# Patient Record
Sex: Male | Born: 1945 | Race: White | Hispanic: No | Smoking: Former smoker
Health system: Southern US, Community
[De-identification: ages and names within clinical notes are randomized; demographics above are authoritative.]

## PROBLEM LIST (undated history)

## (undated) DIAGNOSIS — F329 Major depressive disorder, single episode, unspecified: Secondary | ICD-10-CM

## (undated) DIAGNOSIS — I10 Essential (primary) hypertension: Secondary | ICD-10-CM

## (undated) DIAGNOSIS — K219 Gastro-esophageal reflux disease without esophagitis: Secondary | ICD-10-CM

## (undated) DIAGNOSIS — E785 Hyperlipidemia, unspecified: Secondary | ICD-10-CM

## (undated) DIAGNOSIS — R51 Headache: Secondary | ICD-10-CM

## (undated) DIAGNOSIS — G43909 Migraine, unspecified, not intractable, without status migrainosus: Secondary | ICD-10-CM

## (undated) DIAGNOSIS — M1990 Unspecified osteoarthritis, unspecified site: Secondary | ICD-10-CM

## (undated) DIAGNOSIS — T7840XA Allergy, unspecified, initial encounter: Secondary | ICD-10-CM

## (undated) DIAGNOSIS — J329 Chronic sinusitis, unspecified: Secondary | ICD-10-CM

## (undated) DIAGNOSIS — J45909 Unspecified asthma, uncomplicated: Secondary | ICD-10-CM

## (undated) DIAGNOSIS — N400 Enlarged prostate without lower urinary tract symptoms: Secondary | ICD-10-CM

## (undated) DIAGNOSIS — F32A Depression, unspecified: Secondary | ICD-10-CM

## (undated) HISTORY — DX: Chronic sinusitis, unspecified: J32.9

## (undated) HISTORY — DX: Unspecified osteoarthritis, unspecified site: M19.90

## (undated) HISTORY — DX: Hyperlipidemia, unspecified: E78.5

## (undated) HISTORY — DX: Major depressive disorder, single episode, unspecified: F32.9

## (undated) HISTORY — DX: Headache: R51

## (undated) HISTORY — DX: Depression, unspecified: F32.A

## (undated) HISTORY — DX: Unspecified asthma, uncomplicated: J45.909

## (undated) HISTORY — DX: Essential (primary) hypertension: I10

## (undated) HISTORY — DX: Gastro-esophageal reflux disease without esophagitis: K21.9

## (undated) HISTORY — DX: Benign prostatic hyperplasia without lower urinary tract symptoms: N40.0

## (undated) HISTORY — DX: Migraine, unspecified, not intractable, without status migrainosus: G43.909

## (undated) HISTORY — DX: Allergy, unspecified, initial encounter: T78.40XA

---
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
## 2003-10-16 HISTORY — PX: BACK SURGERY: SHX140

## 2011-07-09 ENCOUNTER — Ambulatory Visit (INDEPENDENT_AMBULATORY_CARE_PROVIDER_SITE_OTHER): Payer: Medicare Other | Admitting: Internal Medicine

## 2011-07-09 ENCOUNTER — Encounter: Payer: Self-pay | Admitting: Internal Medicine

## 2011-07-09 VITALS — BP 118/82 | HR 76 | Temp 98.2°F | Ht 70.0 in | Wt 258.2 lb

## 2011-07-09 DIAGNOSIS — E785 Hyperlipidemia, unspecified: Secondary | ICD-10-CM | POA: Insufficient documentation

## 2011-07-09 DIAGNOSIS — G8929 Other chronic pain: Secondary | ICD-10-CM | POA: Insufficient documentation

## 2011-07-09 DIAGNOSIS — R972 Elevated prostate specific antigen [PSA]: Secondary | ICD-10-CM | POA: Insufficient documentation

## 2011-07-09 DIAGNOSIS — E119 Type 2 diabetes mellitus without complications: Secondary | ICD-10-CM | POA: Insufficient documentation

## 2011-07-09 DIAGNOSIS — M549 Dorsalgia, unspecified: Secondary | ICD-10-CM

## 2011-07-09 HISTORY — DX: Elevated prostate specific antigen (PSA): R97.20

## 2011-07-09 LAB — COMPREHENSIVE METABOLIC PANEL
ALT: 41 U/L (ref 0–53)
CO2: 26 mEq/L (ref 19–32)
Calcium: 9.6 mg/dL (ref 8.4–10.5)
Chloride: 102 mEq/L (ref 96–112)
GFR: 75.34 mL/min (ref >60.00)
Sodium: 141 mEq/L (ref 135–145)
Total Protein: 7.7 g/dL (ref 6.0–8.3)

## 2011-07-09 LAB — HEMOGLOBIN A1C: Hgb A1c MFr Bld: 6.7 % — ABNORMAL HIGH (ref 4.6–6.5)

## 2011-07-09 LAB — LDL CHOLESTEROL, DIRECT: Direct LDL: 91.3 mg/dL

## 2011-07-09 MED ORDER — GLIMEPIRIDE 4 MG PO TABS: 4.0000 mg | ORAL_TABLET | Freq: Every day | ORAL | Status: DC

## 2011-07-09 NOTE — Assessment & Plan Note (Signed)
H/o elevated PSA in past, will check with labs today.

## 2011-07-09 NOTE — Assessment & Plan Note (Signed)
Will get records on previous evaluation and management.

## 2011-07-09 NOTE — Assessment & Plan Note (Signed)
Will check lipids and LFTs with labs. Follow up 3 months.

## 2011-07-09 NOTE — Assessment & Plan Note (Signed)
Blood sugars well-controlled per patient. Will check A1c with labs. On ACE inhibitor and statin. Eye exam and foot exam are up-to-date. Followup 3 months.

## 2011-07-09 NOTE — Progress Notes (Signed)
Subjective:    Patient ID: Dakota Gonzalez, male    DOB: 20-Aug-1945, 66 y.o.   MRN: [id number]  HPI 66 year old male with history of diabetes, hypertension, hyperlipidemia presents to establish care. He reports that he is generally feeling well. In regards to his diabetes, he reports that blood sugars are well controlled with current medications. He did not bring a record of his blood sugars today. He is unsure of his last hemoglobin A1c. He reports full compliance with his medications.  He also notes a recent history of elevated PSA. He notes that he was supposed to have this repeated to recheck level.  Outpatient Encounter Prescriptions as of 07/09/2011  Medication Sig Dispense Refill  . aspirin 81 MG tablet Take 81 mg by mouth daily.      . Coenzyme Q10 (COQ-10) 100 MG CAPS Take 1 tablet by mouth daily.      Marland Kitchen glimepiride (AMARYL) 4 MG tablet Take 1 tablet (4 mg total) by mouth daily before breakfast.  90 tablet  4  . HYDROcodone-acetaminophen (VICODIN) 5-500 MG per tablet Take 1 tablet by mouth every 6 (six) hours as needed.      Marland Kitchen lisinopril (PRINIVIL,ZESTRIL) 40 MG tablet Take 40 mg by mouth daily.      . Multiple Vitamins-Minerals (CENTRUM SILVER PO) Take 1 tablet by mouth daily.      . Probiotic Product (PROBIOTIC + OMEGA-3 PO) Take 1 tablet by mouth daily.      . simvastatin (ZOCOR) 40 MG tablet Take 40 mg by mouth every evening.        Review of Systems  Constitutional: Negative for fever, chills, activity change, appetite change, fatigue and unexpected weight change.  Eyes: Negative for visual disturbance.  Respiratory: Negative for cough and shortness of breath.   Cardiovascular: Negative for chest pain, palpitations and leg swelling.  Gastrointestinal: Negative for abdominal pain and abdominal distention.  Genitourinary: Negative for dysuria, urgency and difficulty urinating.  Musculoskeletal: Negative for arthralgias and gait problem.  Skin: Negative for color change and  rash.  Hematological: Negative for adenopathy.  Psychiatric/Behavioral: Negative for disturbed wake/sleep cycle and dysphoric mood. The patient is not nervous/anxious.        Objective:   Physical Exam  Constitutional: He is oriented to person, place, and time. He appears well-developed and well-nourished. No distress.  HENT:  Head: Normocephalic and atraumatic.  Right Ear: External ear normal.  Left Ear: External ear normal.  Nose: Nose normal.  Mouth/Throat: Oropharynx is clear and moist. No oropharyngeal exudate.  Eyes: Conjunctivae and EOM are normal. Pupils are equal, round, and reactive to light. Right eye exhibits no discharge. Left eye exhibits no discharge. No scleral icterus.  Neck: Normal range of motion. Neck supple. No tracheal deviation present. No thyromegaly present.  Cardiovascular: Normal rate, regular rhythm and normal heart sounds.  Exam reveals no gallop and no friction rub.   No murmur heard. Pulmonary/Chest: Effort normal and breath sounds normal. No respiratory distress. He has no wheezes. He has no rales. He exhibits no tenderness.  Abdominal: Soft. Bowel sounds are normal. He exhibits no distension and no mass. There is no tenderness. There is no guarding.  Musculoskeletal: Normal range of motion. He exhibits no edema.  Lymphadenopathy:    He has no cervical adenopathy.  Neurological: He is alert and oriented to person, place, and time. No cranial nerve deficit. Coordination normal.  Skin: Skin is warm and dry. No rash noted. He is not diaphoretic. No erythema. No  pallor.  Psychiatric: He has a normal mood and affect. His behavior is normal. Judgment and thought content normal.          Assessment & Plan:

## 2011-07-31 ENCOUNTER — Other Ambulatory Visit: Payer: Self-pay | Admitting: *Deleted

## 2011-07-31 MED ORDER — GLUCOSE BLOOD VI STRP: ORAL_STRIP | Status: DC

## 2011-09-06 ENCOUNTER — Other Ambulatory Visit: Payer: Self-pay | Admitting: *Deleted

## 2011-09-06 MED ORDER — SIMVASTATIN 40 MG PO TABS: 40.0000 mg | ORAL_TABLET | Freq: Every evening | ORAL | Status: DC

## 2011-10-17 ENCOUNTER — Encounter: Payer: Self-pay | Admitting: Internal Medicine

## 2011-10-17 ENCOUNTER — Ambulatory Visit (INDEPENDENT_AMBULATORY_CARE_PROVIDER_SITE_OTHER): Payer: Medicare Other | Admitting: Internal Medicine

## 2011-10-17 VITALS — BP 130/80 | HR 65 | Temp 97.9°F | Ht 70.0 in | Wt 242.8 lb

## 2011-10-17 DIAGNOSIS — Z0000 Encounter for general adult medical examination without abnormal findings: Secondary | ICD-10-CM

## 2011-10-17 DIAGNOSIS — R17 Unspecified jaundice: Secondary | ICD-10-CM

## 2011-10-17 DIAGNOSIS — Z23 Encounter for immunization: Secondary | ICD-10-CM

## 2011-10-17 DIAGNOSIS — E119 Type 2 diabetes mellitus without complications: Secondary | ICD-10-CM

## 2011-10-17 DIAGNOSIS — R972 Elevated prostate specific antigen [PSA]: Secondary | ICD-10-CM

## 2011-10-17 LAB — COMPREHENSIVE METABOLIC PANEL
ALT: 34 U/L (ref 0–53)
Albumin: 4.4 g/dL (ref 3.5–5.2)
BUN: 16 mg/dL (ref 6–23)
CO2: 29 mEq/L (ref 19–32)
Calcium: 9.8 mg/dL (ref 8.4–10.5)
Chloride: 104 mEq/L (ref 96–112)
Creatinine, Ser: 1.1 mg/dL (ref 0.4–1.5)
GFR: 74.45 mL/min (ref >60.00)
Potassium: 4.2 mEq/L (ref 3.5–5.1)

## 2011-10-17 LAB — HEMOGLOBIN A1C: Hgb A1c MFr Bld: 5.9 % (ref 4.6–6.5)

## 2011-10-17 NOTE — Progress Notes (Signed)
Subjective:    Patient ID: Dakota Gonzalez, male    DOB: 26-Mar-1945, 66 y.o.   MRN: [id number]  HPI The patient is here for annual Medicare wellness examination and management of other chronic and acute problems.   The risk factors are reflected in the social history.  The roster of all physicians providing medical care to patient - is listed in the Snapshot section of the chart.  Activities of daily living:  The patient is 100% independent in all ADLs: dressing, toileting, feeding as well as independent mobility  Home safety : The patient has smoke detectors in the home. They wear seatbelts.  There are no firearms at home. There is no violence in the home.   There is no risks for hepatitis, STDs or HIV. There is no history of blood transfusion. They have no travel history to infectious disease endemic areas of the world.  The patient has not seen their dentist in the last six month.  Encouraged pt to establish care with dentist. They have seen their eye doctor in the last year. ([facility] Fransico Michael at [facility]) No issues with hearing They have deferred audiologic testing in the last year.    They do not  have excessive sun exposure. Discussed the need for sun protection: hats, long sleeves and use of sunscreen if there is significant sun exposure.   Diet: the importance of a healthy diet is discussed. They do have a healthy diet.  The benefits of regular aerobic exercise were discussed. He uses cardio machines at home on regular basis, and walks.  Depression screen: there are no signs or vegative symptoms of depression- irritability, change in appetite, anhedonia, sadness/tearfullness.  Cognitive assessment: the patient manages all their financial and personal affairs and is actively engaged. They could relate day,date,year and events.  The following portions of the patient's history were reviewed and updated as appropriate: allergies, current medications, past family history, past  medical history,  past surgical history, past social history  and problem list.  Visual acuity was not assessed per patient preference since he has regular follow up with her ophthalmologist. Hearing and body mass index were assessed and reviewed.   During the course of the visit the patient was educated and counseled about appropriate screening and preventive services including : fall prevention , diabetes screening, nutrition counseling, colorectal cancer screening, and recommended immunizations.    In regards to diabetes, patient reports that blood sugars have been lower since he has lost nearly 20 pounds. He occasionally has blood sugars less than 60. He has been taking his glyburide 4 mg daily.   Outpatient Encounter Prescriptions as of 10/17/2011  Medication Sig Dispense Refill  . aspirin 81 MG tablet Take 81 mg by mouth daily.      . Coenzyme Q10 (COQ-10) 100 MG CAPS Take 1 tablet by mouth daily.      Marland Kitchen glimepiride (AMARYL) 4 MG tablet Take 1 tablet (4 mg total) by mouth daily before breakfast.  90 tablet  4  . glucose blood test strip One Touch Ultra Blue Test Strips- use one twice daily as needed to check blood glucose level.  50 each  11  . HYDROcodone-acetaminophen (VICODIN) 5-500 MG per tablet Take 1 tablet by mouth every 6 (six) hours as needed.      Marland Kitchen lisinopril (PRINIVIL,ZESTRIL) 40 MG tablet Take 40 mg by mouth daily.      . Multiple Vitamins-Minerals (CENTRUM SILVER PO) Take 1 tablet by mouth daily.      Marland Kitchen  Probiotic Product (PROBIOTIC + OMEGA-3 PO) Take 1 tablet by mouth daily.      . simvastatin (ZOCOR) 40 MG tablet Take 1 tablet (40 mg total) by mouth every evening.  90 tablet  3   BP 130/80  Pulse 65  Temp 97.9 F (36.6 C) (Oral)  Ht 5\' 10"  (1.778 m)  Wt 242 lb 12 oz (110.111 kg)  BMI 34.83 kg/m2  SpO2 96%  Review of Systems  Constitutional: Negative for fever, chills, activity change, appetite change, fatigue and unexpected weight change.  Eyes: Negative for visual  disturbance.  Respiratory: Negative for cough and shortness of breath.   Cardiovascular: Negative for chest pain, palpitations and leg swelling.  Gastrointestinal: Negative for abdominal pain and abdominal distention.  Genitourinary: Negative for dysuria, urgency and difficulty urinating.  Musculoskeletal: Negative for arthralgias and gait problem.  Skin: Negative for color change and rash.  Hematological: Negative for adenopathy.  Psychiatric/Behavioral: Negative for disturbed wake/sleep cycle and dysphoric mood. The patient is not nervous/anxious.        Objective:   Physical Exam  Constitutional: He is oriented to person, place, and time. He appears well-developed and well-nourished. No distress.  HENT:  Head: Normocephalic and atraumatic.  Right Ear: External ear normal.  Left Ear: External ear normal.  Nose: Nose normal.  Mouth/Throat: Oropharynx is clear and moist. No oropharyngeal exudate.  Eyes: Conjunctivae normal and EOM are normal. Pupils are equal, round, and reactive to light. Right eye exhibits no discharge. Left eye exhibits no discharge. No scleral icterus.  Neck: Normal range of motion. Neck supple. No tracheal deviation present. No thyromegaly present.  Cardiovascular: Normal rate, regular rhythm and normal heart sounds.  Exam reveals no gallop and no friction rub.   No murmur heard. Pulmonary/Chest: Effort normal and breath sounds normal. No respiratory distress. He has no wheezes. He has no rales. He exhibits no tenderness.  Abdominal: Soft. Bowel sounds are normal. He exhibits no distension and no mass. There is no tenderness. There is no rebound and no guarding.  Musculoskeletal: Normal range of motion. He exhibits no edema.  Lymphadenopathy:    He has no cervical adenopathy.  Neurological: He is alert and oriented to person, place, and time. No cranial nerve deficit. Coordination normal.  Skin: Skin is warm and dry. No rash noted. He is not diaphoretic. No  erythema. No pallor.  Psychiatric: He has a normal mood and affect. His behavior is normal. Judgment and thought content normal.          Assessment & Plan:

## 2011-10-17 NOTE — Addendum Note (Signed)
Addended by: Ronna Polio A on: 10/17/2011 06:55 PM   Modules accepted: Orders

## 2011-10-17 NOTE — Addendum Note (Signed)
Addended by: Gilmer Mor on: 10/17/2011 01:48 PM   Modules accepted: Orders

## 2011-10-17 NOTE — Assessment & Plan Note (Signed)
Blood sugars have been well-controlled. Will continue glyburide, but may reduce dose to 2 mg daily if patient has persistent low sugars less than 60. Will check A1c with labs today.

## 2011-10-17 NOTE — Assessment & Plan Note (Signed)
General exam normal today. Health care maintenance is UTD except for flu and pneumonia vaccines which were given. Information given on living will and HCPOA.  Recommended that pt establish care with dentist locally. Will check CMP and A1c today as well as PSA as this was slightly elevated on check in 06/2011. Follow up 3 months and prn.

## 2011-10-17 NOTE — Assessment & Plan Note (Signed)
PSA elevated at 4.3 in 06/2011.  This is consistent with previous levels. Will plan to repeat PSA today.

## 2011-10-18 LAB — PSA, TOTAL AND FREE
PSA, Free Pct: 31 % (ref >25)
PSA, Free: 1.69 ng/mL

## 2011-10-24 ENCOUNTER — Ambulatory Visit: Payer: Self-pay | Admitting: Internal Medicine

## 2011-10-25 ENCOUNTER — Telehealth: Payer: Self-pay | Admitting: Internal Medicine

## 2011-10-25 DIAGNOSIS — K7689 Other specified diseases of liver: Secondary | ICD-10-CM

## 2011-10-25 DIAGNOSIS — R935 Abnormal findings on diagnostic imaging of other abdominal regions, including retroperitoneum: Secondary | ICD-10-CM

## 2011-10-25 NOTE — Telephone Encounter (Signed)
Ultrasound of the right upper quadrant showed possible cyst in the liver. I would like to get CT abdomen for further evaluation. If pt agrees, we can set this up.

## 2011-10-25 NOTE — Telephone Encounter (Signed)
Patient advised as instructed via telephone, I will ask [name] to set up CT since Dakota Gonzalez is out of the office today.

## 2011-10-25 NOTE — Addendum Note (Signed)
Addended by: Gilmer Mor on: 10/25/2011 01:53 PM   Modules accepted: Orders

## 2011-10-25 NOTE — Addendum Note (Signed)
Addended by: Ronna Polio A on: 10/25/2011 02:27 PM   Modules accepted: Orders

## 2011-10-29 ENCOUNTER — Telehealth: Payer: Self-pay | Admitting: Internal Medicine

## 2011-10-29 NOTE — Telephone Encounter (Signed)
Pt called wanting to know about setting up a CT Scan ??

## 2011-10-30 ENCOUNTER — Telehealth: Payer: Self-pay | Admitting: Internal Medicine

## 2011-10-30 ENCOUNTER — Encounter: Payer: Self-pay | Admitting: Internal Medicine

## 2011-10-30 NOTE — Telephone Encounter (Signed)
Opened in error

## 2011-10-30 NOTE — Telephone Encounter (Signed)
Pt spouse called back checking on ct scan of pt liver. Best number to call [phone]

## 2011-11-01 NOTE — Telephone Encounter (Signed)
Spoke with patient via telephone, he stated that he has already had the ultrasound and he is aware that his CT is scheduled for 11/02/2011.

## 2011-11-02 ENCOUNTER — Ambulatory Visit: Payer: Self-pay | Admitting: Internal Medicine

## 2011-11-05 ENCOUNTER — Telehealth: Payer: Self-pay | Admitting: Internal Medicine

## 2011-11-05 NOTE — Telephone Encounter (Signed)
CT of the abdomen showed cysts in the liver and kidneys, but no acute abnormalities. There was diverticulosis noted in the sigmoid colon, but no acute inflammation

## 2011-11-07 ENCOUNTER — Encounter: Payer: Self-pay | Admitting: Internal Medicine

## 2011-11-21 ENCOUNTER — Telehealth: Payer: Self-pay | Admitting: Internal Medicine

## 2011-11-21 MED ORDER — HYDROCODONE-ACETAMINOPHEN 5-500 MG PO TABS: 1.0000 | ORAL_TABLET | Freq: Four times a day (QID) | ORAL | Status: DC | PRN

## 2011-11-21 NOTE — Telephone Encounter (Signed)
Pt is needing refill on his pain meds. He uses [facility] in [place].

## 2011-11-21 NOTE — Telephone Encounter (Signed)
We can refill Vicodin x 1 month.  He should know that Vicodin will be changing to schedule 2 narcotic per new FDA recommendations. I am no longer going to fill prescriptions for chronic narcotics.  He will need to establish care at a pain clinic for chronic narcotic management.

## 2011-11-21 NOTE — Telephone Encounter (Signed)
Rx called to [facility], patients spouse advised as instructed.  She will relay the message to Hershal and have him give [place] a call back if he wants [place] to set up appt with pain clinic.

## 2011-12-05 ENCOUNTER — Telehealth: Payer: Self-pay | Admitting: Internal Medicine

## 2011-12-05 NOTE — Telephone Encounter (Signed)
Refill request for lisinopril 40 mg  Sig: take one-half tablet by mouth twice daily Qty: 90

## 2011-12-08 ENCOUNTER — Encounter: Payer: Self-pay | Admitting: Internal Medicine

## 2011-12-10 ENCOUNTER — Other Ambulatory Visit: Payer: Self-pay | Admitting: General Practice

## 2011-12-10 ENCOUNTER — Other Ambulatory Visit: Payer: Self-pay | Admitting: Internal Medicine

## 2011-12-10 MED ORDER — LISINOPRIL 40 MG PO TABS: 40.0000 mg | ORAL_TABLET | Freq: Every day | ORAL | Status: DC

## 2012-01-16 HISTORY — PX: HEMANGIOMA EXCISION: SHX1734

## 2012-01-16 HISTORY — PX: PROSTATE BIOPSY: SHX241

## 2012-01-21 ENCOUNTER — Ambulatory Visit (INDEPENDENT_AMBULATORY_CARE_PROVIDER_SITE_OTHER): Payer: Medicare Other | Admitting: Internal Medicine

## 2012-01-21 ENCOUNTER — Encounter: Payer: Self-pay | Admitting: Internal Medicine

## 2012-01-21 VITALS — BP 140/80 | HR 84 | Temp 98.4°F | Ht 70.0 in | Wt 240.5 lb

## 2012-01-21 DIAGNOSIS — G2581 Restless legs syndrome: Secondary | ICD-10-CM

## 2012-01-21 DIAGNOSIS — E119 Type 2 diabetes mellitus without complications: Secondary | ICD-10-CM

## 2012-01-21 DIAGNOSIS — E785 Hyperlipidemia, unspecified: Secondary | ICD-10-CM

## 2012-01-21 DIAGNOSIS — R972 Elevated prostate specific antigen [PSA]: Secondary | ICD-10-CM

## 2012-01-21 LAB — COMPREHENSIVE METABOLIC PANEL
Albumin: 4.6 g/dL (ref 3.5–5.2)
Alkaline Phosphatase: 46 U/L (ref 39–117)
CO2: 28 mEq/L (ref 19–32)
GFR: 72.8 mL/min (ref >60.00)
Glucose, Bld: 116 mg/dL — ABNORMAL HIGH (ref 70–99)
Potassium: 4.3 mEq/L (ref 3.5–5.1)
Sodium: 140 mEq/L (ref 135–145)
Total Protein: 8.2 g/dL (ref 6.0–8.3)

## 2012-01-21 LAB — LIPID PANEL
Cholesterol: 143 mg/dL (ref 0–200)
LDL Cholesterol: 75 mg/dL (ref 0–99)

## 2012-01-21 LAB — HEMOGLOBIN A1C: Hgb A1c MFr Bld: 6.1 % (ref 4.6–6.5)

## 2012-01-21 LAB — MICROALBUMIN / CREATININE URINE RATIO: Creatinine,U: 161.6 mg/dL

## 2012-01-21 MED ORDER — GABAPENTIN 100 MG PO CAPS: 100.0000 mg | ORAL_CAPSULE | Freq: Every evening | ORAL | Status: DC | PRN

## 2012-01-21 NOTE — Progress Notes (Signed)
Subjective:    Patient ID: Dakota Gonzalez, male    DOB: Dec 20, 1945, 67 y.o.   MRN: [id number]  HPI 67 year old male with history of diabetes, obesity, hypertension, hyperlipidemia, diabetic neuropathy presents for followup. He reports that he is generally doing well. Blood sugars have been well-controlled. He reports compliance with his medications. He notes some dietary indiscretion over the holidays, but has been making effort to lose weight and has lost another 2 pounds. In regards to chronic back pain, he reports that symptoms have been controlled with use of Vicodin typically twice per month. He continues to have issues with restless legs in the evenings. This sometimes limits his ability to fall sleep.  Outpatient Encounter Prescriptions as of 01/21/2012  Medication Sig Dispense Refill  . aspirin 81 MG tablet Take 81 mg by mouth daily.      . Coenzyme Q10 (COQ-10) 100 MG CAPS Take 1 tablet by mouth daily.      Marland Kitchen glimepiride (AMARYL) 4 MG tablet Take 1 tablet (4 mg total) by mouth daily before breakfast.  90 tablet  4  . glucose blood test strip One Touch Ultra Blue Test Strips- use one twice daily as needed to check blood glucose level.  50 each  11  . HYDROcodone-acetaminophen (VICODIN) 5-500 MG per tablet Take 1 tablet by mouth every 6 (six) hours as needed.  30 tablet  0  . lisinopril (PRINIVIL,ZESTRIL) 40 MG tablet Take 1 tablet (40 mg total) by mouth daily.  30 tablet  6  . Multiple Vitamins-Minerals (CENTRUM SILVER PO) Take 1 tablet by mouth daily.      . Probiotic Product (PROBIOTIC + OMEGA-3 PO) Take 1 tablet by mouth daily.      . simvastatin (ZOCOR) 40 MG tablet Take 1 tablet (40 mg total) by mouth every evening.  90 tablet  3  . gabapentin (NEURONTIN) 100 MG capsule Take 1 capsule (100 mg total) by mouth at bedtime as needed.  30 capsule  3   BP 140/80  Pulse 84  Temp 98.4 F (36.9 C) (Oral)  Ht 5\' 10"  (1.778 m)  Wt 240 lb 8 oz (109.09 kg)  BMI 34.51 kg/m2  SpO2  96%  Review of Systems  Constitutional: Negative for fever, chills, activity change, appetite change, fatigue and unexpected weight change.  Eyes: Negative for visual disturbance.  Respiratory: Negative for cough and shortness of breath.   Cardiovascular: Negative for chest pain, palpitations and leg swelling.  Gastrointestinal: Negative for abdominal pain and abdominal distention.  Genitourinary: Negative for dysuria, urgency and difficulty urinating.  Musculoskeletal: Positive for myalgias, back pain and arthralgias. Negative for gait problem.  Skin: Negative for color change and rash.  Hematological: Negative for adenopathy.  Psychiatric/Behavioral: Negative for sleep disturbance and dysphoric mood. The patient is not nervous/anxious.        Objective:   Physical Exam  Constitutional: He is oriented to person, place, and time. He appears well-developed and well-nourished. No distress.  HENT:  Head: Normocephalic and atraumatic.  Right Ear: External ear normal.  Left Ear: External ear normal.  Nose: Nose normal.  Mouth/Throat: Oropharynx is clear and moist. No oropharyngeal exudate.  Eyes: Conjunctivae normal and EOM are normal. Pupils are equal, round, and reactive to light. Right eye exhibits no discharge. Left eye exhibits no discharge. No scleral icterus.  Neck: Normal range of motion. Neck supple. No tracheal deviation present. No thyromegaly present.  Cardiovascular: Normal rate, regular rhythm and normal heart sounds.  Exam reveals no  gallop and no friction rub.   No murmur heard. Pulmonary/Chest: Effort normal and breath sounds normal. No respiratory distress. He has no wheezes. He has no rales. He exhibits no tenderness.  Musculoskeletal: Normal range of motion. He exhibits no edema.  Lymphadenopathy:    He has no cervical adenopathy.  Neurological: He is alert and oriented to person, place, and time. No cranial nerve deficit. Coordination normal.  Skin: Skin is warm and  dry. No rash noted. He is not diaphoretic. No erythema. No pallor.  Psychiatric: He has a normal mood and affect. His behavior is normal. Judgment and thought content normal.          Assessment & Plan:

## 2012-01-21 NOTE — Assessment & Plan Note (Signed)
Will recheck PSA with labs today. 

## 2012-01-21 NOTE — Assessment & Plan Note (Signed)
Patient reports good control of blood sugars. Will check A1c with labs today. Continue Amaryl. Encouraged continued efforts at diet and exercise. Followup in 3 months or sooner as needed.

## 2012-01-21 NOTE — Assessment & Plan Note (Signed)
Symptoms persistent. Will try adding Neurontin at bedtime to see if any improvement. Pt will email with update later this week. Will likely need to titrate dose up over next few weeks.

## 2012-01-21 NOTE — Assessment & Plan Note (Signed)
Will check lipids and LFTs with labs today. Follow up in 3 months and prn.

## 2012-01-22 ENCOUNTER — Encounter: Payer: Self-pay | Admitting: Internal Medicine

## 2012-01-22 DIAGNOSIS — R972 Elevated prostate specific antigen [PSA]: Secondary | ICD-10-CM

## 2012-01-22 LAB — PSA, TOTAL AND FREE
PSA, Free: 1.91 ng/mL
PSA: 6.14 ng/mL — ABNORMAL HIGH (ref <=4.00)

## 2012-02-11 DIAGNOSIS — N411 Chronic prostatitis: Secondary | ICD-10-CM | POA: Insufficient documentation

## 2012-02-11 DIAGNOSIS — N138 Other obstructive and reflux uropathy: Secondary | ICD-10-CM | POA: Insufficient documentation

## 2012-02-11 DIAGNOSIS — R339 Retention of urine, unspecified: Secondary | ICD-10-CM | POA: Insufficient documentation

## 2012-02-25 ENCOUNTER — Encounter: Payer: Self-pay | Admitting: Adult Health

## 2012-02-25 ENCOUNTER — Ambulatory Visit (INDEPENDENT_AMBULATORY_CARE_PROVIDER_SITE_OTHER): Payer: Medicare Other | Admitting: Adult Health

## 2012-02-25 VITALS — BP 148/88 | HR 100 | Temp 98.2°F | Resp 14 | Wt 242.0 lb

## 2012-02-25 DIAGNOSIS — I10 Essential (primary) hypertension: Secondary | ICD-10-CM

## 2012-02-25 MED ORDER — HYDROCHLOROTHIAZIDE 25 MG PO TABS: 25.0000 mg | ORAL_TABLET | Freq: Every day | ORAL | Status: DC

## 2012-02-25 NOTE — Progress Notes (Signed)
  Subjective:    Patient ID: Dakota Gonzalez, male    DOB: [birth year], 67 y.o.   MRN: [id number]  HPI  Patient presents with c/o elevated B/P. His wife checked his b/p this morning and reports getting 178/104. He later had an appointment with Dr. Achilles Dunk and their office reported 163/102. He is here for evaluation of this.  He denies taking NSAIDS. Patient has recently had back surgery and has been experiencing pain. He reports "I just wait it out" regarding whether he take anything for pain. Mr. Maylon Cos states he wants to stop taking the Vicodin and has. His wife adds that he has been concerned lately about his elevation in his PSA which is one of the reasons he is being followed by Dr. Achilles Dunk.   Current Outpatient Prescriptions on File Prior to Visit  Medication Sig Dispense Refill  . aspirin 81 MG tablet Take 81 mg by mouth daily.      . Coenzyme Q10 (COQ-10) 100 MG CAPS Take 1 tablet by mouth daily.      Marland Kitchen gabapentin (NEURONTIN) 100 MG capsule Take 1 capsule (100 mg total) by mouth at bedtime as needed.  30 capsule  3  . glimepiride (AMARYL) 4 MG tablet Take 1 tablet (4 mg total) by mouth daily before breakfast.  90 tablet  4  . glucose blood test strip One Touch Ultra Blue Test Strips- use one twice daily as needed to check blood glucose level.  50 each  11  . lisinopril (PRINIVIL,ZESTRIL) 40 MG tablet Take 1 tablet (40 mg total) by mouth daily.  30 tablet  6  . Multiple Vitamins-Minerals (CENTRUM SILVER PO) Take 1 tablet by mouth daily.      . Probiotic Product (PROBIOTIC + OMEGA-3 PO) Take 1 tablet by mouth daily.      . simvastatin (ZOCOR) 40 MG tablet Take 1 tablet (40 mg total) by mouth every evening.  90 tablet  3  . HYDROcodone-acetaminophen (VICODIN) 5-500 MG per tablet Take 1 tablet by mouth every 6 (six) hours as needed.  30 tablet  0   No current facility-administered medications on file prior to visit.    Review of Systems  Constitutional: Negative.   Respiratory: Negative for  cough, chest tightness and shortness of breath.   Cardiovascular: Negative for chest pain.  Gastrointestinal: Negative.   Genitourinary: Negative for difficulty urinating.  Musculoskeletal: Positive for back pain.  Skin: Negative.   Neurological: Positive for headaches.  Psychiatric/Behavioral: The patient is nervous/anxious.         Objective:   Physical Exam  Constitutional: He is oriented to person, place, and time. He appears well-developed and well-nourished. No distress.  HENT:  Head: Normocephalic and atraumatic.  Cardiovascular: Normal rate, regular rhythm and normal heart sounds.  Exam reveals no gallop.   No murmur heard. Pulmonary/Chest: Effort normal and breath sounds normal.  Abdominal: Soft. Bowel sounds are normal.  Musculoskeletal:  Recent back surgery. Ambulates with cane at present.  Neurological: He is alert and oriented to person, place, and time.  Psychiatric: He has a normal mood and affect. His behavior is normal. Judgment and thought content normal.  Cooperative. Pleasant gentleman.          Assessment & Plan:

## 2012-02-25 NOTE — Patient Instructions (Addendum)
  Start HCTZ 25 mg daily for blood pressure.  Monitor your blood pressure for this week.  If b/p remains elevated, please schedule appointment within 1 week.

## 2012-02-26 ENCOUNTER — Encounter: Payer: Self-pay | Admitting: Adult Health

## 2012-02-26 DIAGNOSIS — I10 Essential (primary) hypertension: Secondary | ICD-10-CM | POA: Insufficient documentation

## 2012-02-26 NOTE — Assessment & Plan Note (Signed)
Continue lisinopril 40 mg daily. Add HCTZ 25 mg daily. Monitor b/p for 1 week following and report findings back to provider for further instruction. May take tylenol with neurontin for discomfort. Patient does not want to take narcotic medication for pain. Advised against NSAIDs such as advil, aleve, ibuprofen which can elevate b/p. Also discussed that pain itself can have an impact on b/p.

## 2012-04-14 ENCOUNTER — Encounter: Payer: Self-pay | Admitting: Internal Medicine

## 2012-04-18 ENCOUNTER — Encounter: Payer: Self-pay | Admitting: Internal Medicine

## 2012-04-18 MED ORDER — HYDROCHLOROTHIAZIDE 25 MG PO TABS: 25.0000 mg | ORAL_TABLET | Freq: Every day | ORAL | Status: DC

## 2012-04-30 ENCOUNTER — Encounter: Payer: Self-pay | Admitting: Internal Medicine

## 2012-04-30 ENCOUNTER — Ambulatory Visit (INDEPENDENT_AMBULATORY_CARE_PROVIDER_SITE_OTHER): Payer: Medicare Other | Admitting: Internal Medicine

## 2012-04-30 VITALS — BP 130/80 | HR 82 | Temp 98.2°F | Wt 245.0 lb

## 2012-04-30 DIAGNOSIS — E119 Type 2 diabetes mellitus without complications: Secondary | ICD-10-CM

## 2012-04-30 DIAGNOSIS — I10 Essential (primary) hypertension: Secondary | ICD-10-CM

## 2012-04-30 LAB — COMPREHENSIVE METABOLIC PANEL
ALT: 31 U/L (ref 0–53)
AST: 29 U/L (ref 0–37)
CO2: 28 mEq/L (ref 19–32)
Chloride: 100 mEq/L (ref 96–112)
Creatinine, Ser: 1.2 mg/dL (ref 0.4–1.5)
GFR: 65.04 mL/min (ref >60.00)
Sodium: 137 mEq/L (ref 135–145)
Total Bilirubin: 1.4 mg/dL — ABNORMAL HIGH (ref 0.3–1.2)
Total Protein: 8 g/dL (ref 6.0–8.3)

## 2012-04-30 MED ORDER — LISINOPRIL 40 MG PO TABS: 40.0000 mg | ORAL_TABLET | Freq: Every day | ORAL | Status: DC

## 2012-04-30 NOTE — Assessment & Plan Note (Signed)
Hydrochlorothiazide was recently added because of hypertension. Suspect this episode of hypertension was related to anxiety.  Patient now having hypotension with symptomatic lightheadedness. Will stop hydrochlorothiazide and continue lisinopril. He'll monitor blood pressures at home and e-mail with readings every week.

## 2012-04-30 NOTE — Assessment & Plan Note (Signed)
Per patient report blood sugars are well controlled. Will check A1c with labs today. Continue Amaryl.

## 2012-04-30 NOTE — Progress Notes (Signed)
Subjective:    Patient ID: Dakota Gonzalez, male    DOB: 1945/05/30, 67 y.o.   MRN: [id number]  HPI 67 year old male with history of hypertension, diabetes, hyperlipidemia, obesity presents for followup. He reports he is generally doing well. He reports blood sugars have been well-controlled however he did not bring record of blood sugars with him today. He is compliant with medications.  He was recently seen in the visit for elevated blood pressures, as high as 170s over 100s. He was started on hydrochlorothiazide. He reports that over the last few weeks blood pressures have been lower, typically 100-110 over 50s to 60s. He has felt lightheaded. He questions whether brief period of elevated blood pressures with secondary to increased anxiety related to ongoing issues with prostate screening. He denies any recent chest pain, headache, palpitations.  Outpatient Encounter Prescriptions as of 04/30/2012  Medication Sig Dispense Refill  . aspirin 81 MG tablet Take 81 mg by mouth daily.      . Coenzyme Q10 (COQ-10) 100 MG CAPS Take 1 tablet by mouth daily.      . finasteride (PROSCAR) 5 MG tablet Take 5 mg by mouth daily.      Marland Kitchen gabapentin (NEURONTIN) 100 MG capsule Take 1 capsule (100 mg total) by mouth at bedtime as needed.  30 capsule  3  . glimepiride (AMARYL) 4 MG tablet Take 1 tablet (4 mg total) by mouth daily before breakfast.  90 tablet  4  . glucose blood test strip One Touch Ultra Blue Test Strips- use one twice daily as needed to check blood glucose level.  50 each  11  . lisinopril (PRINIVIL,ZESTRIL) 40 MG tablet Take 1 tablet (40 mg total) by mouth daily.  90 tablet  4  . Multiple Vitamins-Minerals (CENTRUM SILVER PO) Take 1 tablet by mouth daily.      . Probiotic Product (PROBIOTIC + OMEGA-3 PO) Take 1 tablet by mouth daily.      . simvastatin (ZOCOR) 40 MG tablet Take 1 tablet (40 mg total) by mouth every evening.  90 tablet  3  . tamsulosin (FLOMAX) 0.4 MG CAPS Take 0.4 mg by mouth  daily.       No facility-administered encounter medications on file as of 04/30/2012.   BP 130/80  Pulse 82  Temp(Src) 98.2 F (36.8 C) (Oral)  Wt 245 lb (111.131 kg)  BMI 35.15 kg/m2  SpO2 96%  Review of Systems  Constitutional: Negative for fever, chills, activity change, appetite change, fatigue and unexpected weight change.  Eyes: Negative for visual disturbance.  Respiratory: Negative for cough and shortness of breath.   Cardiovascular: Negative for chest pain, palpitations and leg swelling.  Gastrointestinal: Negative for abdominal pain and abdominal distention.  Genitourinary: Negative for dysuria, urgency and difficulty urinating.  Musculoskeletal: Negative for arthralgias and gait problem.  Skin: Negative for color change and rash.  Neurological: Positive for light-headedness.  Hematological: Negative for adenopathy.  Psychiatric/Behavioral: Negative for sleep disturbance and dysphoric mood. The patient is not nervous/anxious.        Objective:   Physical Exam  Constitutional: He is oriented to person, place, and time. He appears well-developed and well-nourished. No distress.  HENT:  Head: Normocephalic and atraumatic.  Right Ear: External ear normal.  Left Ear: External ear normal.  Nose: Nose normal.  Mouth/Throat: Oropharynx is clear and moist. No oropharyngeal exudate.  Eyes: Conjunctivae and EOM are normal. Pupils are equal, round, and reactive to light. Right eye exhibits no discharge. Left eye  exhibits no discharge. No scleral icterus.  Neck: Normal range of motion. Neck supple. No tracheal deviation present. No thyromegaly present.  Cardiovascular: Normal rate, regular rhythm and normal heart sounds.  Exam reveals no gallop and no friction rub.   No murmur heard. Pulmonary/Chest: Effort normal and breath sounds normal. No accessory muscle usage. Not tachypneic. No respiratory distress. He has no decreased breath sounds. He has no wheezes. He has no rhonchi. He  has no rales. He exhibits no tenderness.  Musculoskeletal: Normal range of motion. He exhibits no edema.  Lymphadenopathy:    He has no cervical adenopathy.  Neurological: He is alert and oriented to person, place, and time. No cranial nerve deficit. Coordination normal.  Skin: Skin is warm and dry. No rash noted. He is not diaphoretic. No erythema. No pallor.  Psychiatric: He has a normal mood and affect. His behavior is normal. Judgment and thought content normal.          Assessment & Plan:

## 2012-05-12 ENCOUNTER — Encounter: Payer: Self-pay | Admitting: Internal Medicine

## 2012-05-12 DIAGNOSIS — G2581 Restless legs syndrome: Secondary | ICD-10-CM

## 2012-05-13 MED ORDER — GABAPENTIN 100 MG PO CAPS: 100.0000 mg | ORAL_CAPSULE | Freq: Every evening | ORAL | Status: DC | PRN

## 2012-05-20 ENCOUNTER — Encounter: Payer: Self-pay | Admitting: Internal Medicine

## 2012-07-28 ENCOUNTER — Other Ambulatory Visit: Payer: Self-pay | Admitting: *Deleted

## 2012-07-28 DIAGNOSIS — E119 Type 2 diabetes mellitus without complications: Secondary | ICD-10-CM

## 2012-07-28 MED ORDER — GLIMEPIRIDE 4 MG PO TABS: 4.0000 mg | ORAL_TABLET | Freq: Every day | ORAL | Status: DC

## 2012-07-28 NOTE — Telephone Encounter (Signed)
Eprescribed.

## 2012-08-06 ENCOUNTER — Ambulatory Visit (INDEPENDENT_AMBULATORY_CARE_PROVIDER_SITE_OTHER): Payer: Medicare Other | Admitting: Internal Medicine

## 2012-08-06 ENCOUNTER — Encounter: Payer: Self-pay | Admitting: Internal Medicine

## 2012-08-06 VITALS — BP 148/90 | HR 78 | Temp 98.2°F | Wt 246.0 lb

## 2012-08-06 DIAGNOSIS — R972 Elevated prostate specific antigen [PSA]: Secondary | ICD-10-CM

## 2012-08-06 DIAGNOSIS — I10 Essential (primary) hypertension: Secondary | ICD-10-CM

## 2012-08-06 DIAGNOSIS — E119 Type 2 diabetes mellitus without complications: Secondary | ICD-10-CM

## 2012-08-06 LAB — COMPREHENSIVE METABOLIC PANEL
ALT: 29 U/L (ref 0–53)
Albumin: 4.6 g/dL (ref 3.5–5.2)
CO2: 26 mEq/L (ref 19–32)
Calcium: 10.1 mg/dL (ref 8.4–10.5)
Chloride: 102 mEq/L (ref 96–112)
GFR: 81.31 mL/min (ref >60.00)
Glucose, Bld: 109 mg/dL — ABNORMAL HIGH (ref 70–99)
Sodium: 138 mEq/L (ref 135–145)
Total Protein: 7.7 g/dL (ref 6.0–8.3)

## 2012-08-06 LAB — HM DIABETES FOOT EXAM: HM Diabetic Foot Exam: NORMAL

## 2012-08-06 LAB — MICROALBUMIN / CREATININE URINE RATIO: Microalb, Ur: 1.4 mg/dL (ref 0.0–1.9)

## 2012-08-06 NOTE — Assessment & Plan Note (Signed)
BP Readings from Last 3 Encounters:  08/06/12 148/90  04/30/12 130/80  02/25/12 148/88   BP slightly elevated today, however has been better controlled at home. Will continue to monitor. Patient will call if blood pressure consistently greater than 140/90. Continue lisinopril.

## 2012-08-06 NOTE — Assessment & Plan Note (Signed)
Patient recently seen by urology. Reports PSA was decreased at 4. Will request records.

## 2012-08-06 NOTE — Assessment & Plan Note (Signed)
Lab Results  Component Value Date   HGBA1C 6.6* 04/30/2012   Patient reports good control of blood sugar. Will check A1c with labs today. Continue glimepiride.

## 2012-08-06 NOTE — Progress Notes (Signed)
Subjective:    Patient ID: Dakota Gonzalez, male    DOB: [birth year], 67 y.o.   MRN: [id number]  HPI 67 year old male with history of diabetes, hypertension, obesity presents for followup. He reports he is generally feeling well. He reports blood sugars have been well-controlled. He did not bring record with him today. He is compliant with medications. He has been physically active both exercising by walking on a treadmill and participating in physical activity with discharge. He notes he was recently seen by urology and repeat PSA level was improved at 4. He denies any new concerns today.  Outpatient Encounter Prescriptions as of 08/06/2012  Medication Sig Dispense Refill  . aspirin 81 MG tablet Take 81 mg by mouth daily.      . Coenzyme Q10 (COQ-10) 100 MG CAPS Take 1 tablet by mouth daily.      . finasteride (PROSCAR) 5 MG tablet Take 5 mg by mouth daily.      Marland Kitchen gabapentin (NEURONTIN) 100 MG capsule Take 1-2 capsules (100-200 mg total) by mouth at bedtime as needed.  180 capsule  3  . glimepiride (AMARYL) 4 MG tablet Take 1 tablet (4 mg total) by mouth daily before breakfast.  90 tablet  1  . glucose blood test strip One Touch Ultra Blue Test Strips- use one twice daily as needed to check blood glucose level.  50 each  11  . lisinopril (PRINIVIL,ZESTRIL) 40 MG tablet Take 1 tablet (40 mg total) by mouth daily.  90 tablet  4  . Multiple Vitamins-Minerals (CENTRUM SILVER PO) Take 1 tablet by mouth daily.      . simvastatin (ZOCOR) 40 MG tablet Take 1 tablet (40 mg total) by mouth every evening.  90 tablet  3  . tamsulosin (FLOMAX) 0.4 MG CAPS Take 0.4 mg by mouth daily.      . Probiotic Product (PROBIOTIC + OMEGA-3 PO) Take 1 tablet by mouth daily.      . [DISCONTINUED] HYDROcodone-acetaminophen (VICODIN) 5-500 MG per tablet Take 1 tablet by mouth every 6 (six) hours as needed.  30 tablet  0   No facility-administered encounter medications on file as of 08/06/2012.   BP 148/90  Pulse 78   Temp(Src) 98.2 F (36.8 C) (Oral)  Wt 246 lb (111.585 kg)  BMI 35.3 kg/m2  SpO2 94%  Review of Systems  Constitutional: Negative for fever, chills, activity change, appetite change, fatigue and unexpected weight change.  Eyes: Negative for visual disturbance.  Respiratory: Negative for cough and shortness of breath.   Cardiovascular: Negative for chest pain, palpitations and leg swelling.  Gastrointestinal: Negative for abdominal pain and abdominal distention.  Genitourinary: Negative for dysuria, urgency and difficulty urinating.  Musculoskeletal: Negative for arthralgias and gait problem.  Skin: Negative for color change and rash.  Hematological: Negative for adenopathy.  Psychiatric/Behavioral: Negative for sleep disturbance and dysphoric mood. The patient is not nervous/anxious.        Objective:   Physical Exam  Constitutional: He is oriented to person, place, and time. He appears well-developed and well-nourished. No distress.  HENT:  Head: Normocephalic and atraumatic.  Right Ear: External ear normal.  Left Ear: External ear normal.  Nose: Nose normal.  Mouth/Throat: Oropharynx is clear and moist. No oropharyngeal exudate.  Eyes: Conjunctivae and EOM are normal. Pupils are equal, round, and reactive to light. Right eye exhibits no discharge. Left eye exhibits no discharge. No scleral icterus.  Neck: Normal range of motion. Neck supple. No tracheal deviation present. No  thyromegaly present.  Cardiovascular: Normal rate, regular rhythm and normal heart sounds.  Exam reveals no gallop and no friction rub.   No murmur heard. Pulmonary/Chest: Effort normal and breath sounds normal. No respiratory distress. He has no wheezes. He has no rales. He exhibits no tenderness.  Musculoskeletal: Normal range of motion. He exhibits no edema.  Lymphadenopathy:    He has no cervical adenopathy.  Neurological: He is alert and oriented to person, place, and time. No cranial nerve deficit.  Coordination normal.  Skin: Skin is warm and dry. No rash noted. He is not diaphoretic. No erythema. No pallor.  Psychiatric: He has a normal mood and affect. His behavior is normal. Judgment and thought content normal.          Assessment & Plan:

## 2012-08-07 ENCOUNTER — Encounter: Payer: Self-pay | Admitting: Internal Medicine

## 2012-08-08 MED ORDER — DOXYCYCLINE HYCLATE 100 MG PO TABS: 100.0000 mg | ORAL_TABLET | Freq: Two times a day (BID) | ORAL | Status: DC

## 2012-09-04 ENCOUNTER — Other Ambulatory Visit: Payer: Self-pay | Admitting: *Deleted

## 2012-09-04 MED ORDER — SIMVASTATIN 40 MG PO TABS: 40.0000 mg | ORAL_TABLET | Freq: Every evening | ORAL | Status: DC

## 2012-09-22 ENCOUNTER — Ambulatory Visit (INDEPENDENT_AMBULATORY_CARE_PROVIDER_SITE_OTHER): Payer: Medicare Other | Admitting: *Deleted

## 2012-09-22 DIAGNOSIS — Z23 Encounter for immunization: Secondary | ICD-10-CM

## 2012-10-13 LAB — HM DIABETES EYE EXAM

## 2012-11-13 ENCOUNTER — Encounter: Payer: Self-pay | Admitting: *Deleted

## 2012-11-14 ENCOUNTER — Encounter: Payer: Self-pay | Admitting: Internal Medicine

## 2012-11-14 ENCOUNTER — Ambulatory Visit (INDEPENDENT_AMBULATORY_CARE_PROVIDER_SITE_OTHER): Payer: Medicare Other | Admitting: Internal Medicine

## 2012-11-14 VITALS — BP 138/88 | HR 93 | Temp 98.1°F | Ht 69.0 in | Wt 251.0 lb

## 2012-11-14 DIAGNOSIS — E119 Type 2 diabetes mellitus without complications: Secondary | ICD-10-CM

## 2012-11-14 DIAGNOSIS — D1800 Hemangioma unspecified site: Secondary | ICD-10-CM

## 2012-11-14 DIAGNOSIS — Z0000 Encounter for general adult medical examination without abnormal findings: Secondary | ICD-10-CM

## 2012-11-14 DIAGNOSIS — I10 Essential (primary) hypertension: Secondary | ICD-10-CM

## 2012-11-14 DIAGNOSIS — E785 Hyperlipidemia, unspecified: Secondary | ICD-10-CM

## 2012-11-14 DIAGNOSIS — R972 Elevated prostate specific antigen [PSA]: Secondary | ICD-10-CM

## 2012-11-14 LAB — COMPREHENSIVE METABOLIC PANEL
AST: 33 U/L (ref 0–37)
BUN: 11 mg/dL (ref 6–23)
CO2: 27 mEq/L (ref 19–32)
Calcium: 10.2 mg/dL (ref 8.4–10.5)
Chloride: 99 mEq/L (ref 96–112)
Creatinine, Ser: 1.1 mg/dL (ref 0.4–1.5)
GFR: 72.62 mL/min (ref >60.00)
Total Bilirubin: 1.7 mg/dL — ABNORMAL HIGH (ref 0.3–1.2)

## 2012-11-14 LAB — LIPID PANEL
Cholesterol: 167 mg/dL (ref 0–200)
HDL: 40.8 mg/dL (ref >39.00)
Triglycerides: 252 mg/dL — ABNORMAL HIGH (ref 0.0–149.0)

## 2012-11-14 LAB — LDL CHOLESTEROL, DIRECT: Direct LDL: 104.6 mg/dL

## 2012-11-14 NOTE — Assessment & Plan Note (Signed)
Lab Results  Component Value Date   HGBA1C 6.6* 11/14/2012   Blood sugars well-controlled on current medications. Will continue. Foot exam normal today. Followup 3 months or sooner as needed.

## 2012-11-14 NOTE — Assessment & Plan Note (Signed)
Lab Results  Component Value Date   LDLCALC 75 01/21/2012   Lipids well-controlled on simvastatin. We'll continue.

## 2012-11-14 NOTE — Progress Notes (Signed)
Subjective:    Patient ID: Dakota Gonzalez, male    DOB: 1945/08/16, 67 y.o.   MRN: [id number]  HPI The patient is here for annual Medicare wellness examination and management of other chronic and acute problems.   The risk factors are reflected in the social history.  The roster of all physicians providing medical care to patient - is listed in the Snapshot section of the chart.  Activities of daily living:  The patient is 100% independent in all ADLs: dressing, toileting, feeding as well as independent mobility  Home safety : The patient has smoke detectors in the home. They wear seatbelts.  There are no firearms at home. There is no violence in the home.   There is no risks for hepatitis, STDs or HIV. There is no history of blood transfusion. They have no travel history to infectious disease endemic areas of the world.  The patient has not seen their dentist in the last six month.  Encouraged pt to establish care with dentist. They have seen their eye doctor in the last year. (Dr. Fransico Michael at [facility]) No issues with hearing They have deferred audiologic testing in the last year.    They do not  have excessive sun exposure. Discussed the need for sun protection: hats, long sleeves and use of sunscreen if there is significant sun exposure. No dermatologist  Diet: the importance of a healthy diet is discussed. They do have a healthy diet.  The benefits of regular aerobic exercise were discussed. He uses cardio machines at home (elliptical) on regular basis, and walks.  Depression screen: there are no signs or vegative symptoms of depression- irritability, change in appetite, anhedonia, sadness/tearfullness.  Cognitive assessment: the patient manages all their financial and personal affairs and is actively engaged. They could relate day,date,year and events.  The following portions of the patient's history were reviewed and updated as appropriate: allergies, current medications,  past family history, past medical history,  past surgical history, past social history  and problem list.  Visual acuity was not assessed per patient preference since he has regular follow up with ophthalmologist. Hearing and body mass index were assessed and reviewed.   During the course of the visit the patient was educated and counseled about appropriate screening and preventive services including : fall prevention , diabetes screening, nutrition counseling, colorectal cancer screening, and recommended immunizations.    In regards to diabetes, patient reports that blood sugars have been well controlled. He has been taking his glyburide 4 mg daily.  Pt is concerned today about growth on right arm. Present for 6 months. "Bruise" at site has been present >40 years. Increased swelling recent after starting medication with urologist, finasteride. Non-tender. No h/o trauma at site.  Outpatient Encounter Prescriptions as of 11/14/2012  Medication Sig  . aspirin 81 MG tablet Take 81 mg by mouth daily.  . Coenzyme Q10 (COQ-10) 100 MG CAPS Take 1 tablet by mouth daily.  . finasteride (PROSCAR) 5 MG tablet Take 5 mg by mouth daily.  Marland Kitchen gabapentin (NEURONTIN) 100 MG capsule Take 1-2 capsules (100-200 mg total) by mouth at bedtime as needed.  Marland Kitchen glimepiride (AMARYL) 4 MG tablet Take 1 tablet (4 mg total) by mouth daily before breakfast.  . glucose blood test strip One Touch Ultra Blue Test Strips- use one twice daily as needed to check blood glucose level.  Marland Kitchen lisinopril (PRINIVIL,ZESTRIL) 40 MG tablet Take 1 tablet (40 mg total) by mouth daily.  . Multiple Vitamins-Minerals (CENTRUM  SILVER PO) Take 1 tablet by mouth daily.  . simvastatin (ZOCOR) 40 MG tablet Take 1 tablet (40 mg total) by mouth every evening.  . tamsulosin (FLOMAX) 0.4 MG CAPS Take 0.4 mg by mouth daily.  . Probiotic Product (PROBIOTIC + OMEGA-3 PO) Take 1 tablet by mouth daily.      Review of Systems  Constitutional: Negative for  fever, chills, activity change, appetite change, fatigue and unexpected weight change.  Eyes: Negative for visual disturbance.  Respiratory: Negative for cough and shortness of breath.   Cardiovascular: Negative for chest pain, palpitations and leg swelling.  Gastrointestinal: Negative for abdominal pain and abdominal distention.  Genitourinary: Negative for dysuria, urgency and difficulty urinating.  Musculoskeletal: Negative for arthralgias and gait problem.  Skin: Positive for color change. Negative for rash.  Hematological: Negative for adenopathy.  Psychiatric/Behavioral: Negative for sleep disturbance and dysphoric mood. The patient is not nervous/anxious.        Objective:   Physical Exam  Constitutional: He is oriented to person, place, and time. He appears well-developed and well-nourished. No distress.  HENT:  Head: Normocephalic and atraumatic.  Right Ear: External ear normal.  Left Ear: External ear normal.  Nose: Nose normal.  Mouth/Throat: Oropharynx is clear and moist. No oropharyngeal exudate.  Eyes: Conjunctivae and EOM are normal. Pupils are equal, round, and reactive to light. Right eye exhibits no discharge. Left eye exhibits no discharge. No scleral icterus.  Neck: Normal range of motion. Neck supple. No tracheal deviation present. No thyromegaly present.  Cardiovascular: Normal rate, regular rhythm and normal heart sounds.  Exam reveals no gallop and no friction rub.   No murmur heard. Pulmonary/Chest: Effort normal and breath sounds normal. No respiratory distress. He has no wheezes. He has no rales. He exhibits no tenderness.  Abdominal: Soft. Bowel sounds are normal. He exhibits no distension and no mass. There is no tenderness. There is no rebound and no guarding.  Musculoskeletal: Normal range of motion. He exhibits no edema.  Lymphadenopathy:    He has no cervical adenopathy.  Neurological: He is alert and oriented to person, place, and time. No cranial  nerve deficit. Coordination normal.  Skin: Skin is warm and dry. No rash noted. He is not diaphoretic. No erythema. No pallor.     Psychiatric: He has a normal mood and affect. His behavior is normal. Judgment and thought content normal.          Assessment & Plan:

## 2012-11-14 NOTE — Assessment & Plan Note (Signed)
BP Readings from Last 3 Encounters:  11/14/12 138/88  08/06/12 148/90  04/30/12 130/80   Blood pressure generally well controlled on lisinopril. Renal function normal today. Followup in 3 months.

## 2012-11-14 NOTE — Assessment & Plan Note (Signed)
General medical exam normal today except as noted. Immunizations are up to date. Again recommended that patient establish care with a dentist. Will check CMP, A1c, lipid profile with labs today. PSA is now followed by his urologist.

## 2012-11-14 NOTE — Assessment & Plan Note (Signed)
Large nodular lesion on the right upper arm most consistent with hemangioma. Given this recently appeared approximately 6 months ago, will set up vascular evaluation with [place] to confirm this is a hemangioma.

## 2012-12-02 ENCOUNTER — Telehealth: Payer: Self-pay | Admitting: Internal Medicine

## 2012-12-02 NOTE — Telephone Encounter (Signed)
Recent vascular evaluation was normal. However, they did see a solid area int he right upper arm. Was he able to talk with the surgeon? If the vascular surgeon did not evaluate him, then I would like to set up a visit with general surgery.

## 2012-12-04 NOTE — Telephone Encounter (Signed)
Spoke with patient, informed of the results and he did see the surgeon. He will be having surgery on 12/19/12

## 2012-12-04 NOTE — Telephone Encounter (Signed)
Amber, Can we request surgeons notes from [facility]? Thanks

## 2012-12-05 NOTE — Telephone Encounter (Signed)
Requested.

## 2012-12-15 ENCOUNTER — Ambulatory Visit: Payer: Self-pay | Admitting: Vascular Surgery

## 2012-12-15 LAB — URINALYSIS, COMPLETE
Bacteria: NONE SEEN
Ketone: NEGATIVE
Nitrite: NEGATIVE
Ph: 5 (ref 4.5–8.0)
Protein: NEGATIVE
WBC UR: <1 /HPF (ref 0–5)

## 2012-12-15 LAB — BASIC METABOLIC PANEL
Anion Gap: 4 — ABNORMAL LOW (ref 7–16)
Calcium, Total: 9.5 mg/dL (ref 8.5–10.1)
Chloride: 107 mmol/L (ref 98–107)
Co2: 28 mmol/L (ref 21–32)
Osmolality: 279 (ref 275–301)
Potassium: 4.1 mmol/L (ref 3.5–5.1)

## 2012-12-15 LAB — CBC
HGB: 15.5 g/dL (ref 13.0–18.0)
MCV: 87 fL (ref 80–100)
Platelet: 152 10*3/uL (ref 150–440)
RBC: 5.17 10*6/uL (ref 4.40–5.90)
RDW: 13.6 % (ref 11.5–14.5)

## 2012-12-16 ENCOUNTER — Encounter: Payer: Self-pay | Admitting: Internal Medicine

## 2012-12-19 ENCOUNTER — Ambulatory Visit: Payer: Self-pay | Admitting: Vascular Surgery

## 2013-01-26 ENCOUNTER — Encounter: Payer: Self-pay | Admitting: Internal Medicine

## 2013-01-27 ENCOUNTER — Other Ambulatory Visit: Payer: Self-pay | Admitting: Internal Medicine

## 2013-02-02 ENCOUNTER — Other Ambulatory Visit: Payer: Self-pay | Admitting: Internal Medicine

## 2013-02-27 ENCOUNTER — Ambulatory Visit: Payer: Medicare Other | Admitting: Internal Medicine

## 2013-03-02 ENCOUNTER — Telehealth: Payer: Self-pay | Admitting: Internal Medicine

## 2013-03-02 ENCOUNTER — Ambulatory Visit (INDEPENDENT_AMBULATORY_CARE_PROVIDER_SITE_OTHER): Payer: Medicare Other | Admitting: Internal Medicine

## 2013-03-02 ENCOUNTER — Encounter: Payer: Self-pay | Admitting: Internal Medicine

## 2013-03-02 VITALS — BP 122/72 | HR 107 | Temp 98.5°F | Wt 253.0 lb

## 2013-03-02 DIAGNOSIS — E663 Overweight: Secondary | ICD-10-CM | POA: Insufficient documentation

## 2013-03-02 DIAGNOSIS — D1800 Hemangioma unspecified site: Secondary | ICD-10-CM

## 2013-03-02 DIAGNOSIS — I10 Essential (primary) hypertension: Secondary | ICD-10-CM

## 2013-03-02 DIAGNOSIS — E785 Hyperlipidemia, unspecified: Secondary | ICD-10-CM

## 2013-03-02 DIAGNOSIS — E119 Type 2 diabetes mellitus without complications: Secondary | ICD-10-CM

## 2013-03-02 DIAGNOSIS — E669 Obesity, unspecified: Secondary | ICD-10-CM

## 2013-03-02 MED ORDER — SIMVASTATIN 40 MG PO TABS: ORAL_TABLET | ORAL | Status: DC

## 2013-03-02 MED ORDER — ONETOUCH ULTRA SYSTEM W/DEVICE KIT: 1.0000 | PACK | Freq: Once | Status: DC

## 2013-03-02 NOTE — Progress Notes (Signed)
   Subjective:    Patient ID: Dakota Gonzalez, male    DOB: 14-Aug-1945, 68 y.o.   MRN: [id number]  HPI 67YO male with DM, HTN, obesity presents for follow up. Feeling well. No concerns today. Compliant with medications. Recently had resection of hemangioma right upper arm, and pathology was benign.   Review of Systems  Constitutional: Negative for fever, chills, activity change, appetite change, fatigue and unexpected weight change.  Eyes: Negative for visual disturbance.  Respiratory: Negative for cough and shortness of breath.   Cardiovascular: Negative for chest pain, palpitations and leg swelling.  Gastrointestinal: Negative for abdominal pain and abdominal distention.  Genitourinary: Negative for dysuria, urgency and difficulty urinating.  Musculoskeletal: Negative for arthralgias and gait problem.  Skin: Negative for color change and rash.  Hematological: Negative for adenopathy.  Psychiatric/Behavioral: Negative for sleep disturbance and dysphoric mood. The patient is not nervous/anxious.        Objective:    BP 122/72  Pulse 107  Temp(Src) 98.5 F (36.9 C)  Wt 253 lb (114.76 kg)  SpO2 97% Physical Exam  Constitutional: He is oriented to person, place, and time. He appears well-developed and well-nourished. No distress.  HENT:  Head: Normocephalic and atraumatic.  Right Ear: External ear normal.  Left Ear: External ear normal.  Nose: Nose normal.  Mouth/Throat: Oropharynx is clear and moist. No oropharyngeal exudate.  Eyes: Conjunctivae and EOM are normal. Pupils are equal, round, and reactive to light. Right eye exhibits no discharge. Left eye exhibits no discharge. No scleral icterus.  Neck: Normal range of motion. Neck supple. No tracheal deviation present. No thyromegaly present.  Cardiovascular: Normal rate, regular rhythm and normal heart sounds.  Exam reveals no gallop and no friction rub.   No murmur heard. Pulmonary/Chest: Effort normal and breath sounds  normal. No accessory muscle usage. Not tachypneic. No respiratory distress. He has no decreased breath sounds. He has no wheezes. He has no rhonchi. He has no rales. He exhibits no tenderness.  Musculoskeletal: Normal range of motion. He exhibits no edema.  Lymphadenopathy:    He has no cervical adenopathy.  Neurological: He is alert and oriented to person, place, and time. No cranial nerve deficit. Coordination normal.  Skin: Skin is warm and dry. No rash noted. He is not diaphoretic. No erythema. No pallor.     Psychiatric: He has a normal mood and affect. His behavior is normal. Judgment and thought content normal.          Assessment & Plan:   Problem List Items Addressed This Visit   Diabetes mellitus type 2, controlled - Primary      Lab Results  Component Value Date   HGBA1C 6.6* 11/14/2012   Will check A1c with labs today. Continue Glimepiride. Encouraged healthy diet and regular exercise. Plan follow up in 3 months and prn.    Relevant Medications      simvastatin (ZOCOR) tablet      Blood Glucose Monitoring Suppl (ONE TOUCH ULTRA SYSTEM KIT) W/DEVICE KIT   HTN (hypertension)      BP Readings from Last 3 Encounters:  03/02/13 122/72  11/14/12 138/88  08/06/12 148/90   BP well controlled on lisinopril. Will check renal function with labs today.    Hyperlipidemia LDL goal < 70     Will check lipids and LFTs with labs today.        Return in about 3 months (around 05/30/2013) for Recheck of Diabetes.

## 2013-03-02 NOTE — Assessment & Plan Note (Signed)
Wt Readings from Last 3 Encounters:  03/02/13 253 lb (114.76 kg)  11/14/12 251 lb (113.853 kg)  08/06/12 246 lb (111.585 kg)     Encouraged healthy diet and regular exercise with goal of weight loss.

## 2013-03-02 NOTE — Assessment & Plan Note (Signed)
Will check lipids and LFTs with labs today. 

## 2013-03-02 NOTE — Assessment & Plan Note (Signed)
S/p resection of benign hemangioma right upper arm.

## 2013-03-02 NOTE — Assessment & Plan Note (Signed)
Lab Results  Component Value Date   HGBA1C 6.6* 11/14/2012   Will check A1c with labs today. Continue Glimepiride. Encouraged healthy diet and regular exercise. Plan follow up in 3 months and prn.

## 2013-03-02 NOTE — Assessment & Plan Note (Signed)
BP Readings from Last 3 Encounters:  03/02/13 122/72  11/14/12 138/88  08/06/12 148/90   BP well controlled on lisinopril. Will check renal function with labs today.

## 2013-03-02 NOTE — Progress Notes (Signed)
Pre-visit discussion using our clinic review tool. No additional management support is needed unless otherwise documented below in the visit note.  

## 2013-03-02 NOTE — Telephone Encounter (Signed)
Lab follow up

## 2013-03-04 ENCOUNTER — Telehealth: Payer: Self-pay | Admitting: Internal Medicine

## 2013-03-04 NOTE — Telephone Encounter (Signed)
Relevant patient education assigned to patient using Emmi. ° °

## 2013-03-05 ENCOUNTER — Other Ambulatory Visit (INDEPENDENT_AMBULATORY_CARE_PROVIDER_SITE_OTHER): Payer: Medicare Other

## 2013-03-05 DIAGNOSIS — E785 Hyperlipidemia, unspecified: Secondary | ICD-10-CM

## 2013-03-05 DIAGNOSIS — E119 Type 2 diabetes mellitus without complications: Secondary | ICD-10-CM

## 2013-03-05 DIAGNOSIS — I10 Essential (primary) hypertension: Secondary | ICD-10-CM

## 2013-03-05 LAB — HEMOGLOBIN A1C: HEMOGLOBIN A1C: 6.8 % — AB (ref 4.6–6.5)

## 2013-03-05 LAB — COMPREHENSIVE METABOLIC PANEL
ALT: 35 U/L (ref 0–53)
AST: 30 U/L (ref 0–37)
Albumin: 4.6 g/dL (ref 3.5–5.2)
Alkaline Phosphatase: 45 U/L (ref 39–117)
BUN: 12 mg/dL (ref 6–23)
CO2: 29 mEq/L (ref 19–32)
CREATININE: 1 mg/dL (ref 0.4–1.5)
Calcium: 10.7 mg/dL — ABNORMAL HIGH (ref 8.4–10.5)
Chloride: 100 mEq/L (ref 96–112)
GFR: 80.22 mL/min (ref >60.00)
GLUCOSE: 132 mg/dL — AB (ref 70–99)
Potassium: 4.3 mEq/L (ref 3.5–5.1)
Sodium: 135 mEq/L (ref 135–145)
Total Bilirubin: 1.2 mg/dL (ref 0.3–1.2)
Total Protein: 7.6 g/dL (ref 6.0–8.3)

## 2013-03-05 LAB — LIPID PANEL
CHOLESTEROL: 183 mg/dL (ref 0–200)
HDL: 44.4 mg/dL (ref >39.00)
TRIGLYCERIDES: 209 mg/dL — AB (ref 0.0–149.0)
Total CHOL/HDL Ratio: 4
VLDL: 41.8 mg/dL — AB (ref 0.0–40.0)

## 2013-03-05 LAB — MICROALBUMIN / CREATININE URINE RATIO
Creatinine,U: 99 mg/dL
MICROALB/CREAT RATIO: 0.4 mg/g (ref 0.0–30.0)
Microalb, Ur: 0.4 mg/dL (ref 0.0–1.9)

## 2013-03-06 ENCOUNTER — Telehealth: Payer: Self-pay

## 2013-03-06 LAB — LDL CHOLESTEROL, DIRECT: Direct LDL: 115.7 mg/dL

## 2013-03-06 NOTE — Telephone Encounter (Signed)
Relevant patient education assigned to patient using Emmi. ° °

## 2013-03-16 ENCOUNTER — Other Ambulatory Visit: Payer: Self-pay | Admitting: Internal Medicine

## 2013-03-16 ENCOUNTER — Other Ambulatory Visit (INDEPENDENT_AMBULATORY_CARE_PROVIDER_SITE_OTHER): Payer: Medicare Other

## 2013-03-17 LAB — PTH, INTACT AND CALCIUM
CALCIUM: 9.9 mg/dL (ref 8.4–10.5)
PTH: 54.5 pg/mL (ref 14.0–72.0)

## 2013-05-23 ENCOUNTER — Encounter: Payer: Self-pay | Admitting: Internal Medicine

## 2013-06-12 ENCOUNTER — Encounter: Payer: Self-pay | Admitting: Internal Medicine

## 2013-06-12 ENCOUNTER — Ambulatory Visit (INDEPENDENT_AMBULATORY_CARE_PROVIDER_SITE_OTHER): Payer: Medicare Other | Admitting: Internal Medicine

## 2013-06-12 VITALS — BP 136/88 | HR 80 | Temp 98.0°F | Ht 69.0 in | Wt 255.5 lb

## 2013-06-12 DIAGNOSIS — E785 Hyperlipidemia, unspecified: Secondary | ICD-10-CM

## 2013-06-12 DIAGNOSIS — I10 Essential (primary) hypertension: Secondary | ICD-10-CM

## 2013-06-12 DIAGNOSIS — Z23 Encounter for immunization: Secondary | ICD-10-CM

## 2013-06-12 DIAGNOSIS — G2581 Restless legs syndrome: Secondary | ICD-10-CM

## 2013-06-12 DIAGNOSIS — E119 Type 2 diabetes mellitus without complications: Secondary | ICD-10-CM

## 2013-06-12 LAB — COMPREHENSIVE METABOLIC PANEL
ALBUMIN: 4.6 g/dL (ref 3.5–5.2)
ALT: 41 U/L (ref 0–53)
AST: 37 U/L (ref 0–37)
Alkaline Phosphatase: 41 U/L (ref 39–117)
BUN: 15 mg/dL (ref 6–23)
CO2: 27 mEq/L (ref 19–32)
Calcium: 9.9 mg/dL (ref 8.4–10.5)
Chloride: 105 mEq/L (ref 96–112)
Creatinine, Ser: 1.1 mg/dL (ref 0.4–1.5)
GFR: 74.08 mL/min (ref >60.00)
Glucose, Bld: 140 mg/dL — ABNORMAL HIGH (ref 70–99)
POTASSIUM: 4.1 meq/L (ref 3.5–5.1)
SODIUM: 140 meq/L (ref 135–145)
Total Bilirubin: 1.3 mg/dL — ABNORMAL HIGH (ref 0.2–1.2)
Total Protein: 7.7 g/dL (ref 6.0–8.3)

## 2013-06-12 LAB — HEMOGLOBIN A1C: Hgb A1c MFr Bld: 7.2 % — ABNORMAL HIGH (ref 4.6–6.5)

## 2013-06-12 LAB — LIPID PANEL
Cholesterol: 171 mg/dL (ref 0–200)
HDL: 46 mg/dL (ref >39.00)
LDL CALC: 97 mg/dL (ref 0–99)
Total CHOL/HDL Ratio: 4
Triglycerides: 142 mg/dL (ref 0.0–149.0)
VLDL: 28.4 mg/dL (ref 0.0–40.0)

## 2013-06-12 LAB — MICROALBUMIN / CREATININE URINE RATIO
CREATININE, U: 217 mg/dL
Microalb Creat Ratio: 0.5 mg/g (ref 0.0–30.0)
Microalb, Ur: 1.1 mg/dL (ref 0.0–1.9)

## 2013-06-12 MED ORDER — GLIMEPIRIDE 4 MG PO TABS: ORAL_TABLET | ORAL | Status: DC

## 2013-06-12 MED ORDER — ROPINIROLE HCL 0.5 MG PO TABS: 0.5000 mg | ORAL_TABLET | Freq: Every day | ORAL | Status: DC

## 2013-06-12 NOTE — Assessment & Plan Note (Signed)
Will check lipids and LFTs  with labs today. Continue Simvastatin. 

## 2013-06-12 NOTE — Assessment & Plan Note (Signed)
BP Readings from Last 3 Encounters:  06/12/13 136/88  03/02/13 122/72  11/14/12 138/88   BP well controlled on current medication. Will continue.

## 2013-06-12 NOTE — Progress Notes (Signed)
Subjective:    Patient ID: Dakota Gonzalez, male    DOB: 09-08-1945, 68 y.o.   MRN: [id number]  HPI 68YO male presents for follow up.  DM - BG well controlled. Only one sugar above 200. Most in 120-130 range.  Restless legs - Taking Tylenol with some improvement. No improvement with neurontin. Wakes with leg pain. Would like to consider alternative medication.  Review of Systems  Constitutional: Negative for fever, chills, activity change, appetite change, fatigue and unexpected weight change.  Eyes: Negative for visual disturbance.  Respiratory: Negative for cough and shortness of breath.   Cardiovascular: Negative for chest pain, palpitations and leg swelling.  Gastrointestinal: Negative for abdominal pain and abdominal distention.  Genitourinary: Negative for dysuria, urgency and difficulty urinating.  Musculoskeletal: Positive for myalgias. Negative for arthralgias and gait problem.  Skin: Negative for color change and rash.  Hematological: Negative for adenopathy.  Psychiatric/Behavioral: Positive for sleep disturbance. Negative for dysphoric mood. The patient is not nervous/anxious.        Objective:    BP 136/88  Pulse 80  Temp(Src) 98 F (36.7 C) (Oral)  Ht 5\' 9"  (1.753 m)  Wt 255 lb 8 oz (115.894 kg)  BMI 37.71 kg/m2  SpO2 95% Physical Exam  Constitutional: He is oriented to person, place, and time. He appears well-developed and well-nourished. No distress.  HENT:  Head: Normocephalic and atraumatic.  Right Ear: External ear normal.  Left Ear: External ear normal.  Nose: Nose normal.  Mouth/Throat: Oropharynx is clear and moist. No oropharyngeal exudate.  Eyes: Conjunctivae and EOM are normal. Pupils are equal, round, and reactive to light. Right eye exhibits no discharge. Left eye exhibits no discharge. No scleral icterus.  Neck: Normal range of motion. Neck supple. No tracheal deviation present. No thyromegaly present.  Cardiovascular: Normal rate, regular  rhythm and normal heart sounds.  Exam reveals no gallop and no friction rub.   No murmur heard. Pulmonary/Chest: Effort normal and breath sounds normal. No accessory muscle usage. Not tachypneic. No respiratory distress. He has no decreased breath sounds. He has no wheezes. He has no rhonchi. He has no rales. He exhibits no tenderness.  Musculoskeletal: Normal range of motion. He exhibits no edema.  Lymphadenopathy:    He has no cervical adenopathy.  Neurological: He is alert and oriented to person, place, and time. No cranial nerve deficit. Coordination normal.  Skin: Skin is warm and dry. No rash noted. He is not diaphoretic. No erythema. No pallor.  Psychiatric: He has a normal mood and affect. His behavior is normal. Judgment and thought content normal.          Assessment & Plan:   Problem List Items Addressed This Visit     Unprioritized   Diabetes mellitus type 2, controlled      Lab Results  Component Value Date   HGBA1C 6.8* 03/05/2013   BG have been well controlled. Will repeat A1c with labs today. Continue current medications. Recommended Mediterranean style diet and exercise.    Relevant Medications      glimepiride (AMARYL) tablet   Other Relevant Orders      Comprehensive metabolic panel      Hemoglobin A1c      Lipid panel      Microalbumin / creatinine urine ratio   HTN (hypertension)      BP Readings from Last 3 Encounters:  06/12/13 136/88  03/02/13 122/72  11/14/12 138/88   BP well controlled on current medication. Will continue.  Hyperlipidemia LDL goal < 70     Will check lipids and LFTs with labs today. Continue Simvastatin.    Restless legs - Primary     Will try adding Requip at night, starting at 0.5mg  dose. Pt will email or call if symptoms are not controlled at this dose, and we will titrate dose as needed.    Relevant Medications      rOPINIRole (REQUIP) tablet    Other Visit Diagnoses   Need for prophylactic vaccination against  Streptococcus pneumoniae (pneumococcus)        Relevant Orders       Pneumococcal conjugate vaccine 13-valent (Completed)        Return in about 6 months (around 12/13/2013) for Wellness Visit.

## 2013-06-12 NOTE — Assessment & Plan Note (Signed)
Lab Results  Component Value Date   HGBA1C 6.8* 03/05/2013   BG have been well controlled. Will repeat A1c with labs today. Continue current medications. Recommended Mediterranean style diet and exercise.

## 2013-06-12 NOTE — Assessment & Plan Note (Signed)
Will try adding Requip at night, starting at 0.5mg  dose. Pt will email or call if symptoms are not controlled at this dose, and we will titrate dose as needed.

## 2013-06-12 NOTE — Progress Notes (Signed)
Pre visit review using our clinic review tool, if applicable. No additional management support is needed unless otherwise documented below in the visit note. 

## 2013-07-23 ENCOUNTER — Other Ambulatory Visit: Payer: Self-pay | Admitting: Internal Medicine

## 2013-08-28 ENCOUNTER — Encounter: Payer: Self-pay | Admitting: Internal Medicine

## 2013-09-09 ENCOUNTER — Other Ambulatory Visit: Payer: Self-pay | Admitting: *Deleted

## 2013-09-09 ENCOUNTER — Telehealth: Payer: Self-pay | Admitting: *Deleted

## 2013-09-09 DIAGNOSIS — E119 Type 2 diabetes mellitus without complications: Secondary | ICD-10-CM

## 2013-09-09 NOTE — Telephone Encounter (Signed)
Pt is coming in tomorrow what labs and dx?  

## 2013-09-09 NOTE — Telephone Encounter (Signed)
Cmp, lipids, A1c urine microalbumin 250.00

## 2013-09-10 ENCOUNTER — Other Ambulatory Visit (INDEPENDENT_AMBULATORY_CARE_PROVIDER_SITE_OTHER): Payer: Medicare Other

## 2013-09-10 DIAGNOSIS — E119 Type 2 diabetes mellitus without complications: Secondary | ICD-10-CM

## 2013-09-10 LAB — COMPREHENSIVE METABOLIC PANEL
ALT: 36 U/L (ref 0–53)
AST: 37 U/L (ref 0–37)
Albumin: 4.3 g/dL (ref 3.5–5.2)
Alkaline Phosphatase: 45 U/L (ref 39–117)
BUN: 12 mg/dL (ref 6–23)
CO2: 26 mEq/L (ref 19–32)
CREATININE: 1 mg/dL (ref 0.4–1.5)
Calcium: 9.8 mg/dL (ref 8.4–10.5)
Chloride: 102 mEq/L (ref 96–112)
GFR: 84 mL/min (ref >60.00)
Glucose, Bld: 147 mg/dL — ABNORMAL HIGH (ref 70–99)
POTASSIUM: 4.3 meq/L (ref 3.5–5.1)
Sodium: 136 mEq/L (ref 135–145)
Total Bilirubin: 1.8 mg/dL — ABNORMAL HIGH (ref 0.2–1.2)
Total Protein: 7.3 g/dL (ref 6.0–8.3)

## 2013-09-10 LAB — LIPID PANEL
CHOL/HDL RATIO: 4
Cholesterol: 171 mg/dL (ref 0–200)
HDL: 38.5 mg/dL — AB (ref >39.00)
LDL CALC: 98 mg/dL (ref 0–99)
NonHDL: 132.5
TRIGLYCERIDES: 173 mg/dL — AB (ref 0.0–149.0)
VLDL: 34.6 mg/dL (ref 0.0–40.0)

## 2013-09-10 LAB — MICROALBUMIN / CREATININE URINE RATIO
Creatinine,U: 165.9 mg/dL
Microalb Creat Ratio: 1.2 mg/g (ref 0.0–30.0)
Microalb, Ur: 2 mg/dL — ABNORMAL HIGH (ref 0.0–1.9)

## 2013-09-10 LAB — HEMOGLOBIN A1C: Hgb A1c MFr Bld: 7.4 % — ABNORMAL HIGH (ref 4.6–6.5)

## 2013-09-12 ENCOUNTER — Encounter: Payer: Self-pay | Admitting: Internal Medicine

## 2013-09-17 ENCOUNTER — Telehealth: Payer: Self-pay | Admitting: *Deleted

## 2013-09-17 ENCOUNTER — Other Ambulatory Visit (INDEPENDENT_AMBULATORY_CARE_PROVIDER_SITE_OTHER): Payer: Medicare Other

## 2013-09-17 DIAGNOSIS — R972 Elevated prostate specific antigen [PSA]: Secondary | ICD-10-CM

## 2013-09-17 DIAGNOSIS — Z125 Encounter for screening for malignant neoplasm of prostate: Secondary | ICD-10-CM

## 2013-09-17 LAB — PSA: PSA: 1.31 ng/mL (ref 0.10–4.00)

## 2013-09-17 NOTE — Telephone Encounter (Signed)
Dx code for PSA?

## 2013-09-17 NOTE — Telephone Encounter (Signed)
Screening prostate cancer

## 2013-10-02 ENCOUNTER — Other Ambulatory Visit: Payer: Self-pay | Admitting: Internal Medicine

## 2013-10-10 ENCOUNTER — Ambulatory Visit (INDEPENDENT_AMBULATORY_CARE_PROVIDER_SITE_OTHER): Payer: Medicare Other

## 2013-10-10 DIAGNOSIS — Z23 Encounter for immunization: Secondary | ICD-10-CM

## 2013-10-18 ENCOUNTER — Other Ambulatory Visit: Payer: Self-pay | Admitting: Internal Medicine

## 2013-10-30 ENCOUNTER — Other Ambulatory Visit: Payer: Self-pay | Admitting: Internal Medicine

## 2013-11-27 ENCOUNTER — Other Ambulatory Visit: Payer: Self-pay | Admitting: Internal Medicine

## 2013-12-07 ENCOUNTER — Encounter: Payer: Self-pay | Admitting: Internal Medicine

## 2013-12-14 ENCOUNTER — Ambulatory Visit (INDEPENDENT_AMBULATORY_CARE_PROVIDER_SITE_OTHER): Payer: Medicare Other | Admitting: Internal Medicine

## 2013-12-14 ENCOUNTER — Encounter: Payer: Self-pay | Admitting: Internal Medicine

## 2013-12-14 VITALS — BP 110/62 | HR 85 | Temp 98.2°F | Ht 69.25 in | Wt 258.2 lb

## 2013-12-14 DIAGNOSIS — Z0000 Encounter for general adult medical examination without abnormal findings: Secondary | ICD-10-CM

## 2013-12-14 DIAGNOSIS — I10 Essential (primary) hypertension: Secondary | ICD-10-CM

## 2013-12-14 DIAGNOSIS — E669 Obesity, unspecified: Secondary | ICD-10-CM

## 2013-12-14 DIAGNOSIS — M25362 Other instability, left knee: Secondary | ICD-10-CM

## 2013-12-14 DIAGNOSIS — E119 Type 2 diabetes mellitus without complications: Secondary | ICD-10-CM

## 2013-12-14 LAB — COMPREHENSIVE METABOLIC PANEL
ALBUMIN: 4.5 g/dL (ref 3.5–5.2)
ALK PHOS: 46 U/L (ref 39–117)
ALT: 37 U/L (ref 0–53)
AST: 39 U/L — ABNORMAL HIGH (ref 0–37)
BUN: 13 mg/dL (ref 6–23)
CO2: 23 meq/L (ref 19–32)
Calcium: 9.3 mg/dL (ref 8.4–10.5)
Chloride: 101 mEq/L (ref 96–112)
Creatinine, Ser: 1.1 mg/dL (ref 0.4–1.5)
GFR: 73.96 mL/min (ref >60.00)
GLUCOSE: 155 mg/dL — AB (ref 70–99)
Potassium: 4.2 mEq/L (ref 3.5–5.1)
SODIUM: 134 meq/L — AB (ref 135–145)
TOTAL PROTEIN: 7.5 g/dL (ref 6.0–8.3)
Total Bilirubin: 1.5 mg/dL — ABNORMAL HIGH (ref 0.2–1.2)

## 2013-12-14 LAB — CBC WITH DIFFERENTIAL/PLATELET
Basophils Absolute: 0 10*3/uL (ref 0.0–0.1)
Basophils Relative: 0.3 % (ref 0.0–3.0)
EOS ABS: 0.2 10*3/uL (ref 0.0–0.7)
EOS PCT: 2.1 % (ref 0.0–5.0)
HCT: 44.6 % (ref 39.0–52.0)
Hemoglobin: 14.7 g/dL (ref 13.0–17.0)
Lymphocytes Relative: 37.1 % (ref 12.0–46.0)
Lymphs Abs: 2.9 10*3/uL (ref 0.7–4.0)
MCHC: 33 g/dL (ref 30.0–36.0)
MCV: 88.1 fl (ref 78.0–100.0)
MONO ABS: 0.8 10*3/uL (ref 0.1–1.0)
Monocytes Relative: 10 % (ref 3.0–12.0)
NEUTROS PCT: 50.5 % (ref 43.0–77.0)
Neutro Abs: 3.9 10*3/uL (ref 1.4–7.7)
Platelets: 167 10*3/uL (ref 150.0–400.0)
RBC: 5.06 Mil/uL (ref 4.22–5.81)
RDW: 13.5 % (ref 11.5–15.5)
WBC: 7.8 10*3/uL (ref 4.0–10.5)

## 2013-12-14 LAB — LIPID PANEL
Cholesterol: 166 mg/dL (ref 0–200)
HDL: 35.9 mg/dL — ABNORMAL LOW (ref >39.00)
LDL Cholesterol: 91 mg/dL (ref 0–99)
NONHDL: 130.1
Total CHOL/HDL Ratio: 5
Triglycerides: 196 mg/dL — ABNORMAL HIGH (ref 0.0–149.0)
VLDL: 39.2 mg/dL (ref 0.0–40.0)

## 2013-12-14 LAB — MICROALBUMIN / CREATININE URINE RATIO
Creatinine,U: 238.2 mg/dL
MICROALB UR: 1.4 mg/dL (ref 0.0–1.9)
Microalb Creat Ratio: 0.6 mg/g (ref 0.0–30.0)

## 2013-12-14 LAB — HEMOGLOBIN A1C: HEMOGLOBIN A1C: 7.9 % — AB (ref 4.6–6.5)

## 2013-12-14 LAB — HM DIABETES FOOT EXAM: HM DIABETIC FOOT EXAM: NORMAL

## 2013-12-14 NOTE — Patient Instructions (Signed)

## 2013-12-14 NOTE — Assessment & Plan Note (Signed)
Wt Readings from Last 3 Encounters:  12/14/13 258 lb 4 oz (117.141 kg)  06/12/13 255 lb 8 oz (115.894 kg)  03/02/13 253 lb (114.76 kg)   Encouraged healthy diet and exercise.

## 2013-12-14 NOTE — Assessment & Plan Note (Signed)
General medical exam normal today except as noted. Immunizations are up to date.Colonoscopy is UTD. Again recommended that patient establish care with a dentist. Will check CMP, A1c, lipid profile with labs today. PSA is now followed by his urologist.

## 2013-12-14 NOTE — Assessment & Plan Note (Signed)
We discussed potential causes of knee instability. Recommended referral to orthopedics for imaging and further evaluation. He prefers to hold off for now. Will follow. Encouraged him to wear a left knee brace.

## 2013-12-14 NOTE — Progress Notes (Signed)
Pre visit review using our clinic review tool, if applicable. No additional management support is needed unless otherwise documented below in the visit note. 

## 2013-12-14 NOTE — Progress Notes (Signed)
Subjective:    Patient ID: Dakota Gonzalez, male    DOB: 11/09/1945, 68 y.o.   MRN: [id number]  HPI The patient is here for annual Medicare wellness examination and management of other chronic and acute problems.   The risk factors are reflected in the social history.  The roster of all physicians providing medical care to patient - is listed in the Snapshot section of the chart.  Activities of daily living:  The patient is 100% independent in all ADLs: dressing, toileting, feeding as well as independent mobility. Lives with wife. One dog.  Home safety : The patient has smoke detectors in the home. They wear seatbelts.  There are no firearms at home. There is no violence in the home.   There is no risks for hepatitis, STDs or HIV. There is no history of blood transfusion. They have no travel history to infectious disease endemic areas of the world.  The patient has not seen their dentist in the last six month.  Encouraged pt to establish care with dentist. They have seen their eye doctor in the last year. Changing to eye doctor because of insurance this year. No issues with hearing They have deferred audiologic testing in the last year.    They do not  have excessive sun exposure. Discussed the need for sun protection: hats, long sleeves and use of sunscreen if there is significant sun exposure. No dermatologist  Diet: the importance of a healthy diet is discussed. They do have a healthy diet.  The benefits of regular aerobic exercise were discussed. He uses cardio machines at home (elliptical) on regular basis, and walks.  Depression screen: there are no signs or vegative symptoms of depression- irritability, change in appetite, anhedonia, sadness/tearfullness.  Cognitive assessment: the patient manages all their financial and personal affairs and is actively engaged. They could relate day,date,year and events.  The following portions of the patient's history were reviewed and  updated as appropriate: allergies, current medications, past family history, past medical history,  past surgical history, past social history  and problem list.  Visual acuity was not assessed per patient preference since he has regular follow up with ophthalmologist. Hearing and body mass index were assessed and reviewed.   During the course of the visit the patient was educated and counseled about appropriate screening and preventive services including : fall prevention , diabetes screening, nutrition counseling, colorectal cancer screening, and recommended immunizations.    In regards to diabetes, patient reports that blood sugars have been well controlled. He has been taking his glyburide 4 mg daily.  ACUTE ISSUE:  Left knee - will "pop out" on him periodically over last 3 months. Cannot put pressure on knee during these times. Becomes painful to walk or stand. Not taking anything for pain. No known injury or trauma to knee.   Review of Systems  Constitutional: Negative for fever, chills, activity change, appetite change, fatigue and unexpected weight change.  Eyes: Negative for visual disturbance.  Respiratory: Negative for cough and shortness of breath.   Cardiovascular: Negative for chest pain, palpitations and leg swelling.  Gastrointestinal: Negative for nausea, vomiting, abdominal pain, diarrhea, constipation and abdominal distention.  Genitourinary: Negative for dysuria, urgency, frequency, decreased urine volume and difficulty urinating.  Musculoskeletal: Positive for myalgias and arthralgias. Negative for gait problem.  Skin: Negative for color change and rash.  Neurological: Positive for weakness (when knee "pops out"). Negative for numbness.  Hematological: Negative for adenopathy.  Psychiatric/Behavioral: Negative for sleep disturbance  and dysphoric mood. The patient is not nervous/anxious.        Objective:    BP 110/62 mmHg  Pulse 85  Temp(Src) 98.2 F (36.8 C)  (Oral)  Ht 5' 9.25" (1.759 m)  Wt 258 lb 4 oz (117.141 kg)  BMI 37.86 kg/m2  SpO2 97% Physical Exam  Constitutional: He is oriented to person, place, and time. He appears well-developed and well-nourished. No distress.  HENT:  Head: Normocephalic and atraumatic.  Right Ear: External ear normal.  Left Ear: External ear normal.  Nose: Nose normal.  Mouth/Throat: Oropharynx is clear and moist. No oropharyngeal exudate.  Eyes: Conjunctivae and EOM are normal. Pupils are equal, round, and reactive to light. Right eye exhibits no discharge. Left eye exhibits no discharge. No scleral icterus.  Neck: Normal range of motion. Neck supple. No tracheal deviation present. No thyromegaly present.  Cardiovascular: Normal rate, regular rhythm and normal heart sounds.  Exam reveals no gallop and no friction rub.   No murmur heard. Pulmonary/Chest: Effort normal and breath sounds normal. No accessory muscle usage. No tachypnea. No respiratory distress. He has no decreased breath sounds. He has no wheezes. He has no rhonchi. He has no rales. He exhibits no tenderness.  Abdominal: Soft. Bowel sounds are normal. He exhibits no distension and no mass. There is no tenderness. There is no rebound and no guarding.  Musculoskeletal: Normal range of motion. He exhibits no edema.  Lymphadenopathy:    He has no cervical adenopathy.  Neurological: He is alert and oriented to person, place, and time. No cranial nerve deficit. Coordination normal.  Skin: Skin is warm and dry. No rash noted. He is not diaphoretic. No erythema. No pallor.  Psychiatric: He has a normal mood and affect. His behavior is normal. Judgment and thought content normal.          Assessment & Plan:   Problem List Items Addressed This Visit      Unprioritized   Instability of left knee joint    We discussed potential causes of knee instability. Recommended referral to orthopedics for imaging and further evaluation. He prefers to hold off  for now. Will follow. Encouraged him to wear a left knee brace.    Medicare annual wellness visit, subsequent - Primary    General medical exam normal today except as noted. Immunizations are up to date.Colonoscopy is UTD. Again recommended that patient establish care with a dentist. Will check CMP, A1c, lipid profile with labs today. PSA is now followed by his urologist.      Relevant Orders      Comprehensive metabolic panel      Hemoglobin A1c      Lipid panel      Microalbumin / creatinine urine ratio      CBC with Differential   Obesity (BMI 30-39.9)    Wt Readings from Last 3 Encounters:  12/14/13 258 lb 4 oz (117.141 kg)  06/12/13 255 lb 8 oz (115.894 kg)  03/02/13 253 lb (114.76 kg)   Encouraged healthy diet and exercise.        Return in about 6 months (around 06/14/2014) for Recheck of Diabetes.

## 2013-12-28 ENCOUNTER — Other Ambulatory Visit: Payer: Self-pay | Admitting: *Deleted

## 2013-12-28 ENCOUNTER — Other Ambulatory Visit: Payer: Self-pay | Admitting: Internal Medicine

## 2013-12-28 MED ORDER — ROPINIROLE HCL 0.5 MG PO TABS: 0.5000 mg | ORAL_TABLET | Freq: Every day | ORAL | Status: DC

## 2014-01-27 DIAGNOSIS — E119 Type 2 diabetes mellitus without complications: Secondary | ICD-10-CM | POA: Diagnosis not present

## 2014-02-01 ENCOUNTER — Encounter: Payer: Self-pay | Admitting: Internal Medicine

## 2014-02-01 ENCOUNTER — Other Ambulatory Visit: Payer: Self-pay | Admitting: *Deleted

## 2014-02-01 MED ORDER — ONETOUCH LANCETS MISC: Status: DC

## 2014-02-01 MED ORDER — GLUCOSE BLOOD VI STRP: ORAL_STRIP | Status: DC

## 2014-02-22 ENCOUNTER — Other Ambulatory Visit: Payer: Self-pay | Admitting: *Deleted

## 2014-02-22 MED ORDER — SIMVASTATIN 40 MG PO TABS: ORAL_TABLET | ORAL | Status: DC

## 2014-03-15 ENCOUNTER — Telehealth: Payer: Self-pay | Admitting: *Deleted

## 2014-03-15 ENCOUNTER — Other Ambulatory Visit (INDEPENDENT_AMBULATORY_CARE_PROVIDER_SITE_OTHER): Payer: Medicare Other

## 2014-03-15 DIAGNOSIS — E119 Type 2 diabetes mellitus without complications: Secondary | ICD-10-CM

## 2014-03-15 LAB — COMPREHENSIVE METABOLIC PANEL
ALT: 32 U/L (ref 0–53)
AST: 25 U/L (ref 0–37)
Albumin: 4.5 g/dL (ref 3.5–5.2)
Alkaline Phosphatase: 50 U/L (ref 39–117)
BUN: 13 mg/dL (ref 6–23)
CHLORIDE: 103 meq/L (ref 96–112)
CO2: 29 meq/L (ref 19–32)
CREATININE: 1 mg/dL (ref 0.40–1.50)
Calcium: 9.9 mg/dL (ref 8.4–10.5)
GFR: 79.05 mL/min (ref >60.00)
Glucose, Bld: 156 mg/dL — ABNORMAL HIGH (ref 70–99)
Potassium: 4.3 mEq/L (ref 3.5–5.1)
SODIUM: 137 meq/L (ref 135–145)
TOTAL PROTEIN: 7.1 g/dL (ref 6.0–8.3)
Total Bilirubin: 1.3 mg/dL — ABNORMAL HIGH (ref 0.2–1.2)

## 2014-03-15 LAB — LIPID PANEL
CHOL/HDL RATIO: 4
Cholesterol: 146 mg/dL (ref 0–200)
HDL: 38.6 mg/dL — ABNORMAL LOW (ref >39.00)
LDL Cholesterol: 77 mg/dL (ref 0–99)
NONHDL: 107.4
Triglycerides: 150 mg/dL — ABNORMAL HIGH (ref 0.0–149.0)
VLDL: 30 mg/dL (ref 0.0–40.0)

## 2014-03-15 LAB — HEMOGLOBIN A1C: Hgb A1c MFr Bld: 7.3 % — ABNORMAL HIGH (ref 4.6–6.5)

## 2014-03-15 NOTE — Telephone Encounter (Signed)
What labs and dx?  

## 2014-03-15 NOTE — Telephone Encounter (Signed)
CMP, A1c, lipids for diabetes.

## 2014-03-24 ENCOUNTER — Emergency Department: Payer: Self-pay | Admitting: Emergency Medicine

## 2014-03-24 ENCOUNTER — Telehealth: Payer: Self-pay | Admitting: Internal Medicine

## 2014-03-24 DIAGNOSIS — E119 Type 2 diabetes mellitus without complications: Secondary | ICD-10-CM | POA: Diagnosis not present

## 2014-03-24 DIAGNOSIS — M545 Low back pain: Secondary | ICD-10-CM | POA: Diagnosis not present

## 2014-03-24 DIAGNOSIS — W19XXXA Unspecified fall, initial encounter: Secondary | ICD-10-CM | POA: Diagnosis not present

## 2014-03-24 DIAGNOSIS — I10 Essential (primary) hypertension: Secondary | ICD-10-CM | POA: Diagnosis not present

## 2014-03-24 DIAGNOSIS — M549 Dorsalgia, unspecified: Secondary | ICD-10-CM | POA: Diagnosis not present

## 2014-03-24 DIAGNOSIS — G8929 Other chronic pain: Secondary | ICD-10-CM | POA: Diagnosis not present

## 2014-03-24 NOTE — Telephone Encounter (Signed)
[facility] Patient Name: Dakota Gonzalez DOB: Jun 06, 1945 Initial Comment Caller states the PT injured his back Monday about noon. He has been flat on his back since then. He is only getting up to eat and go to the bathroom. The caller has been treating the PT alternating between head and ice. Nurse Assessment Nurse: Martyn Ehrich RN, Felicia Date/Time (Eastern Time): 03/24/2014 4:16:53 PM Confirm and document reason for call. If symptomatic, describe symptoms. ---Pt was trying to do an outside repair on Mon. on a ladder and ladder shifted and he grabbed it and wrenched his back. Surgery in L-4 to 5 in 06' and bulging disks in neck and shoulder blade area. Dr. Gilford Rile has not had to tx him but previous MD's have. Now it is hard for him to move - walking with cane. Has the patient traveled out of the country within the last 30 days? ---NoDoes the patient require triage? ---Yes Related visit to physician within the last 2 weeks? ---No Does the PT have any chronic conditions? (i.e. diabetes, asthma, etc.) ---Yes List chronic conditions. ---DM 2 and see above Guidelines Guideline Title Affirmed Question Affirmed Notes Back Injury [1] Weakness (i.e., paralysis, loss of muscle strength) of the leg(s) or foot AND [2] sudden onset after back injury Final Disposition User Call EMS 911 [name], RN, [name]  PLEASE NOTE: All timestamps contained within this report are represented as [place] Standard Time. CONFIDENTIALTY NOTICE: This fax transmission is intended only for the addressee. It contains information that is legally privileged, confidential or otherwise protected from use or disclosure. If you are not the intended recipient, you are strictly prohibited from reviewing, disclosing, copying using or disseminating any of this information or taking any action in reliance on or regarding this information.  If you have received this fax in error, please notify us immediately by telephone so that we can arrange for its return to [place]. Phone: [phone], Toll-Free: [phone], Fax: [phone] Page: 1 of 1 Call Id: [id number]

## 2014-03-24 NOTE — Telephone Encounter (Signed)
FYI, sent to ED

## 2014-03-26 ENCOUNTER — Other Ambulatory Visit: Payer: Self-pay | Admitting: Internal Medicine

## 2014-04-16 ENCOUNTER — Other Ambulatory Visit: Payer: Self-pay | Admitting: Internal Medicine

## 2014-05-07 NOTE — Op Note (Signed)
PATIENT NAME:  Dakota Gonzalez, Dakota Gonzalez MR#:  [id number] DATE OF BIRTH:  07-Jul-1945  DATE OF PROCEDURE:  12/19/2012  PREOPERATIVE DIAGNOSES: Painful mass, right arm.   POSTOPERATIVE DIAGNOSIS: Painful mass, right arm.    POSTOPERATIVE DIAGNOSIS: Excision of mass, right arm.   SURGEON: Katha Cabal, M.D.   ANESTHESIA: General by LMA.   FLUIDS: Per anesthesia record.   ESTIMATED BLOOD LOSS: Minimal.   SPECIMEN: Mass measuring approximately 3 x 2.5 cm to pathology for permanent section.   INDICATIONS: Mr. Sarver is a 69 year old gentleman who presented to the office with a palpable mass which was tender. Physical examination as well as Doppler studies demonstrated what appears to be solid mass, nonvascular, and I have recommended excisional biopsy. The risks and benefits were reviewed. All questions answered. The patient agrees to proceed.   DESCRIPTION OF PROCEDURE: The patient is taken to the operating room and placed in the supine position. After adequate general anesthesia is induced, he is positioned supine, and his right arm is extended palm upward. Appropriate timeout is called after his arm is prepped and draped in sterile fashion.   A linear incision is created overlying the mass in a somewhat transverse fashion, and the dissection is carried through the skin. Immediately below the skin, a cluster of what appears to be semi-acute thrombus is encountered. It is not well encapsulated. It appears more like a cluster of grapes in a bunch, but it is clearly predominantly thrombus. Using [facility] retractors and working along the edge, this mass is excised. It is sitting above the fascia. It does not invade any structures, and there do not appear to be any large vessels coursing into this region. A small tail-like piece more medially is resected as a secondary specimen.   The specimen was then passed off the field. The wound is irrigated, inspected for hemostasis with Bovie cautery and then  subsequently closed in layers using interrupted 3-0 Vicryl followed by 4-0 Monocryl subcuticular and then Dermabond. The patient tolerated the procedure well, and there were no immediate complications. The mass remains somewhat ill defined at this time, and we await pathology.    ____________________________ Katha Cabal, MD ggs:gb D: 12/19/2012 17:40:15 ET T: 12/19/2012 21:26:09 ET JOB#: [id number]  cc: Katha Cabal, MD, <Dictator> Eduard Clos. Gilford Rile, MD Katha Cabal MD ELECTRONICALLY SIGNED 12/22/2012 17:25

## 2014-06-15 ENCOUNTER — Ambulatory Visit (INDEPENDENT_AMBULATORY_CARE_PROVIDER_SITE_OTHER): Payer: Medicare Other | Admitting: Internal Medicine

## 2014-06-15 ENCOUNTER — Encounter: Payer: Self-pay | Admitting: Internal Medicine

## 2014-06-15 VITALS — BP 127/76 | HR 81 | Temp 98.0°F | Ht 69.25 in | Wt 247.0 lb

## 2014-06-15 DIAGNOSIS — E669 Obesity, unspecified: Secondary | ICD-10-CM

## 2014-06-15 DIAGNOSIS — R972 Elevated prostate specific antigen [PSA]: Secondary | ICD-10-CM

## 2014-06-15 DIAGNOSIS — E119 Type 2 diabetes mellitus without complications: Secondary | ICD-10-CM | POA: Diagnosis not present

## 2014-06-15 DIAGNOSIS — E785 Hyperlipidemia, unspecified: Secondary | ICD-10-CM | POA: Diagnosis not present

## 2014-06-15 DIAGNOSIS — I10 Essential (primary) hypertension: Secondary | ICD-10-CM

## 2014-06-15 DIAGNOSIS — Z634 Disappearance and death of family member: Secondary | ICD-10-CM | POA: Insufficient documentation

## 2014-06-15 LAB — COMPREHENSIVE METABOLIC PANEL
ALT: 37 U/L (ref 0–53)
AST: 25 U/L (ref 0–37)
Albumin: 4.8 g/dL (ref 3.5–5.2)
Alkaline Phosphatase: 45 U/L (ref 39–117)
BUN: 13 mg/dL (ref 6–23)
CO2: 28 mEq/L (ref 19–32)
Calcium: 10 mg/dL (ref 8.4–10.5)
Chloride: 102 mEq/L (ref 96–112)
Creatinine, Ser: 0.99 mg/dL (ref 0.40–1.50)
GFR: 79.91 mL/min (ref >60.00)
Glucose, Bld: 128 mg/dL — ABNORMAL HIGH (ref 70–99)
Potassium: 4.5 mEq/L (ref 3.5–5.1)
Sodium: 138 mEq/L (ref 135–145)
Total Bilirubin: 1.2 mg/dL (ref 0.2–1.2)
Total Protein: 7.3 g/dL (ref 6.0–8.3)

## 2014-06-15 LAB — LIPID PANEL
Cholesterol: 141 mg/dL (ref 0–200)
HDL: 42.6 mg/dL (ref >39.00)
LDL Cholesterol: 62 mg/dL (ref 0–99)
NonHDL: 98.4
Total CHOL/HDL Ratio: 3
Triglycerides: 180 mg/dL — ABNORMAL HIGH (ref 0.0–149.0)
VLDL: 36 mg/dL (ref 0.0–40.0)

## 2014-06-15 LAB — MICROALBUMIN / CREATININE URINE RATIO
CREATININE, U: 192.4 mg/dL
Microalb Creat Ratio: 0.6 mg/g (ref 0.0–30.0)
Microalb, Ur: 1.1 mg/dL (ref 0.0–1.9)

## 2014-06-15 LAB — HEMOGLOBIN A1C: HEMOGLOBIN A1C: 6.9 % — AB (ref 4.6–6.5)

## 2014-06-15 LAB — PSA: PSA: 1.68 ng/mL (ref 0.10–4.00)

## 2014-06-15 NOTE — Assessment & Plan Note (Signed)
Will check A1c with labs. Continue Metformin. 

## 2014-06-15 NOTE — Assessment & Plan Note (Signed)
Will check PSA with labs.

## 2014-06-15 NOTE — Assessment & Plan Note (Signed)
Will check lipids and LFTs  with labs today. Continue Simvastatin. 

## 2014-06-15 NOTE — Patient Instructions (Signed)
Labs today

## 2014-06-15 NOTE — Assessment & Plan Note (Signed)
Offered support today given recent death of his wife. Will continue to monitor. He will contact our office if he would like to set up counseling.

## 2014-06-15 NOTE — Assessment & Plan Note (Signed)
Wt Readings from Last 3 Encounters:  06/15/14 247 lb (112.038 kg)  12/14/13 258 lb 4 oz (117.141 kg)  06/12/13 255 lb 8 oz (115.894 kg)   Body mass index is 36.21 kg/(m^2). Encouraged healthy diet and exercise.

## 2014-06-15 NOTE — Progress Notes (Signed)
Pre visit review using our clinic review tool, if applicable. No additional management support is needed unless otherwise documented below in the visit note. 

## 2014-06-15 NOTE — Progress Notes (Signed)
Subjective:    Patient ID: Dakota Gonzalez, male    DOB: Nov 22, 1945, 69 y.o.   MRN: [id number]  HPI  69YO male presents for follow up.  DM - BG running mostly near 120. Initially higher in 03/2014 after prednisone taper. Compliant with medication and following a healthy diet.  Recently lost weight after death of his wife unexpectedly. Notes some depressed mood after her death, but feels that he is coping well. Has strong support from his sister and his church.  Wt Readings from Last 3 Encounters:  06/15/14 247 lb (112.038 kg)  12/14/13 258 lb 4 oz (117.141 kg)  06/12/13 255 lb 8 oz (115.894 kg)     Past medical, surgical, family and social history per today's encounter.  Review of Systems  Constitutional: Negative for fever, chills, activity change, appetite change, fatigue and unexpected weight change.  Eyes: Negative for visual disturbance.  Respiratory: Negative for cough and shortness of breath.   Cardiovascular: Negative for chest pain, palpitations and leg swelling.  Gastrointestinal: Negative for nausea, vomiting, abdominal pain, diarrhea, constipation and abdominal distention.  Genitourinary: Negative for dysuria, urgency and difficulty urinating.  Musculoskeletal: Negative for arthralgias and gait problem.  Skin: Negative for color change and rash.  Hematological: Negative for adenopathy.  Psychiatric/Behavioral: Positive for sleep disturbance and dysphoric mood. The patient is not nervous/anxious.        Objective:    BP 127/76 mmHg  Pulse 81  Temp(Src) 98 F (36.7 C) (Oral)  Ht 5' 9.25" (1.759 m)  Wt 247 lb (112.038 kg)  BMI 36.21 kg/m2  SpO2 97% Physical Exam  Constitutional: He is oriented to person, place, and time. He appears well-developed and well-nourished. No distress.  HENT:  Head: Normocephalic and atraumatic.  Right Ear: External ear normal.  Left Ear: External ear normal.  Nose: Nose normal.  Mouth/Throat: Oropharynx is clear and moist.  No oropharyngeal exudate.  Eyes: Conjunctivae and EOM are normal. Pupils are equal, round, and reactive to light. Right eye exhibits no discharge. Left eye exhibits no discharge. No scleral icterus.  Neck: Normal range of motion. Neck supple. No tracheal deviation present. No thyromegaly present.  Cardiovascular: Normal rate, regular rhythm and normal heart sounds.  Exam reveals no gallop and no friction rub.   No murmur heard. Pulmonary/Chest: Effort normal and breath sounds normal. No accessory muscle usage. No tachypnea. No respiratory distress. He has no decreased breath sounds. He has no wheezes. He has no rhonchi. He has no rales. He exhibits no tenderness.  Musculoskeletal: Normal range of motion. He exhibits no edema.  Lymphadenopathy:    He has no cervical adenopathy.  Neurological: He is alert and oriented to person, place, and time. No cranial nerve deficit. Coordination normal.  Skin: Skin is warm and dry. No rash noted. He is not diaphoretic. No erythema. No pallor.  Psychiatric: His speech is normal and behavior is normal. Judgment and thought content normal. Cognition and memory are normal. He exhibits a depressed mood.          Assessment & Plan:   Problem List Items Addressed This Visit      Unprioritized   Bereavement    Offered support today given recent death of his wife. Will continue to monitor. He will contact our office if he would like to set up counseling.      Diabetes mellitus type 2, controlled - Primary    Will check A1c with labs. Continue Metformin.      Relevant  Orders   Comprehensive metabolic panel   Hemoglobin A1c   Microalbumin / creatinine urine ratio   Elevated PSA    Will check PSA with labs.      Relevant Orders   PSA   HTN (hypertension)    BP Readings from Last 3 Encounters:  06/15/14 127/76  12/14/13 110/62  06/12/13 136/88   BP well controlled. Continue Lisionpril. Renal function with labs.      Hyperlipidemia    Will  check lipids and LFTs with labs today. Continue Simvastatin.      Relevant Orders   Lipid panel   Obesity (BMI 30-39.9)    Wt Readings from Last 3 Encounters:  06/15/14 247 lb (112.038 kg)  12/14/13 258 lb 4 oz (117.141 kg)  06/12/13 255 lb 8 oz (115.894 kg)   Body mass index is 36.21 kg/(m^2). Encouraged healthy diet and exercise.          Return in about 6 months (around 12/15/2014) for Recheck of Diabetes.

## 2014-06-15 NOTE — Assessment & Plan Note (Signed)
BP Readings from Last 3 Encounters:  06/15/14 127/76  12/14/13 110/62  06/12/13 136/88   BP well controlled. Continue Lisionpril. Renal function with labs.

## 2014-06-26 ENCOUNTER — Other Ambulatory Visit: Payer: Self-pay | Admitting: Internal Medicine

## 2014-08-09 ENCOUNTER — Other Ambulatory Visit: Payer: Self-pay | Admitting: Internal Medicine

## 2014-08-19 ENCOUNTER — Encounter: Payer: Self-pay | Admitting: Internal Medicine

## 2014-08-25 DIAGNOSIS — Z1212 Encounter for screening for malignant neoplasm of rectum: Secondary | ICD-10-CM | POA: Diagnosis not present

## 2014-08-25 DIAGNOSIS — Z1211 Encounter for screening for malignant neoplasm of colon: Secondary | ICD-10-CM | POA: Diagnosis not present

## 2014-08-25 LAB — COLOGUARD

## 2014-09-02 ENCOUNTER — Telehealth: Payer: Self-pay | Admitting: Internal Medicine

## 2014-09-02 NOTE — Telephone Encounter (Signed)
Cologuard testing was negative.

## 2014-09-03 NOTE — Telephone Encounter (Signed)
Notified patient of these results.

## 2014-09-16 ENCOUNTER — Encounter: Payer: Self-pay | Admitting: Internal Medicine

## 2014-09-23 ENCOUNTER — Other Ambulatory Visit: Payer: Self-pay | Admitting: Internal Medicine

## 2014-10-21 ENCOUNTER — Other Ambulatory Visit: Payer: Self-pay | Admitting: Internal Medicine

## 2014-11-05 ENCOUNTER — Other Ambulatory Visit: Payer: Self-pay | Admitting: Internal Medicine

## 2014-11-07 DIAGNOSIS — I10 Essential (primary) hypertension: Secondary | ICD-10-CM | POA: Diagnosis not present

## 2014-11-07 DIAGNOSIS — N400 Enlarged prostate without lower urinary tract symptoms: Secondary | ICD-10-CM | POA: Diagnosis not present

## 2014-11-07 DIAGNOSIS — M545 Low back pain: Secondary | ICD-10-CM | POA: Diagnosis not present

## 2014-11-07 DIAGNOSIS — D72829 Elevated white blood cell count, unspecified: Secondary | ICD-10-CM | POA: Diagnosis not present

## 2014-11-07 DIAGNOSIS — E7800 Pure hypercholesterolemia, unspecified: Secondary | ICD-10-CM | POA: Diagnosis not present

## 2014-11-07 DIAGNOSIS — K5660 Unspecified intestinal obstruction: Secondary | ICD-10-CM | POA: Diagnosis not present

## 2014-11-07 DIAGNOSIS — N200 Calculus of kidney: Secondary | ICD-10-CM | POA: Diagnosis not present

## 2014-11-07 DIAGNOSIS — Z87891 Personal history of nicotine dependence: Secondary | ICD-10-CM | POA: Diagnosis not present

## 2014-11-07 DIAGNOSIS — E119 Type 2 diabetes mellitus without complications: Secondary | ICD-10-CM | POA: Diagnosis not present

## 2014-11-07 DIAGNOSIS — E860 Dehydration: Secondary | ICD-10-CM | POA: Diagnosis not present

## 2014-11-09 ENCOUNTER — Ambulatory Visit (INDEPENDENT_AMBULATORY_CARE_PROVIDER_SITE_OTHER): Payer: Medicare Other | Admitting: Internal Medicine

## 2014-11-09 ENCOUNTER — Encounter: Payer: Self-pay | Admitting: Internal Medicine

## 2014-11-09 VITALS — BP 155/72 | HR 85 | Temp 98.0°F | Ht 69.25 in | Wt 247.0 lb

## 2014-11-09 DIAGNOSIS — E1159 Type 2 diabetes mellitus with other circulatory complications: Secondary | ICD-10-CM | POA: Diagnosis not present

## 2014-11-09 DIAGNOSIS — K567 Ileus, unspecified: Secondary | ICD-10-CM | POA: Insufficient documentation

## 2014-11-09 DIAGNOSIS — I2510 Atherosclerotic heart disease of native coronary artery without angina pectoris: Secondary | ICD-10-CM

## 2014-11-09 DIAGNOSIS — Z23 Encounter for immunization: Secondary | ICD-10-CM | POA: Diagnosis not present

## 2014-11-09 LAB — COMPREHENSIVE METABOLIC PANEL
ALBUMIN: 4.6 g/dL (ref 3.5–5.2)
ALK PHOS: 47 U/L (ref 39–117)
ALT: 30 U/L (ref 0–53)
AST: 35 U/L (ref 0–37)
BILIRUBIN TOTAL: 1.4 mg/dL — AB (ref 0.2–1.2)
BUN: 14 mg/dL (ref 6–23)
CALCIUM: 10.1 mg/dL (ref 8.4–10.5)
CO2: 27 meq/L (ref 19–32)
CREATININE: 0.99 mg/dL (ref 0.40–1.50)
Chloride: 101 mEq/L (ref 96–112)
GFR: 79.81 mL/min (ref >60.00)
Glucose, Bld: 108 mg/dL — ABNORMAL HIGH (ref 70–99)
Potassium: 4.3 mEq/L (ref 3.5–5.1)
Sodium: 138 mEq/L (ref 135–145)
Total Protein: 7.4 g/dL (ref 6.0–8.3)

## 2014-11-09 LAB — CBC
HEMATOCRIT: 45.4 % (ref 39.0–52.0)
Hemoglobin: 15.1 g/dL (ref 13.0–17.0)
MCHC: 33.2 g/dL (ref 30.0–36.0)
MCV: 87.6 fl (ref 78.0–100.0)
Platelets: 167 10*3/uL (ref 150.0–400.0)
RBC: 5.18 Mil/uL (ref 4.22–5.81)
RDW: 14.2 % (ref 11.5–15.5)
WBC: 8.4 10*3/uL (ref 4.0–10.5)

## 2014-11-09 LAB — HEMOGLOBIN A1C: Hgb A1c MFr Bld: 7.2 % — ABNORMAL HIGH (ref 4.6–6.5)

## 2014-11-09 NOTE — Progress Notes (Signed)
Pre visit review using our clinic review tool, if applicable. No additional management support is needed unless otherwise documented below in the visit note. 

## 2014-11-09 NOTE — Assessment & Plan Note (Addendum)
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 EKG today shows NSR with no acute process.. Risks for CAD including DM, obesity, HL, HTN. Will continue aspirin, statin. Will set up cardiology evaluation for possible stress test. Follow up 4 weeks.

## 2014-11-09 NOTE — Assessment & Plan Note (Signed)
Pt does not check BG. Will check A1c with labs.

## 2014-11-09 NOTE — Assessment & Plan Note (Signed)
Recent ileus, seen at [facility] ED. Suspect viral infection causing symptoms. Symptoms have improved and he is tolerating a full diet, having regular BMs. Will monitor. Avoid pain medication. Call if recurrent symptoms. Recheck CMP and CBC today.

## 2014-11-09 NOTE — Patient Instructions (Signed)
Labs today.  We will set up evaluation for stress test with cardiology.  Follow up in 4 weeks.

## 2014-11-09 NOTE — Progress Notes (Signed)
Subjective:    Patient ID: Dakota Gonzalez, male    DOB: 05-06-1945, 69 y.o.   MRN: [id number]  HPI  69YO male presents for follow up.  See at the [facility] ED for abdominal pain 10/23. CT abdomen showed ileus.  Sunday, developed pain in abdomen. Left church and went to ED. CT showed ileus. Had severe nausea, but no vomiting. No diarrhea or constipation noted. Had BM Sunday. Had BM today. After discharge, had some recurrent abdominal pain with nausea at home. Had episode of syncope at home and fell, hitting tailbone. Did not go back to hospital. Thinks this was reaction to pain medication. As of Monday, pain has been gone  Not taking any more pain medication. Tolerating regular diet yesterday and today without NV or abdominal pain.  CAD - 3vessel disease noted on CT as incidental finding. No recent chest pain, dyspnea. Had a stress test in the distant past which was reportedly normal.  DM - Has not checked recently. Compliant with medication.  Wt Readings from Last 3 Encounters:  11/09/14 247 lb (112.038 kg)  06/15/14 247 lb (112.038 kg)  12/14/13 258 lb 4 oz (117.141 kg)   BP Readings from Last 3 Encounters:  11/09/14 155/72  06/15/14 127/76  12/14/13 110/62    Past Medical History  Diagnosis Date  . Asthma   . Arthritis   . Depression   . Headache(784.0)   . GERD (gastroesophageal reflux disease)   . Allergy   . Hypertension   . Hyperlipidemia   . Migraine   . Diabetes mellitus 2007  . Enlarged prostate   . Sinusitis    Family History  Problem Relation Age of Onset  . Stroke Mother   . Kidney disease Mother   . Diabetes Mother   . Cancer Father   . Stroke Father   . Kidney disease Father   . Cancer Other     colon, breast   Past Surgical History  Procedure Laterality Date  . Back surgery  10/2003    ruptured disc, L4-5, [facility]  . Tonsillectomy and adenoidectomy  1960  . Prostate biopsy  2014    Dr. Jacqlyn Larsen  . Hemangioma excision  2014    right  upper arm, Dr. Ronalee Belts   Social History   Social History  . Marital Status: Married    Spouse Name: N/A  . Number of Children: N/A  . Years of Education: N/A   Social History Main Topics  . Smoking status: Former Research scientist (life sciences)  . Smokeless tobacco: Never Used  . Alcohol Use: No  . Drug Use: No  . Sexual Activity: Not Asked   Other Topics Concern  . None   Social History Narrative   Lives in [place]. 1 son lives in [place].      Work - Disabled, previously Museum/gallery conservator and railroad.      Diet - regular   Exercise - ellipitical machine at home    Review of Systems  Constitutional: Negative for fever, chills, activity change, appetite change, fatigue and unexpected weight change.  Eyes: Negative for visual disturbance.  Respiratory: Negative for cough and shortness of breath.   Cardiovascular: Negative for chest pain, palpitations and leg swelling.  Gastrointestinal: Positive for nausea, vomiting, abdominal pain and abdominal distention. Negative for diarrhea, constipation and blood in stool.  Genitourinary: Negative for dysuria, urgency and difficulty urinating.  Musculoskeletal: Negative for myalgias, arthralgias and gait problem.  Skin: Negative for color change and rash.  Neurological: Positive for syncope (after taking pain medication). Negative for dizziness, facial asymmetry, weakness, light-headedness, numbness and headaches.  Hematological: Negative for adenopathy.  Psychiatric/Behavioral: Negative for sleep disturbance and dysphoric mood. The patient is not nervous/anxious.        Objective:    BP 155/72 mmHg  Pulse 85  Temp(Src) 98 F (36.7 C) (Oral)  Ht 5' 9.25" (1.759 m)  Wt 247 lb (112.038 kg)  BMI 36.21 kg/m2  SpO2 98% Physical Exam  Constitutional: He is oriented to person, place, and time. He appears well-developed and well-nourished. No distress.  HENT:  Head: Normocephalic and atraumatic.  Right Ear: External ear normal.  Left Ear:  External ear normal.  Nose: Nose normal.  Mouth/Throat: Oropharynx is clear and moist. No oropharyngeal exudate.  Eyes: Conjunctivae and EOM are normal. Pupils are equal, round, and reactive to light. Right eye exhibits no discharge. Left eye exhibits no discharge. No scleral icterus.  Neck: Normal range of motion. Neck supple. No tracheal deviation present. No thyromegaly present.  Cardiovascular: Normal rate, regular rhythm and normal heart sounds.  Exam reveals no gallop and no friction rub.   No murmur heard. Pulmonary/Chest: Effort normal and breath sounds normal. No respiratory distress. He has no wheezes. He has no rales. He exhibits no tenderness.  Abdominal: Soft. Bowel sounds are normal. He exhibits no distension and no mass. There is no tenderness. There is no rebound and no guarding.  Musculoskeletal: Normal range of motion. He exhibits no edema.  Lymphadenopathy:    He has no cervical adenopathy.  Neurological: He is alert and oriented to person, place, and time. No cranial nerve deficit. Coordination normal.  Skin: Skin is warm and dry. No rash noted. He is not diaphoretic. No erythema. No pallor.  Psychiatric: He has a normal mood and affect. His behavior is normal. Judgment and thought content normal.          Assessment & Plan:   Problem List Items Addressed This Visit      Unprioritized   Coronary atherosclerosis of native coronary artery    Incidental finding of 3v CAD on CT scan. EKG today shows NSR with no acute process.. Risks for CAD including DM, obesity, HL, HTN. Will continue aspirin, statin. Will set up cardiology evaluation for possible stress test. Follow up 4 weeks.      Relevant Orders   EKG 12-Lead (Completed)   Ambulatory referral to Cardiology   Diabetes mellitus type 2, controlled ([place])    Pt does not check BG. Will check A1c with labs.      Relevant Orders   Comprehensive metabolic panel   Hemoglobin A1c   Ileus (HCC) - Primary    Recent  ileus, seen at [facility] ED. Suspect viral infection causing symptoms. Symptoms have improved and he is tolerating a full diet, having regular BMs. Will monitor. Avoid pain medication. Call if recurrent symptoms. Recheck CMP and CBC today.      Relevant Orders   CBC       Return in about 4 weeks (around 12/07/2014) for Recheck.

## 2014-11-10 DIAGNOSIS — Z23 Encounter for immunization: Secondary | ICD-10-CM

## 2014-11-10 DIAGNOSIS — K567 Ileus, unspecified: Secondary | ICD-10-CM | POA: Diagnosis not present

## 2014-11-12 ENCOUNTER — Encounter: Payer: Self-pay | Admitting: Internal Medicine

## 2014-11-12 DIAGNOSIS — M533 Sacrococcygeal disorders, not elsewhere classified: Secondary | ICD-10-CM

## 2014-11-15 ENCOUNTER — Encounter: Payer: Self-pay | Admitting: Internal Medicine

## 2014-11-15 ENCOUNTER — Ambulatory Visit (INDEPENDENT_AMBULATORY_CARE_PROVIDER_SITE_OTHER)
Admission: RE | Admit: 2014-11-15 | Discharge: 2014-11-15 | Disposition: A | Discharge status: Home / Self Care | Payer: Medicare Other | Source: Ambulatory Visit | Attending: Internal Medicine | Admitting: Internal Medicine

## 2014-11-15 DIAGNOSIS — M533 Sacrococcygeal disorders, not elsewhere classified: Secondary | ICD-10-CM | POA: Diagnosis not present

## 2014-11-28 ENCOUNTER — Emergency Department
Admission: EM | Admit: 2014-11-28 | Discharge: 2014-11-28 | Disposition: A | Discharge status: Home / Self Care | Payer: Medicare Other | Attending: Emergency Medicine | Admitting: Emergency Medicine

## 2014-11-28 ENCOUNTER — Emergency Department: Payer: Medicare Other

## 2014-11-28 DIAGNOSIS — Z7982 Long term (current) use of aspirin: Secondary | ICD-10-CM | POA: Diagnosis not present

## 2014-11-28 DIAGNOSIS — E119 Type 2 diabetes mellitus without complications: Secondary | ICD-10-CM | POA: Diagnosis not present

## 2014-11-28 DIAGNOSIS — Z7984 Long term (current) use of oral hypoglycemic drugs: Secondary | ICD-10-CM | POA: Insufficient documentation

## 2014-11-28 DIAGNOSIS — I10 Essential (primary) hypertension: Secondary | ICD-10-CM | POA: Insufficient documentation

## 2014-11-28 DIAGNOSIS — Z87891 Personal history of nicotine dependence: Secondary | ICD-10-CM | POA: Insufficient documentation

## 2014-11-28 DIAGNOSIS — M545 Low back pain, unspecified: Secondary | ICD-10-CM

## 2014-11-28 DIAGNOSIS — Z7952 Long term (current) use of systemic steroids: Secondary | ICD-10-CM | POA: Insufficient documentation

## 2014-11-28 DIAGNOSIS — M549 Dorsalgia, unspecified: Secondary | ICD-10-CM | POA: Diagnosis not present

## 2014-11-28 DIAGNOSIS — S3992XA Unspecified injury of lower back, initial encounter: Secondary | ICD-10-CM | POA: Diagnosis not present

## 2014-11-28 DIAGNOSIS — Z79899 Other long term (current) drug therapy: Secondary | ICD-10-CM | POA: Diagnosis not present

## 2014-11-28 LAB — BASIC METABOLIC PANEL
Anion gap: 7 (ref 5–15)
BUN: 11 mg/dL (ref 6–20)
CHLORIDE: 106 mmol/L (ref 101–111)
CO2: 23 mmol/L (ref 22–32)
Calcium: 9.5 mg/dL (ref 8.9–10.3)
Creatinine, Ser: 0.89 mg/dL (ref 0.61–1.24)
GFR calc Af Amer: >60 mL/min (ref >60)
GFR calc non Af Amer: >60 mL/min (ref >60)
GLUCOSE: 167 mg/dL — AB (ref 65–99)
POTASSIUM: 4.2 mmol/L (ref 3.5–5.1)
Sodium: 136 mmol/L (ref 135–145)

## 2014-11-28 LAB — CBC
HCT: 45.9 % (ref 40.0–52.0)
HEMOGLOBIN: 15 g/dL (ref 13.0–18.0)
MCH: 28.5 pg (ref 26.0–34.0)
MCHC: 32.7 g/dL (ref 32.0–36.0)
MCV: 87 fL (ref 80.0–100.0)
Platelets: 159 10*3/uL (ref 150–440)
RBC: 5.27 MIL/uL (ref 4.40–5.90)
RDW: 13.7 % (ref 11.5–14.5)
WBC: 9.5 10*3/uL (ref 3.8–10.6)

## 2014-11-28 MED ORDER — CYCLOBENZAPRINE HCL 10 MG PO TABS
5.0000 mg | ORAL_TABLET | Freq: Once | ORAL | Status: AC
  Administered 2014-11-28: 5 mg via ORAL
  Filled 2014-11-28: qty 1

## 2014-11-28 MED ORDER — CYCLOBENZAPRINE HCL 10 MG PO TABS: 10.0000 mg | ORAL_TABLET | Freq: Three times a day (TID) | ORAL | Status: DC | PRN

## 2014-11-28 MED ORDER — OXYCODONE-ACETAMINOPHEN 5-325 MG PO TABS
1.0000 | ORAL_TABLET | ORAL | Status: AC
  Administered 2014-11-28: 1 via ORAL
  Filled 2014-11-28: qty 1

## 2014-11-28 MED ORDER — MORPHINE SULFATE (PF) 2 MG/ML IV SOLN
INTRAVENOUS | Status: AC
  Filled 2014-11-28: qty 1

## 2014-11-28 MED ORDER — MORPHINE SULFATE (PF) 4 MG/ML IV SOLN
6.0000 mg | Freq: Once | INTRAVENOUS | Status: AC
  Administered 2014-11-28: 6 mg via INTRAVENOUS
  Filled 2014-11-28: qty 1

## 2014-11-28 MED ORDER — SODIUM CHLORIDE 0.9 % IV BOLUS (SEPSIS)
500.0000 mL | Freq: Once | INTRAVENOUS | Status: AC
  Administered 2014-11-28: 500 mL via INTRAVENOUS

## 2014-11-28 MED ORDER — IBUPROFEN 600 MG PO TABS
600.0000 mg | ORAL_TABLET | ORAL | Status: AC
  Administered 2014-11-28: 600 mg via ORAL
  Filled 2014-11-28: qty 1

## 2014-11-28 NOTE — Discharge Instructions (Signed)
Please follow up with your doctor as soon as possible regarding today's ED visit and your back pain.  Return to the ED for worsening back pain, fever, weakness or numbness of either leg, or if you develop either (1) an inability to urinate or have bowel movements, or (2) loss of your ability to control your bathroom functions (if you start having "accidents"), or if you develop other new symptoms that concern you.   Back Pain, Adult Back pain is very common in adults.The cause of back pain is rarely dangerous and the pain often gets better over time.The cause of your back pain may not be known. Some common causes of back pain include:  Strain of the muscles or ligaments supporting the spine.  Wear and tear (degeneration) of the spinal disks.  Arthritis.  Direct injury to the back. For many people, back pain may return. Since back pain is rarely dangerous, most people can learn to manage this condition on their own. HOME CARE INSTRUCTIONS Watch your back pain for any changes. The following actions may help to lessen any discomfort you are feeling:  Remain active. It is stressful on your back to sit or stand in one place for long periods of time. Do not sit, drive, or stand in one place for more than 30 minutes at a time. Take short walks on even surfaces as soon as you are able.Try to increase the length of time you walk each day.  Exercise regularly as directed by your health care provider. Exercise helps your back heal faster. It also helps avoid future injury by keeping your muscles strong and flexible.  Do not stay in bed.Resting more than 1-2 days can delay your recovery.  Pay attention to your body when you bend and lift. The most comfortable positions are those that put less stress on your recovering back. Always use proper lifting techniques, including:  Bending your knees.  Keeping the load close to your body.  Avoiding twisting.  Find a comfortable position to sleep. Use  a firm mattress and lie on your side with your knees slightly bent. If you lie on your back, put a pillow under your knees.  Avoid feeling anxious or stressed.Stress increases muscle tension and can worsen back pain.It is important to recognize when you are anxious or stressed and learn ways to manage it, such as with exercise.  Take medicines only as directed by your health care provider. Over-the-counter medicines to reduce pain and inflammation are often the most helpful.Your health care provider may prescribe muscle relaxant drugs.These medicines help dull your pain so you can more quickly return to your normal activities and healthy exercise.  Apply ice to the injured area:  Put ice in a plastic bag.  Place a towel between your skin and the bag.  Leave the ice on for 20 minutes, 2-3 times a day for the first 2-3 days. After that, ice and heat may be alternated to reduce pain and spasms.  Maintain a healthy weight. Excess weight puts extra stress on your back and makes it difficult to maintain good posture. SEEK MEDICAL CARE IF:  You have pain that is not relieved with rest or medicine.  You have increasing pain going down into the legs or buttocks.  You have pain that does not improve in one week.  You have night pain.  You lose weight.  You have a fever or chills. SEEK IMMEDIATE MEDICAL CARE IF:   You develop new bowel or bladder control  problems.  You have unusual weakness or numbness in your arms or legs.  You develop nausea or vomiting.  You develop abdominal pain.  You feel faint.   This information is not intended to replace advice given to you by your health care provider. Make sure you discuss any questions you have with your health care provider.   Document Released: 01/01/2005 Document Revised: 01/22/2014 Document Reviewed: 05/05/2013 Elsevier Interactive Patient Education [employer].

## 2014-11-28 NOTE — ED Provider Notes (Signed)
[facility] Emergency Department Provider Note REMINDER - THIS NOTE IS NOT A FINAL MEDICAL RECORD UNTIL IT IS SIGNED. UNTIL THEN, THE CONTENT BELOW MAY REFLECT INFORMATION FROM A DOCUMENTATION TEMPLATE, NOT THE ACTUAL PATIENT VISIT. ____________________________________________  Time seen: Approximately 11:10 AM  I have reviewed the triage vital signs and the nursing notes.   HISTORY  Chief Complaint Back Pain    HPI Dakota Gonzalez is a 69 y.o. male reports that he's been having increasing low back pain. Patient reports he had back pain for several years, he had lumbar surgery years ago as well. He reports that occasionally this pain will flare up, but about 2 weeks ago he was at [facility] and was told he had a partial bowel obstruction, and was sent home. He then had a fall onto his buttock and since that time he had increasing low back pain. He evidently had x-rays performed at some point, and reports that they were told normal without evidence of injury except he bruised his tailbone severely. He has oxycodone at home, he has not taken any yet today but he reports that he was in so much pain in his low back when he tried to sit up this morning he was unable to get out of bed.  Denies numbness or tingling, no weakness in the legs. He reports his pain comes and goes at times and is been previously diagnosed with similar symptoms. He denies any abdominal pain, he has not had any further nausea or vomiting or abdominal symptoms like he did a couple weeks ago and he is diagnosed with a "partial obstruction". Reports is moving his bowels well and no issues there.  No fevers or chills. No chest pain or trouble breathing. His primary concern is getting his pain under control, and also further evaluation with diagnosis.   Past Medical History  Diagnosis Date  . Asthma   . Arthritis   . Depression   . Headache(784.0)   . GERD (gastroesophageal reflux disease)    . Allergy   . Hypertension   . Hyperlipidemia   . Migraine   . Diabetes mellitus 2007  . Enlarged prostate   . Sinusitis     Patient Active Problem List   Diagnosis Date Noted  . Coronary atherosclerosis of native coronary artery 11/09/2014  . Ileus ([place]) 11/09/2014  . Bereavement 06/15/2014  . 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  . Obesity (BMI 30-39.9) 03/02/2013  . Hemangioma 11/14/2012  . HTN (hypertension) 02/26/2012  . Restless legs 01/21/2012  . Medicare annual wellness visit, subsequent 10/17/2011  . Diabetes mellitus type 2, controlled ([place]) 07/09/2011  . Hyperlipidemia 07/09/2011  . Elevated PSA 07/09/2011  . Chronic back pain 07/09/2011    Past Surgical History  Procedure Laterality Date  . Back surgery  10/2003    ruptured disc, L4-5, [facility]  . Tonsillectomy and adenoidectomy  1960  . Prostate biopsy  2014    Dr. Jacqlyn Larsen  . Hemangioma excision  2014    right upper arm, Dr. Ronalee Belts    Current Outpatient Rx  Name  Route  Sig  Dispense  Refill  . aspirin 81 MG tablet   Oral   Take 81 mg by mouth daily.         . Blood Glucose Monitoring Suppl (ONE TOUCH ULTRA SYSTEM KIT) W/DEVICE KIT   Does not apply   1 kit by Does not apply route once.   1 each   0   .  Coenzyme Q10 (COQ-10) 100 MG CAPS   Oral   Take 1 tablet by mouth daily.         . finasteride (PROSCAR) 5 MG tablet   Oral   Take 5 mg by mouth daily.         Marland Kitchen glimepiride (AMARYL) 4 MG tablet      TAKE 1 TABLET BY MOUTH EVERY DAY   90 tablet   1   . glucose blood test strip      One Touch Ultra Blue Test Strips- use one twice daily as needed to check blood glucose level.   50 each   11   . lisinopril (PRINIVIL,ZESTRIL) 40 MG tablet      TAKE 1 TABLET BY MOUTH EVERY DAY   90 tablet   0   . Multiple Vitamins-Minerals (CENTRUM SILVER PO)   Oral   Take 1 tablet by mouth daily.         . ONE TOUCH LANCETS MISC      Use as directed. One Touch Delica. Dx:  250.00   100 each   5   . rOPINIRole (REQUIP) 0.5 MG tablet      TAKE 1 TABLET BY MOUTH AT BEDTIME   90 tablet   0   . S-Adenosylmethionine (SAM-E COMPLETE PO)   Oral   Take by mouth.         . simvastatin (ZOCOR) 40 MG tablet      TAKE 1 TABLET BY MOUTH EVERY DAY IN EVENING   90 tablet   3   . tamsulosin (FLOMAX) 0.4 MG CAPS   Oral   Take 0.4 mg by mouth daily.           Allergies Aspartame  Family History  Problem Relation Age of Onset  . Stroke Mother   . Kidney disease Mother   . Diabetes Mother   . Cancer Father   . Stroke Father   . Kidney disease Father   . Cancer Other     colon, breast    Social History Social History  Substance Use Topics  . Smoking status: Former Research scientist (life sciences)  . Smokeless tobacco: Never Used  . Alcohol Use: No    Review of Systems Constitutional: No fever/chills Eyes: No visual changes. ENT: No sore throat. Cardiovascular: Denies chest pain. Respiratory: Denies shortness of breath. Gastrointestinal: No abdominal pain.  No nausea, no vomiting.  No diarrhea.  No constipation. Genitourinary: Negative for dysuria. No trouble urinating. Musculoskeletal: Pain in his very low lumbar back, otherwise no discomfort. Occasionally it radiates out slightly to the right buttock. Denies pain in the legs. No numbness or tingling. Skin: Negative for rash. Neurological: Negative for headaches, focal weakness or numbness.  10-point ROS otherwise negative.  ____________________________________________   PHYSICAL EXAM:  VITAL SIGNS: ED Triage Vitals  Enc Vitals Group     BP 11/28/14 0955 159/80 mmHg     Pulse Rate 11/28/14 0955 91     Resp --      Temp 11/28/14 0955 97.8 F (36.6 C)     Temp Source 11/28/14 0955 Oral     SpO2 11/28/14 0955 98 %     Weight 11/28/14 0955 240 lb (108.863 kg)     Height 11/28/14 0955 '5\' 10"'  (1.778 m)     Head Cir --      Peak Flow --      Pain Score 11/28/14 0956 10     Pain Loc --  Pain Edu? --       Excl. in [place]? --    Constitutional: Alert and oriented. Well appearing and in no acute distress. Eyes: Conjunctivae are normal. PERRL. EOMI. Head: Atraumatic. Nose: No congestion/rhinnorhea. Mouth/Throat: Mucous membranes are moist.  Oropharynx non-erythematous. Neck: No stridor.  No cervical or thoracic tenderness. Cardiovascular: Normal rate, regular rhythm. Grossly normal heart sounds.  Good peripheral circulation. Respiratory: Normal respiratory effort.  No retractions. Lungs CTAB. Gastrointestinal: Soft and nontender. No distention. No abdominal bruits. No CVA tenderness. No perirectal anesthesia. Musculoskeletal: No lower extremity tenderness nor edema.  No joint effusions.  Lower Extremities  No edema. Normal DP/PT pulses bilateral with good cap refill.  Normal neuro-motor function lower extremities bilateral.  RIGHT Right lower extremity demonstrates normal strength, good use of all muscles. No edema bruising or contusions of the right hip, right knee, right ankle. Full range of motion of the right lower extremity without pain. No pain on axial loading. No evidence of trauma.  LEFT Left lower extremity demonstrates normal strength, good use of all muscles. No edema bruising or contusions of the hip,  knee, ankle. Full range of motion of the left lower extremity without pain. No pain on axial loading. No evidence of trauma.   Neurologic:  Normal speech and language. No gross focal neurologic deficits are appreciated. Patient does straight 5 out of 5 strength lower extremity is bilaterally without deficit. He does have notable tenderness along the low lumbar spine approximately L3-L5, that causes some pain to radiate out to his lower back. He does have a positive straight leg raise on the right. Skin:  Skin is warm, dry and intact. No rash noted. Psychiatric: Mood and affect are normal. Speech and behavior are normal.  ____________________________________________    LABS (all labs ordered are listed, but only abnormal results are displayed)  Labs Reviewed  BASIC METABOLIC PANEL - Abnormal; Notable for the following:    Glucose, Bld 167 (*)    All other components within normal limits  CBC   ____________________________________________  EKG   ____________________________________________  RADIOLOGY  CT Lumbar Spine Wo Contrast (Final result) Result time: 11/28/14 12:03:05   Final result by Rad Results In Interface (11/28/14 12:03:05)   Narrative:   CLINICAL DATA: Chronic low back pain which became severe 2 days ago. History of fall October, 2016. Initial encounter.  EXAM: CT LUMBAR SPINE WITHOUT CONTRAST  TECHNIQUE: Multidetector CT imaging of the lumbar spine was performed without intravenous contrast administration. Multiplanar CT image reconstructions were also generated.  COMPARISON: CT abdomen and pelvis 11/07/2014.  FINDINGS: Vertebral body height and alignment are maintained. Aortoiliac atherosclerosis without aneurysm is identified. There is partial visualization of a cyst in the left kidney measuring approximately 5.7 cm in diameter. A second cyst in the lower pole of the left kidney measures 3.2 cm in diameter. Retroaortic left renal vein is noted. Imaged intra-abdominal contents are otherwise unremarkable.  T12-L1: Mild facet degenerative disease. Otherwise negative.  L1-2: There is a shallow disc bulge and endplate spur eccentric to the right. There appears to be mild narrowing in the right lateral recess. The foramina are open.  L2-3: Shallow broad-based disc bulge and mild facet degenerative change are seen. The central canal and foramina appear open.  L3-4: Shallow disc bulge appears to cause mild central canal narrowing. Mild to moderate foraminal narrowing appears worse on the left.  L4-5: Advanced facet degenerative disease is seen on the right. The patient appears to be status post partial  resection of the left facet joint for foraminotomy. There is a shallow broad-based disc bulge. Mild to moderate central canal narrowing is identified. Moderate to moderately severe right and mild to moderate left foraminal narrowing is noted.  L5-S1: Moderate bilateral facet degenerative disease is present. There is a shallow disc bulge with endplate spurring. The central canal is open. Moderate to moderately severe bilateral foraminal narrowing is identified.  IMPRESSION: No acute abnormality.  Multilevel mild to moderate spondylosis appearing most notable at L4-5 and L5-S1 as described above.  Aortoiliac atherosclerosis without aneurysm.         It is notable that I did consider diagnoses of intra-abdominal sources, including dissection and aortic aneurysm however the patient had a recent CT at the end of October at [facility] which demonstrated no evidence of AAA or dissection, which I think makes this diagnosis extremely unlikely and feel that he would not require further workup today for acute vascular cause of his back pain given his reassuring CT from a few weeks ago as well as reassuring examination and history of previous similar symptoms. From CT only a few weeks ago: "Atherosclerosis throughout the abdominal and pelvic vasculature, without evidence of aneurysm or dissection." ____________________________________________   PROCEDURES  Procedure(s) performed: None  Critical Care performed: No  ____________________________________________   INITIAL IMPRESSION / ASSESSMENT AND PLAN / ED COURSE  Pertinent labs & imaging results that were available during my care of the patient were reviewed by me and considered in my medical decision making (see chart for details).  Patient presents with acute worsening of what appears to be chronic low back pain after traumatic injury. Reports negative x-rays recently, but given his ongoing grief going pain I am suspicious  he could have a small herniated nucleus pulposis without evidence of neurovascular compromise, or otherwise small traumatic injury that was missed on x-ray including compression fracture. He has no signs or symptoms of abdominal pain, no evidence of systemic symptoms to suggest fever or infection, at this point we will obtain CT scan, hydrate, and pain control. If CT does not demonstrate an obvious source, I would control his pain and recommend outpatient follow-up. The patient is agreeable, we did discuss MRI, but after discussion of the risks benefits and costs the patient elected that CT scan, which I think is also appropriate as I do not believe the patient shows any signs or symptoms of cauda equina or need for emergent neurosurgical intervention at this time.  ----------------------------------------- 12:58 PM on 11/28/2014 -----------------------------------------  Patient reports improvement in his pain. He is in no distress resting, but does state the pain is starting to come back. He takes oxycodone at home provided him with a Percocet here in the ER, and we will give him some Flexeril which she states he is use previously in the past with good help. He does report that he feels unable to walk okay with a cane, which she has done frequently. We will ambulate him, and test his skills with ambulation. If pain is reasonably controlled to plan to discharge him to home, will give him a prescription for Flexeril and advised him on close follow-up care with orthopedics. Patient is agreeable, states he seen orthopedic/spine specialist in the past and he will contact for follow-up as well, but understands he can return here for follow-up.  He does report he is concerned he could have spasms at home, encouraged him that he should take caution be careful while ambulating at home, at the present  time I see no indication for need for admission or acute neurosurgical intervention. Careful return precautions  discussed as well as no driving and safe use of his home medications. He is agreeable. ____________________________________________   FINAL CLINICAL IMPRESSION(S) / ED DIAGNOSES  Final diagnoses:  Right-sided low back pain without sciatica   Friend from church assisting patient in driving him home.   Delman Kitten, MD 11/28/14 1302

## 2014-11-28 NOTE — ED Notes (Signed)
Pt C/O continuous central lower back pain that has worsened

## 2014-11-28 NOTE — ED Notes (Signed)
BIB EMS. Pt c/o back pain. Pt has history of back problems. Pt sts he fell in October and had x-rays done. Pt sts nothing was broken and was told it was badly bruised and has degenterative discs. Pt was given pain meds. Pt sts he has since been in a lot of pain was able to manage it. Pt sts 2 days ago he started having severe back pain. Pt reports spasms and severe pain with movement.

## 2014-11-28 NOTE — ED Notes (Signed)
Pt ambulated with steady gait for 20 steps.

## 2014-11-29 ENCOUNTER — Telehealth: Payer: Self-pay

## 2014-11-29 NOTE — Telephone Encounter (Signed)
30 min follow up appt scheduled.

## 2014-11-29 NOTE — Telephone Encounter (Signed)
I received a phone message from patient stating that he went to the ER yesterday for back pain. Patient states that he has seen Dr. Gilford Rile for this back pain as well - and she normally prescribes a medication to help with this (patient cannot recall name of the medication). Patient was wondering if that medication can be sent in for him? Thanks!

## 2014-11-29 NOTE — Telephone Encounter (Signed)
We should bring him in for a follow up. 14min

## 2014-11-30 ENCOUNTER — Encounter: Payer: Self-pay | Admitting: Internal Medicine

## 2014-12-01 ENCOUNTER — Ambulatory Visit (INDEPENDENT_AMBULATORY_CARE_PROVIDER_SITE_OTHER): Payer: Medicare Other | Admitting: Internal Medicine

## 2014-12-01 ENCOUNTER — Encounter: Payer: Self-pay | Admitting: Internal Medicine

## 2014-12-01 VITALS — BP 151/90 | HR 106 | Temp 98.0°F | Ht 69.25 in | Wt 247.1 lb

## 2014-12-01 DIAGNOSIS — K5909 Other constipation: Secondary | ICD-10-CM | POA: Diagnosis not present

## 2014-12-01 DIAGNOSIS — M549 Dorsalgia, unspecified: Secondary | ICD-10-CM

## 2014-12-01 DIAGNOSIS — G8929 Other chronic pain: Secondary | ICD-10-CM

## 2014-12-01 DIAGNOSIS — K5900 Constipation, unspecified: Secondary | ICD-10-CM | POA: Insufficient documentation

## 2014-12-01 MED ORDER — OXYCODONE-ACETAMINOPHEN 5-325 MG PO TABS: 1.0000 | ORAL_TABLET | Freq: Three times a day (TID) | ORAL | Status: DC | PRN

## 2014-12-01 MED ORDER — CYCLOBENZAPRINE HCL 10 MG PO TABS: 10.0000 mg | ORAL_TABLET | Freq: Three times a day (TID) | ORAL | Status: DC | PRN

## 2014-12-01 NOTE — Assessment & Plan Note (Signed)
Secondary to opiate use. Will taper down medication. Continue Miralax. Discussed adding Lactulose or Amitiza. He will email with update tomorrow.

## 2014-12-01 NOTE — Patient Instructions (Addendum)
Email tomorrow with update.  We can consider adding Prednisone if pain not improving.  We can also consider evaluation with Dr. Sharlet Salina if pain persistent.

## 2014-12-01 NOTE — Assessment & Plan Note (Signed)
Reviewed recent CT. Will continue Flexeril prn and try to taper down Oxycodone. Discussed potential referral to Dr. Sharlet Salina for [facility], but will hold off for now. He will email with follow up.

## 2014-12-01 NOTE — Progress Notes (Signed)
Pre visit review using our clinic review tool, if applicable. No additional management support is needed unless otherwise documented below in the visit note. 

## 2014-12-01 NOTE — Progress Notes (Signed)
Subjective:    Patient ID: Dakota Gonzalez, male    DOB: [birth year], 69 y.o.   MRN: [id number]  HPI  69YO male presents for acute visit.  Recent worsening of back pain. Seen in ED. Started on Flexeril. Pain has improved some. Taking Oxycodone 1 tab twice daily and Flexeril every 8hr.  Constipation - Severe over last few days. No abdominal pain. No NV.  He started taking Miralax twice daily yesterday. No BM yet.   Wt Readings from Last 3 Encounters:  12/01/14 247 lb 2 oz (112.095 kg)  11/28/14 240 lb (108.863 kg)  11/09/14 247 lb (112.038 kg)   BP Readings from Last 3 Encounters:  12/01/14 151/90  11/28/14 145/80  11/09/14 155/72    Past Medical History  Diagnosis Date  . Asthma   . Arthritis   . Depression   . Headache(784.0)   . GERD (gastroesophageal reflux disease)   . Allergy   . Hypertension   . Hyperlipidemia   . Migraine   . Diabetes mellitus 2007  . Enlarged prostate   . Sinusitis    Family History  Problem Relation Age of Onset  . Stroke Mother   . Kidney disease Mother   . Diabetes Mother   . Cancer Father   . Stroke Father   . Kidney disease Father   . Cancer Other     colon, breast   Past Surgical History  Procedure Laterality Date  . Back surgery  10/2003    ruptured disc, L4-5, [facility]  . Tonsillectomy and adenoidectomy  1960  . Prostate biopsy  2014    Dr. Jacqlyn Larsen  . Hemangioma excision  2014    right upper arm, Dr. Ronalee Belts   Social History   Social History  . Marital Status: Widowed    Spouse Name: N/A  . Number of Children: N/A  . Years of Education: N/A   Social History Main Topics  . Smoking status: Former Research scientist (life sciences)  . Smokeless tobacco: Never Used  . Alcohol Use: No  . Drug Use: No  . Sexual Activity: Not Asked   Other Topics Concern  . None   Social History Narrative   Lives in [place]. 1 son lives in [place].      Work - Disabled, previously Museum/gallery conservator and railroad.      Diet - regular   Exercise - ellipitical machine at home    Review of Systems  Constitutional: Negative for fever, chills, activity change, appetite change, fatigue and unexpected weight change.  Eyes: Negative for visual disturbance.  Respiratory: Negative for cough and shortness of breath.   Cardiovascular: Negative for chest pain, palpitations and leg swelling.  Gastrointestinal: Positive for constipation. Negative for nausea, vomiting, abdominal pain, diarrhea and abdominal distention.  Genitourinary: Negative for dysuria, urgency and difficulty urinating.  Musculoskeletal: Positive for myalgias, back pain and arthralgias. Negative for gait problem.  Skin: Negative for color change and rash.  Neurological: Negative for weakness and numbness.  Hematological: Negative for adenopathy.  Psychiatric/Behavioral: Negative for sleep disturbance and dysphoric mood. The patient is not nervous/anxious.        Objective:    BP 151/90 mmHg  Pulse 106  Temp(Src) 98 F (36.7 C) (Oral)  Ht 5' 9.25" (1.759 m)  Wt 247 lb 2 oz (112.095 kg)  BMI 36.23 kg/m2  SpO2 97% Physical Exam  Constitutional: He is oriented to person, place, and time. He appears well-developed and well-nourished. No distress.  HENT:  Head:  Normocephalic and atraumatic.  Right Ear: External ear normal.  Left Ear: External ear normal.  Nose: Nose normal.  Mouth/Throat: Oropharynx is clear and moist. No oropharyngeal exudate.  Eyes: Conjunctivae and EOM are normal. Pupils are equal, round, and reactive to light. Right eye exhibits no discharge. Left eye exhibits no discharge. No scleral icterus.  Neck: Normal range of motion. Neck supple. No tracheal deviation present. No thyromegaly present.  Cardiovascular: Normal rate, regular rhythm and normal heart sounds.  Exam reveals no gallop and no friction rub.   No murmur heard. Pulmonary/Chest: Effort normal and breath sounds normal. No accessory muscle usage. No tachypnea. No respiratory  distress. He has no decreased breath sounds. He has no wheezes. He has no rhonchi. He has no rales. He exhibits no tenderness.  Abdominal: Soft. Bowel sounds are normal. He exhibits no distension. There is no tenderness.  Musculoskeletal: Normal range of motion. He exhibits no edema.  Lymphadenopathy:    He has no cervical adenopathy.  Neurological: He is alert and oriented to person, place, and time. No cranial nerve deficit. Coordination normal.  Skin: Skin is warm and dry. No rash noted. He is not diaphoretic. No erythema. No pallor.  Psychiatric: He has a normal mood and affect. His behavior is normal. Judgment and thought content normal.          Assessment & Plan:   Problem List Items Addressed This Visit      Unprioritized   Chronic back pain - Primary    Reviewed recent CT. Will continue Flexeril prn and try to taper down Oxycodone. Discussed potential referral to Dr. Sharlet Salina for [facility], but will hold off for now. He will email with follow up.      Relevant Medications   oxyCODONE-acetaminophen (PERCOCET/ROXICET) 5-325 MG tablet   cyclobenzaprine (FLEXERIL) 10 MG tablet   Constipation    Secondary to opiate use. Will taper down medication. Continue Miralax. Discussed adding Lactulose or Amitiza. He will email with update tomorrow.          Return in about 2 weeks (around 12/15/2014) for Wellness Visit.

## 2014-12-02 ENCOUNTER — Encounter: Payer: Self-pay | Admitting: Internal Medicine

## 2014-12-02 ENCOUNTER — Other Ambulatory Visit: Payer: Self-pay | Admitting: Internal Medicine

## 2014-12-02 MED ORDER — LACTULOSE 20 GM/30ML PO SOLN: 30.0000 mL | ORAL | Status: DC | PRN

## 2014-12-05 ENCOUNTER — Encounter: Payer: Self-pay | Admitting: Internal Medicine

## 2014-12-06 MED ORDER — PREDNISONE 10 MG PO TABS: ORAL_TABLET | ORAL | Status: DC

## 2014-12-15 ENCOUNTER — Encounter: Payer: Self-pay | Admitting: Internal Medicine

## 2014-12-15 ENCOUNTER — Ambulatory Visit (INDEPENDENT_AMBULATORY_CARE_PROVIDER_SITE_OTHER): Payer: Medicare Other | Admitting: Internal Medicine

## 2014-12-15 VITALS — BP 121/81 | HR 85 | Temp 98.0°F | Ht 69.0 in | Wt 243.5 lb

## 2014-12-15 DIAGNOSIS — I10 Essential (primary) hypertension: Secondary | ICD-10-CM

## 2014-12-15 DIAGNOSIS — Z634 Disappearance and death of family member: Secondary | ICD-10-CM | POA: Diagnosis not present

## 2014-12-15 DIAGNOSIS — E1159 Type 2 diabetes mellitus with other circulatory complications: Secondary | ICD-10-CM | POA: Diagnosis not present

## 2014-12-15 DIAGNOSIS — M549 Dorsalgia, unspecified: Secondary | ICD-10-CM

## 2014-12-15 DIAGNOSIS — G8929 Other chronic pain: Secondary | ICD-10-CM

## 2014-12-15 DIAGNOSIS — K5909 Other constipation: Secondary | ICD-10-CM

## 2014-12-15 NOTE — Assessment & Plan Note (Signed)
Resolved after using Lactulose and with avoidance of Oxycodone. Follow up prn.

## 2014-12-15 NOTE — Progress Notes (Signed)
Subjective:    Patient ID: Dakota Gonzalez, male    DOB: 1945/03/10, 69 y.o.   MRN: [id number]  HPI  69YO male presents for follow up.  Last seen 11/16 for back pain.  BG more elevated on recent prednisone taper. BG near 200s. However, noted some improvement with Prednisone. Pain improving daily. Taking Flexeril about every 2-3 days with improvement in pain. No longer using Oxycodone. Pain is aching in lower back, exacerbated by movement. Improved with rest. Does not radiate. No weakness.  Constipation has resolved.  Notes some sadness during the holidays. First holiday without his wife. Has strong support from his church group and sister. Does not want to start medication to help with this.    Wt Readings from Last 3 Encounters:  12/15/14 243 lb 8 oz (110.451 kg)  12/01/14 247 lb 2 oz (112.095 kg)  11/28/14 240 lb (108.863 kg)   BP Readings from Last 3 Encounters:  12/15/14 121/81  12/01/14 151/90  11/28/14 145/80    Past Medical History  Diagnosis Date  . Asthma   . Arthritis   . Depression   . Headache(784.0)   . GERD (gastroesophageal reflux disease)   . Allergy   . Hypertension   . Hyperlipidemia   . Migraine   . Diabetes mellitus 2007  . Enlarged prostate   . Sinusitis    Family History  Problem Relation Age of Onset  . Stroke Mother   . Kidney disease Mother   . Diabetes Mother   . Cancer Father   . Stroke Father   . Kidney disease Father   . Cancer Other     colon, breast   Past Surgical History  Procedure Laterality Date  . Back surgery  10/2003    ruptured disc, L4-5, [facility]  . Tonsillectomy and adenoidectomy  1960  . Prostate biopsy  2014    Dr. Jacqlyn Larsen  . Hemangioma excision  2014    right upper arm, Dr. Ronalee Belts   Social History   Social History  . Marital Status: Widowed    Spouse Name: N/A  . Number of Children: N/A  . Years of Education: N/A   Social History Main Topics  . Smoking status: Former Research scientist (life sciences)  .  Smokeless tobacco: Never Used  . Alcohol Use: No  . Drug Use: No  . Sexual Activity: Not Asked   Other Topics Concern  . None   Social History Narrative   Lives in [place]. 1 son lives in [place].      Work - Disabled, previously Museum/gallery conservator and railroad.      Diet - regular   Exercise - ellipitical machine at home    Review of Systems  Constitutional: Negative for fever, chills, activity change, appetite change, fatigue and unexpected weight change.  Eyes: Negative for visual disturbance.  Respiratory: Negative for cough and shortness of breath.   Cardiovascular: Negative for chest pain, palpitations and leg swelling.  Gastrointestinal: Negative for nausea, vomiting, abdominal pain, diarrhea, constipation and abdominal distention.  Genitourinary: Negative for dysuria, urgency and difficulty urinating.  Musculoskeletal: Positive for myalgias, back pain and arthralgias. Negative for gait problem.  Skin: Negative for color change and rash.  Hematological: Negative for adenopathy.  Psychiatric/Behavioral: Positive for dysphoric mood. Negative for suicidal ideas and sleep disturbance. The patient is not nervous/anxious.        Objective:    BP 121/81 mmHg  Pulse 85  Temp(Src) 98 F (36.7 C) (Oral)  Ht  5\' 9"  (1.753 m)  Wt 243 lb 8 oz (110.451 kg)  BMI 35.94 kg/m2  SpO2 98% Physical Exam  Constitutional: He is oriented to person, place, and time. He appears well-developed and well-nourished. No distress.  HENT:  Head: Normocephalic and atraumatic.  Right Ear: External ear normal.  Left Ear: External ear normal.  Nose: Nose normal.  Mouth/Throat: Oropharynx is clear and moist. No oropharyngeal exudate.  Eyes: Conjunctivae and EOM are normal. Pupils are equal, round, and reactive to light. Right eye exhibits no discharge. Left eye exhibits no discharge. No scleral icterus.  Neck: Normal range of motion. Neck supple. No tracheal deviation present. No thyromegaly  present.  Cardiovascular: Normal rate, regular rhythm and normal heart sounds.  Exam reveals no gallop and no friction rub.   No murmur heard. Pulmonary/Chest: Effort normal and breath sounds normal. No accessory muscle usage. No tachypnea. No respiratory distress. He has no decreased breath sounds. He has no wheezes. He has no rhonchi. He has no rales. He exhibits no tenderness.  Musculoskeletal: Normal range of motion. He exhibits no edema.  Lymphadenopathy:    He has no cervical adenopathy.  Neurological: He is alert and oriented to person, place, and time. No cranial nerve deficit. Coordination normal.  Skin: Skin is warm and dry. No rash noted. He is not diaphoretic. No erythema. No pallor.  Psychiatric: His speech is normal and behavior is normal. Judgment and thought content normal. He exhibits a depressed mood. He expresses no suicidal ideation.          Assessment & Plan:   Problem List Items Addressed This Visit      Unprioritized   Bereavement    Offered support today. Discussed potentially adding an SSRI, but he prefers to hold for now.      Chronic back pain    Back pain improving some after Prednisone taper. Discussed referral to Dr. Sharlet Salina to evaluate for [facility], but he prefers to wait for now. Will continue prn Flexeril. Also discussed adding Cymbalta, but he prefers to wait. Follow up in 01/2015.      Constipation    Resolved after using Lactulose and with avoidance of Oxycodone. Follow up prn.      Diabetes mellitus type 2, controlled ([place]) - Primary    BG elevated on recent Prednisone taper. Will monitor for now. Continue Glimepiride. A1c with labs in 01/2015.      Relevant Orders   Comprehensive metabolic panel   Hemoglobin A1c   Lipid panel   HTN (hypertension)    BP Readings from Last 3 Encounters:  12/15/14 121/81  12/01/14 151/90  11/28/14 145/80   BP well controlled. Continue current medication.          Return in about 2 months (around  02/14/2015) for Recheck of Diabetes.

## 2014-12-15 NOTE — Assessment & Plan Note (Signed)
Back pain improving some after Prednisone taper. Discussed referral to Dr. Sharlet Salina to evaluate for [facility], but he prefers to wait for now. Will continue prn Flexeril. Also discussed adding Cymbalta, but he prefers to wait. Follow up in 01/2015.

## 2014-12-15 NOTE — Assessment & Plan Note (Addendum)
BG elevated on recent Prednisone taper. Will monitor for now. Continue Glimepiride. A1c with labs in 01/2015.

## 2014-12-15 NOTE — Assessment & Plan Note (Signed)
Offered support today. Discussed potentially adding an SSRI, but he prefers to hold for now.

## 2014-12-15 NOTE — Assessment & Plan Note (Signed)
BP Readings from Last 3 Encounters:  12/15/14 121/81  12/01/14 151/90  11/28/14 145/80   BP well controlled. Continue current medication.

## 2014-12-15 NOTE — Progress Notes (Signed)
Pre visit review using our clinic review tool, if applicable. No additional management support is needed unless otherwise documented below in the visit note. 

## 2014-12-15 NOTE — Patient Instructions (Addendum)
Consider starting Cymbalta to help with pain symptoms in the future.  Follow up in 2-3 months.

## 2014-12-27 ENCOUNTER — Encounter: Payer: Self-pay | Admitting: Internal Medicine

## 2014-12-27 ENCOUNTER — Other Ambulatory Visit: Payer: Self-pay

## 2014-12-27 MED ORDER — ROPINIROLE HCL 0.5 MG PO TABS: 0.5000 mg | ORAL_TABLET | Freq: Every day | ORAL | Status: DC

## 2014-12-29 DIAGNOSIS — E119 Type 2 diabetes mellitus without complications: Secondary | ICD-10-CM | POA: Diagnosis not present

## 2014-12-29 LAB — HM DIABETES EYE EXAM

## 2015-01-03 ENCOUNTER — Encounter: Payer: Self-pay | Admitting: Internal Medicine

## 2015-01-07 ENCOUNTER — Ambulatory Visit: Payer: Medicare Other | Admitting: Cardiovascular Disease

## 2015-01-24 ENCOUNTER — Other Ambulatory Visit: Payer: Self-pay | Admitting: Internal Medicine

## 2015-01-25 ENCOUNTER — Encounter: Payer: Self-pay | Admitting: Internal Medicine

## 2015-02-09 ENCOUNTER — Encounter: Payer: Self-pay | Admitting: Internal Medicine

## 2015-02-15 ENCOUNTER — Ambulatory Visit (INDEPENDENT_AMBULATORY_CARE_PROVIDER_SITE_OTHER): Payer: Medicare Other | Admitting: Internal Medicine

## 2015-02-15 ENCOUNTER — Encounter: Payer: Self-pay | Admitting: Internal Medicine

## 2015-02-15 VITALS — BP 150/88 | HR 105 | Temp 97.9°F | Ht 69.0 in | Wt 248.4 lb

## 2015-02-15 DIAGNOSIS — E1159 Type 2 diabetes mellitus with other circulatory complications: Secondary | ICD-10-CM

## 2015-02-15 DIAGNOSIS — M549 Dorsalgia, unspecified: Secondary | ICD-10-CM | POA: Diagnosis not present

## 2015-02-15 DIAGNOSIS — G8929 Other chronic pain: Secondary | ICD-10-CM

## 2015-02-15 DIAGNOSIS — I10 Essential (primary) hypertension: Secondary | ICD-10-CM

## 2015-02-15 DIAGNOSIS — I2510 Atherosclerotic heart disease of native coronary artery without angina pectoris: Secondary | ICD-10-CM

## 2015-02-15 LAB — COMPREHENSIVE METABOLIC PANEL
ALT: 29 U/L (ref 0–53)
AST: 24 U/L (ref 0–37)
Albumin: 4.8 g/dL (ref 3.5–5.2)
Alkaline Phosphatase: 52 U/L (ref 39–117)
BILIRUBIN TOTAL: 1.4 mg/dL — AB (ref 0.2–1.2)
BUN: 13 mg/dL (ref 6–23)
CALCIUM: 10 mg/dL (ref 8.4–10.5)
CO2: 28 meq/L (ref 19–32)
CREATININE: 0.93 mg/dL (ref 0.40–1.50)
Chloride: 100 mEq/L (ref 96–112)
GFR: 85.72 mL/min (ref >60.00)
Glucose, Bld: 133 mg/dL — ABNORMAL HIGH (ref 70–99)
Potassium: 3.8 mEq/L (ref 3.5–5.1)
SODIUM: 138 meq/L (ref 135–145)
Total Protein: 7.7 g/dL (ref 6.0–8.3)

## 2015-02-15 LAB — LIPID PANEL
CHOL/HDL RATIO: 4
CHOLESTEROL: 174 mg/dL (ref 0–200)
HDL: 45.3 mg/dL (ref >39.00)
LDL CALC: 90 mg/dL (ref 0–99)
NONHDL: 128.57
Triglycerides: 192 mg/dL — ABNORMAL HIGH (ref 0.0–149.0)
VLDL: 38.4 mg/dL (ref 0.0–40.0)

## 2015-02-15 LAB — HEMOGLOBIN A1C: Hgb A1c MFr Bld: 7.7 % — ABNORMAL HIGH (ref 4.6–6.5)

## 2015-02-15 MED ORDER — SIMVASTATIN 40 MG PO TABS: ORAL_TABLET | ORAL | Status: DC

## 2015-02-15 NOTE — Assessment & Plan Note (Signed)
BP Readings from Last 3 Encounters:  02/15/15 150/88  12/15/14 121/81  12/01/14 151/90   BP slightly elevated today, however he did not take medication. Generally, BP has been well controlled. Renal function with labs. Continue Lisinopril.

## 2015-02-15 NOTE — Progress Notes (Signed)
Subjective:    Patient ID: Dakota Gonzalez, male    DOB: [birth year], 70 y.o.   MRN: [id number]  HPI  70YO male presents for follow up.  DM - BG have "up and down." Rarely above 200. No lows. Compliant with medication.  CAD - CT from [facility] showed 3v CAD. Denies chest pain, dyspnea. However, completely sedentary.  HTN - BP typically 130/60s. Did not take medications this morning.  Back pain has been gradually getting better. Wearing a copper brace and thinks this helps. Not taking any medication for pain except for occasional Flexeril, about 1 time per week.  Wt Readings from Last 3 Encounters:  02/15/15 248 lb 6 oz (112.662 kg)  12/15/14 243 lb 8 oz (110.451 kg)  12/01/14 247 lb 2 oz (112.095 kg)   BP Readings from Last 3 Encounters:  02/15/15 150/88  12/15/14 121/81  12/01/14 151/90    Past Medical History  Diagnosis Date  . Asthma   . Arthritis   . Depression   . Headache(784.0)   . GERD (gastroesophageal reflux disease)   . Allergy   . Hypertension   . Hyperlipidemia   . Migraine   . Diabetes mellitus 2007  . Enlarged prostate   . Sinusitis    Family History  Problem Relation Age of Onset  . Stroke Mother   . Kidney disease Mother   . Diabetes Mother   . Cancer Father   . Stroke Father   . Kidney disease Father   . Cancer Other     colon, breast   Past Surgical History  Procedure Laterality Date  . Back surgery  10/2003    ruptured disc, L4-5, [facility]  . Tonsillectomy and adenoidectomy  1960  . Prostate biopsy  2014    Dr. Jacqlyn Larsen  . Hemangioma excision  2014    right upper arm, Dr. Ronalee Belts   Social History   Social History  . Marital Status: Widowed    Spouse Name: N/A  . Number of Children: N/A  . Years of Education: N/A   Social History Main Topics  . Smoking status: Former Research scientist (life sciences)  . Smokeless tobacco: Never Used  . Alcohol Use: No  . Drug Use: No  . Sexual Activity: Not Asked   Other Topics Concern  . None   Social  History Narrative   Lives in [place]. 1 son lives in [place].      Work - Disabled, previously Museum/gallery conservator and railroad.      Diet - regular   Exercise - ellipitical machine at home    Review of Systems  Constitutional: Negative for fever, chills, activity change, appetite change, fatigue and unexpected weight change.  Eyes: Negative for visual disturbance.  Respiratory: Negative for cough and shortness of breath.   Cardiovascular: Negative for chest pain, palpitations and leg swelling.  Gastrointestinal: Negative for nausea, vomiting, abdominal pain, diarrhea, constipation and abdominal distention.  Genitourinary: Negative for dysuria, urgency and difficulty urinating.  Musculoskeletal: Negative for arthralgias, gait problem and neck pain.  Skin: Negative for color change and rash.  Hematological: Negative for adenopathy.  Psychiatric/Behavioral: Negative for sleep disturbance and dysphoric mood. The patient is not nervous/anxious.        Objective:    BP 150/88 mmHg  Pulse 105  Temp(Src) 97.9 F (36.6 C) (Oral)  Ht 5\' 9"  (1.753 m)  Wt 248 lb 6 oz (112.662 kg)  BMI 36.66 kg/m2  SpO2 96% Physical Exam  Constitutional: He is  oriented to person, place, and time. He appears well-developed and well-nourished. No distress.  HENT:  Head: Normocephalic and atraumatic.  Right Ear: External ear normal.  Left Ear: External ear normal.  Nose: Nose normal.  Mouth/Throat: Oropharynx is clear and moist. No oropharyngeal exudate.  Eyes: Conjunctivae and EOM are normal. Pupils are equal, round, and reactive to light. Right eye exhibits no discharge. Left eye exhibits no discharge. No scleral icterus.  Neck: Normal range of motion. Neck supple. No tracheal deviation present. No thyromegaly present.  Cardiovascular: Normal rate, regular rhythm and normal heart sounds.  Exam reveals no gallop and no friction rub.   No murmur heard. Pulmonary/Chest: Effort normal and breath  sounds normal. No accessory muscle usage. No tachypnea. No respiratory distress. He has no decreased breath sounds. He has no wheezes. He has no rhonchi. He has no rales. He exhibits no tenderness.  Musculoskeletal: Normal range of motion. He exhibits no edema.  Lymphadenopathy:    He has no cervical adenopathy.  Neurological: He is alert and oriented to person, place, and time. No cranial nerve deficit. Coordination normal.  Skin: Skin is warm and dry. No rash noted. He is not diaphoretic. No erythema. No pallor.  Psychiatric: He has a normal mood and affect. His behavior is normal. Judgment and thought content normal.          Assessment & Plan:   Problem List Items Addressed This Visit      Unprioritized   Chronic back pain    Pian symptoms gradually improving. Encouraged him to increase physical activity. Continue prn Flexeril. Follow up prn      Coronary atherosclerosis of native coronary artery - Primary    Reviewed CT chest from [facility] which showed extensive CAD including left main disease. Encouraged him to keep evaluation with cardiology. Question if cardiac cath might be warranted. He has no symptoms of chest pain but is almost completely sedentary. Follow up after cardiology evaluation.      Relevant Medications   simvastatin (ZOCOR) 40 MG tablet   Diabetes mellitus type 2, controlled ([place])    Lab Results  Component Value Date   HGBA1C 7.2* 11/09/2014   BG well controlled by report. Repeat A1c with labs today.      Relevant Medications   simvastatin (ZOCOR) 40 MG tablet   HTN (hypertension)    BP Readings from Last 3 Encounters:  02/15/15 150/88  12/15/14 121/81  12/01/14 151/90   BP slightly elevated today, however he did not take medication. Generally, BP has been well controlled. Renal function with labs. Continue Lisinopril.      Relevant Medications   simvastatin (ZOCOR) 40 MG tablet       Return in about 3 months (around 05/15/2015) for Recheck of  Diabetes.

## 2015-02-15 NOTE — Assessment & Plan Note (Signed)
Reviewed CT chest from [facility] which showed extensive CAD including left main disease. Encouraged him to keep evaluation with cardiology. Question if cardiac cath might be warranted. He has no symptoms of chest pain but is almost completely sedentary. Follow up after cardiology evaluation.

## 2015-02-15 NOTE — Assessment & Plan Note (Signed)
Lab Results  Component Value Date   HGBA1C 7.2* 11/09/2014   BG well controlled by report. Repeat A1c with labs today.

## 2015-02-15 NOTE — Assessment & Plan Note (Signed)
Pian symptoms gradually improving. Encouraged him to increase physical activity. Continue prn Flexeril. Follow up prn

## 2015-02-15 NOTE — Patient Instructions (Signed)
Labs today.   Follow up in 3 months.  

## 2015-02-25 ENCOUNTER — Other Ambulatory Visit: Payer: Self-pay | Admitting: Internal Medicine

## 2015-02-28 ENCOUNTER — Encounter: Payer: Self-pay | Admitting: Cardiovascular Disease

## 2015-02-28 ENCOUNTER — Ambulatory Visit (INDEPENDENT_AMBULATORY_CARE_PROVIDER_SITE_OTHER): Payer: Medicare Other | Admitting: Cardiovascular Disease

## 2015-02-28 VITALS — BP 140/78 | HR 110 | Ht 70.0 in | Wt 252.5 lb

## 2015-02-28 DIAGNOSIS — I2510 Atherosclerotic heart disease of native coronary artery without angina pectoris: Secondary | ICD-10-CM

## 2015-02-28 DIAGNOSIS — R0602 Shortness of breath: Secondary | ICD-10-CM | POA: Diagnosis not present

## 2015-02-28 DIAGNOSIS — E785 Hyperlipidemia, unspecified: Secondary | ICD-10-CM | POA: Diagnosis not present

## 2015-02-28 DIAGNOSIS — I10 Essential (primary) hypertension: Secondary | ICD-10-CM

## 2015-02-28 NOTE — Assessment & Plan Note (Signed)
Lab Results  Component Value Date   CHOL 174 02/15/2015   HDL 45.30 02/15/2015   LDLCALC 90 02/15/2015        TRIG 192.0* 02/15/2015   CHOLHDL 4 02/15/2015   Given that he is diabetic and recent CT scan showed coronary atherosclerosis, I recommend a target LDL of less than 70.

## 2015-02-28 NOTE — Patient Instructions (Addendum)
Medication Instructions:  Your physician recommends that you continue on your current medications as directed. Please refer to the Current Medication list given to you today.   Labwork: none  Testing/Procedures: Your physician has requested that you have a lexiscan myoview. For further information please visit [email or web address]. Please follow instruction sheet, as given.  Dakota Gonzalez  Your caregiver has ordered a Stress Test with nuclear imaging. The purpose of this test is to evaluate the blood supply to your heart muscle. This procedure is referred to as a "Non-Invasive Stress Test." This is because other than having an IV started in your vein, nothing is inserted or "invades" your body. Cardiac stress tests are done to find areas of poor blood flow to the heart by determining the extent of coronary artery disease (CAD). Some patients exercise on a treadmill, which naturally increases the blood flow to your heart, while others who are  unable to walk on a treadmill due to physical limitations have a pharmacologic/chemical stress agent called Lexiscan . This medicine will mimic walking on a treadmill by temporarily increasing your coronary blood flow.   Please note: these test may take anywhere between 2-4 hours to complete  PLEASE REPORT TO [place] AT THE FIRST DESK WILL DIRECT YOU WHERE TO GO  Date of Procedure: Tuesday, Feb 21  Arrival Time for Procedure:___8:15am____________________  Instructions regarding medication:   __xx__ : Hold diabetes medication morning of procedure    PLEASE NOTIFY THE OFFICE AT LEAST 24 HOURS IN ADVANCE IF YOU ARE UNABLE TO KEEP YOUR APPOINTMENT.  [phone] AND  PLEASE NOTIFY NUCLEAR MEDICINE AT [facility] AT LEAST 24 HOURS IN ADVANCE IF YOU ARE UNABLE TO KEEP YOUR APPOINTMENT. [phone]  How to prepare for your Myoview test:   Do not eat or drink after midnight  No caffeine for 24 hours prior to test  No  smoking 24 hours prior to test.  Your medication may be taken with water.  If your doctor stopped a medication because of this test, do not take that medication.  Ladies, please do not wear dresses.  Skirts or pants are appropriate. Please wear a short sleeve shirt.  No perfume, cologne or lotion.  Wear comfortable walking shoes. No heels!            Follow-Up: Your physician recommends that you schedule a follow-up appointment as needed.    Any Other Special Instructions Will Be Listed Below (If Applicable).     If you need a refill on your cardiac medications before your next appointment, please call your pharmacy.  Cardiac Nuclear Scanning A cardiac nuclear scan is used to check your heart for problems, such as the following:  A portion of the heart is not getting enough blood.  Part of the heart muscle has died, which happens with a heart attack.  The heart wall is not working normally.  In this test, a radioactive dye (tracer) is injected into your bloodstream. After the tracer has traveled to your heart, a scanning device is used to measure how much of the tracer is absorbed by or distributed to various areas of your heart. LET [facility] CARE PROVIDER KNOW ABOUT:  Any allergies you have.  All medicines you are taking, including vitamins, herbs, eye drops, creams, and over-the-counter medicines.  Previous problems you or members of your family have had with the use of anesthetics.  Any blood disorders you have.  Previous surgeries you have had.  Medical conditions you  have.  RISKS AND COMPLICATIONS Generally, this is a safe procedure. However, as with any procedure, problems can occur. Possible problems include:   Serious chest pain.  Rapid heartbeat.  Sensation of warmth in your chest. This usually passes quickly. BEFORE THE PROCEDURE Ask your health care provider about changing or stopping your regular medicines. PROCEDURE This procedure is  usually done at a hospital and takes 2-4 hours.  An IV tube is inserted into one of your veins.  Your health care provider will inject a small amount of radioactive tracer through the tube.  You will then wait for 20-40 minutes while the tracer travels through your bloodstream.  You will lie down on an exam table so images of your heart can be taken. Images will be taken for about 15-20 minutes.  You will exercise on a treadmill or stationary bike. While you exercise, your heart activity will be monitored with an electrocardiogram (ECG), and your blood pressure will be checked.  If you are unable to exercise, you may be given a medicine to make your heart beat faster.  When blood flow to your heart has peaked, tracer will again be injected through the IV tube.  After 20-40 minutes, you will get back on the exam table and have more images taken of your heart.  When the procedure is over, your IV tube will be removed. AFTER THE PROCEDURE  You will likely be able to leave shortly after the test. Unless your health care provider tells you otherwise, you may return to your normal schedule, including diet, activities, and medicines.  Make sure you find out how and when you will get your test results.   This information is not intended to replace advice given to you by your health care provider. Make sure you discuss any questions you have with your health care provider.   Document Released: 01/27/2004 Document Revised: 01/06/2013 Document Reviewed: 12/10/2012 Elsevier Interactive Patient Education [employer].

## 2015-02-28 NOTE — Progress Notes (Signed)
Primary care physician: Dr. Gilford Rile  HPI  This is a pleasant 70 year old male who was referred by Dr. Gilford Rile for evaluation of coronary calcifications noted on CT scan. He has no previous cardiac history. He has known history of type 2 diabetes since 2007, hypertension, hyperlipidemia and obesity. He is not a smoker and has no family history of coronary artery disease. He suffers from chronic back pain which worsened recently. He underwent CT scan at [facility] which incidentally showed three-vessel and left main calcifications. He denies any chest pain or shortness of breath. No orthopnea, PND or leg edema. He does not exercise on a regular basis and overall he admits that his lifestyle has been sedentary.  Allergies  Allergen Reactions  . Aspartame Other (See Comments)     Current Outpatient Prescriptions on File Prior to Visit  Medication Sig Dispense Refill  . aspirin 81 MG tablet Take 81 mg by mouth daily.    . Blood Glucose Monitoring Suppl (ONE TOUCH ULTRA SYSTEM KIT) W/DEVICE KIT 1 kit by Does not apply route once. 1 each 0  . Coenzyme Q10 (COQ-10) 100 MG CAPS Take 1 tablet by mouth daily.    . cyclobenzaprine (FLEXERIL) 10 MG tablet Take 1 tablet (10 mg total) by mouth every 8 (eight) hours as needed for muscle spasms (do not drive while using, may make you drowsy). 60 tablet 1  . finasteride (PROSCAR) 5 MG tablet Take 5 mg by mouth daily.    Marland Kitchen glimepiride (AMARYL) 4 MG tablet TAKE 1 TABLET BY MOUTH EVERY DAY 90 tablet 1  . glucose blood test strip One Touch Ultra Blue Test Strips- use one twice daily as needed to check blood glucose level. 50 each 11  . lisinopril (PRINIVIL,ZESTRIL) 40 MG tablet TAKE 1 TABLET BY MOUTH EVERY DAY 90 tablet 0  . Multiple Vitamins-Minerals (CENTRUM SILVER PO) Take 1 tablet by mouth daily.    . ONE TOUCH LANCETS MISC Use as directed. One Touch Delica. Dx: 250.00 100 each 5  . rOPINIRole (REQUIP) 0.5 MG tablet Take 1 tablet (0.5 mg total) by mouth at bedtime.  90 tablet 1  . S-Adenosylmethionine (SAM-E COMPLETE PO) Take by mouth.    . simvastatin (ZOCOR) 40 MG tablet TAKE 1 TABLET BY MOUTH EVERY DAY IN EVENING 90 tablet 3  . STOOL SOFTENER 100 MG capsule   0  . tamsulosin (FLOMAX) 0.4 MG CAPS Take 0.4 mg by mouth daily.     No current facility-administered medications on file prior to visit.     Past Medical History  Diagnosis Date  . Asthma   . Arthritis   . Depression   . Headache(784.0)   . GERD (gastroesophageal reflux disease)   . Allergy   . Hypertension   . Hyperlipidemia   . Migraine   . Diabetes mellitus 2007  . Enlarged prostate   . Sinusitis      Past Surgical History  Procedure Laterality Date  . Back surgery  10/2003    ruptured disc, L4-5, [facility]  . Tonsillectomy and adenoidectomy  1960  . Prostate biopsy  2014    Dr. Jacqlyn Larsen  . Hemangioma excision  2014    right upper arm, Dr. Ronalee Belts     Family History  Problem Relation Age of Onset  . Stroke Mother   . Kidney disease Mother   . Diabetes Mother   . Cancer Father   . Stroke Father   . Kidney disease Father   . Cancer Other  colon, breast     Social History   Social History  . Marital Status: Widowed    Spouse Name: N/A  . Number of Children: N/A  . Years of Education: N/A   Occupational History  . Not on file.   Social History Main Topics  . Smoking status: Former Research scientist (life sciences)  . Smokeless tobacco: Never Used  . Alcohol Use: No  . Drug Use: No  . Sexual Activity: Not on file   Other Topics Concern  . Not on file   Social History Narrative   Lives in [place]. 1 son lives in [place].      Work - Disabled, previously Museum/gallery conservator and railroad.      Diet - regular   Exercise - ellipitical machine at home     ROS A 10 point review of system was performed. It is negative other than that mentioned in the history of present illness.   PHYSICAL EXAM   BP 140/78 mmHg  Pulse 110  Ht '5\' 10"'  (1.778 m)  Wt 252  lb 8 oz (114.533 kg)  BMI 36.23 kg/m2 Constitutional: He is oriented to person, place, and time. He appears well-developed and well-nourished. No distress.  HENT: No nasal discharge.  Head: Normocephalic and atraumatic.  Eyes: Pupils are equal and round.  No discharge. Neck: Normal range of motion. Neck supple. No JVD present. No thyromegaly present.  Cardiovascular: Normal rate, regular rhythm, normal heart sounds. Exam reveals no gallop and no friction rub. No murmur heard.  Pulmonary/Chest: Effort normal and breath sounds normal. No stridor. No respiratory distress. He has no wheezes. He has no rales. He exhibits no tenderness.  Abdominal: Soft. Bowel sounds are normal. He exhibits no distension. There is no tenderness. There is no rebound and no guarding.  Musculoskeletal: Normal range of motion. He exhibits no edema and no tenderness.  Neurological: He is alert and oriented to person, place, and time. Coordination normal.  Skin: Skin is warm and dry. No rash noted. He is not diaphoretic. No erythema. No pallor.  Psychiatric: He has a normal mood and affect. His behavior is normal. Judgment and thought content normal.       EKG: Sinus tachycardia with left axis deviation.   ASSESSMENT AND PLAN

## 2015-02-28 NOTE — Assessment & Plan Note (Signed)
Blood pressure is controlled on current medications. 

## 2015-02-28 NOTE — Assessment & Plan Note (Signed)
The patient has no convincing anginal symptoms but overall he is not very active. He does complain of dyspnea. He overexerts himself but denies any chest pain. Recent CT scan establishes the diagnosis of coronary atherosclerosis. I think we have to determine if he has obstructive coronary artery disease or not. Thus, I requested a pharmacologic nuclear stress test for evaluation. He is not able to exercise on a treadmill.  I discussed with the patient the importance of lifestyle changes in order to decrease the chance of future coronary artery disease and cardiovascular events. We discussed the importance of controlling risk factors, healthy diet as well as regular exercise. Continue low-dose aspirin indefinitely. If stress test comes back negative, I advised him to start an exercise program.

## 2015-03-07 ENCOUNTER — Telehealth: Payer: Self-pay

## 2015-03-07 NOTE — Telephone Encounter (Signed)
Reviewed lexi myoview instructions w/pt. Pt verbalized understanding with no questions.  

## 2015-03-08 ENCOUNTER — Encounter
Admission: RE | Admit: 2015-03-08 | Discharge: 2015-03-08 | Disposition: A | Discharge status: Home / Self Care | Payer: Medicare Other | Source: Ambulatory Visit | Attending: Cardiovascular Disease | Admitting: Cardiovascular Disease

## 2015-03-08 DIAGNOSIS — R0602 Shortness of breath: Secondary | ICD-10-CM | POA: Diagnosis not present

## 2015-03-08 MED ORDER — TECHNETIUM TC 99M SESTAMIBI - CARDIOLITE
13.4300 | Freq: Once | INTRAVENOUS | Status: AC | PRN
Start: 2015-03-08 — End: 2015-03-08
  Administered 2015-03-08: 08:00:00 13.43 via INTRAVENOUS

## 2015-03-08 MED ORDER — TECHNETIUM TC 99M SESTAMIBI - CARDIOLITE
30.0000 | Freq: Once | INTRAVENOUS | Status: AC | PRN
  Administered 2015-03-08: 09:00:00 30.7 via INTRAVENOUS

## 2015-03-08 MED ORDER — REGADENOSON 0.4 MG/5ML IV SOLN
0.4000 mg | Freq: Once | INTRAVENOUS | Status: AC
  Administered 2015-03-08: 0.4 mg via INTRAVENOUS

## 2015-03-09 LAB — NM MYOCAR MULTI W/SPECT W/WALL MOTION / EF
CHL CUP MPHR: 152 {beats}/min
CHL CUP NUCLEAR SDS: 1
CHL CUP NUCLEAR SRS: 16
CHL CUP STRESS STAGE 2 HR: 90 {beats}/min
CHL CUP STRESS STAGE 3 GRADE: 0 %
CHL CUP STRESS STAGE 3 HR: 90 {beats}/min
CHL CUP STRESS STAGE 3 SPEED: 0 mph
CHL CUP STRESS STAGE 4 GRADE: 0 %
CHL CUP STRESS STAGE 4 HR: 114 {beats}/min
CHL CUP STRESS STAGE 6 DBP: 80 mmHg
CHL CUP STRESS STAGE 6 GRADE: 0 %
CHL CUP STRESS STAGE 6 HR: 108 {beats}/min
CHL CUP STRESS STAGE 6 SBP: 153 mmHg
CHL CUP STRESS STAGE 6 SPEED: 0 mph
CSEPHR: 76 %
CSEPPMHR: 75 %
Estimated workload: 1 METS
Exercise duration (min): 0 min
Exercise duration (sec): 0 s
LV sys vol: 23 mL
LVDIAVOL: 84 mL
Peak HR: 114 {beats}/min
Rest HR: 97 {beats}/min
SSS: 1
Stage 1 HR: 88 {beats}/min
Stage 2 Grade: 0 %
Stage 2 Speed: 0 mph
Stage 4 Speed: 0 mph
Stage 5 Grade: 0 %
Stage 5 HR: 116 {beats}/min
Stage 5 Speed: 0 mph
TID: 0.9

## 2015-03-10 ENCOUNTER — Telehealth: Payer: Self-pay

## 2015-03-10 NOTE — Telephone Encounter (Signed)
Pt would like stress test results.  

## 2015-03-11 NOTE — Telephone Encounter (Signed)
Left message on pt home VM. Awaiting MD review of lexi myoview results. Left CB number w/questions.

## 2015-03-21 ENCOUNTER — Other Ambulatory Visit: Payer: Self-pay | Admitting: Internal Medicine

## 2015-03-21 ENCOUNTER — Encounter: Payer: Self-pay | Admitting: Internal Medicine

## 2015-03-21 MED ORDER — TAMSULOSIN HCL 0.4 MG PO CAPS: 0.4000 mg | ORAL_CAPSULE | Freq: Every day | ORAL | Status: DC

## 2015-03-21 NOTE — Telephone Encounter (Signed)
This is a historical medication. Please advise?

## 2015-03-21 NOTE — Telephone Encounter (Signed)
**  Patient requests 90 days supply**

## 2015-04-25 ENCOUNTER — Other Ambulatory Visit: Payer: Self-pay | Admitting: Internal Medicine

## 2015-04-26 NOTE — Telephone Encounter (Signed)
Rx refill sent to pharmacy. 

## 2015-05-02 ENCOUNTER — Other Ambulatory Visit: Payer: Self-pay | Admitting: Internal Medicine

## 2015-05-17 ENCOUNTER — Encounter: Payer: Self-pay | Admitting: Internal Medicine

## 2015-05-17 ENCOUNTER — Ambulatory Visit (INDEPENDENT_AMBULATORY_CARE_PROVIDER_SITE_OTHER): Payer: Medicare Other | Admitting: Internal Medicine

## 2015-05-17 VITALS — BP 148/80 | HR 92 | Wt 249.8 lb

## 2015-05-17 DIAGNOSIS — I10 Essential (primary) hypertension: Secondary | ICD-10-CM | POA: Diagnosis not present

## 2015-05-17 DIAGNOSIS — E1159 Type 2 diabetes mellitus with other circulatory complications: Secondary | ICD-10-CM | POA: Diagnosis not present

## 2015-05-17 DIAGNOSIS — R972 Elevated prostate specific antigen [PSA]: Secondary | ICD-10-CM | POA: Diagnosis not present

## 2015-05-17 LAB — COMPREHENSIVE METABOLIC PANEL
ALT: 37 U/L (ref 0–53)
AST: 31 U/L (ref 0–37)
Albumin: 4.8 g/dL (ref 3.5–5.2)
Alkaline Phosphatase: 50 U/L (ref 39–117)
BUN: 10 mg/dL (ref 6–23)
CHLORIDE: 104 meq/L (ref 96–112)
CO2: 26 meq/L (ref 19–32)
CREATININE: 0.94 mg/dL (ref 0.40–1.50)
Calcium: 10.2 mg/dL (ref 8.4–10.5)
GFR: 84.6 mL/min (ref >60.00)
Glucose, Bld: 152 mg/dL — ABNORMAL HIGH (ref 70–99)
Potassium: 4.3 mEq/L (ref 3.5–5.1)
SODIUM: 138 meq/L (ref 135–145)
Total Bilirubin: 1.3 mg/dL — ABNORMAL HIGH (ref 0.2–1.2)
Total Protein: 7.4 g/dL (ref 6.0–8.3)

## 2015-05-17 LAB — HEMOGLOBIN A1C: Hgb A1c MFr Bld: 8.3 % — ABNORMAL HIGH (ref 4.6–6.5)

## 2015-05-17 MED ORDER — TAMSULOSIN HCL 0.4 MG PO CAPS: 0.4000 mg | ORAL_CAPSULE | Freq: Every day | ORAL | Status: DC

## 2015-05-17 NOTE — Assessment & Plan Note (Signed)
BP Readings from Last 3 Encounters:  05/17/15 148/80  02/28/15 140/78  02/15/15 150/88   BP slightly elevated today. Will check renal function with labs. Continue Lisinopril.

## 2015-05-17 NOTE — Progress Notes (Signed)
Subjective:    Patient ID: Dakota Gonzalez, male    DOB: 09/23/1945, 70 y.o.   MRN: [id number]  HPI  70YO male presents for follow up.  DM - BG variable. Highest 212. Compliant with medication.  HTN - Does not generally check BP at home. No CP, HA, palpitations. Compliant with medication.  No new concerns. Back pain has improved. Increasing walking. Recently went with church group to [facility].   Wt Readings from Last 3 Encounters:  05/17/15 249 lb 12.8 oz (113.309 kg)  02/28/15 252 lb 8 oz (114.533 kg)  02/15/15 248 lb 6 oz (112.662 kg)   BP Readings from Last 3 Encounters:  05/17/15 148/80  02/28/15 140/78  02/15/15 150/88    Past Medical History  Diagnosis Date  . Asthma   . Arthritis   . Depression   . Headache(784.0)   . GERD (gastroesophageal reflux disease)   . Allergy   . Hypertension   . Hyperlipidemia   . Migraine   . Diabetes mellitus 2007  . Enlarged prostate   . Sinusitis    Family History  Problem Relation Age of Onset  . Stroke Mother   . Kidney disease Mother   . Diabetes Mother   . Cancer Father   . Stroke Father   . Kidney disease Father   . Cancer Other     colon, breast   Past Surgical History  Procedure Laterality Date  . Back surgery  10/2003    ruptured disc, L4-5, [facility]  . Tonsillectomy and adenoidectomy  1960  . Prostate biopsy  2014    Dr. Jacqlyn Larsen  . Hemangioma excision  2014    right upper arm, Dr. Ronalee Belts   Social History   Social History  . Marital Status: Widowed    Spouse Name: N/A  . Number of Children: N/A  . Years of Education: N/A   Social History Main Topics  . Smoking status: Former Research scientist (life sciences)  . Smokeless tobacco: Never Used  . Alcohol Use: No  . Drug Use: No  . Sexual Activity: Not Asked   Other Topics Concern  . None   Social History Narrative   Lives in [place]. 1 son lives in [place].      Work - Disabled, previously Museum/gallery conservator and railroad.      Diet - regular   Exercise - ellipitical machine at home    Review of Systems  Constitutional: Negative for fever, chills, activity change, appetite change, fatigue and unexpected weight change.  Eyes: Negative for visual disturbance.  Respiratory: Negative for cough and shortness of breath.   Cardiovascular: Negative for chest pain, palpitations and leg swelling.  Gastrointestinal: Negative for nausea, vomiting, abdominal pain, diarrhea, constipation and abdominal distention.  Genitourinary: Negative for dysuria, urgency and difficulty urinating.  Musculoskeletal: Negative for myalgias, back pain, arthralgias and gait problem.  Skin: Negative for color change and rash.  Neurological: Negative for weakness, light-headedness and headaches.  Hematological: Negative for adenopathy.  Psychiatric/Behavioral: Negative for suicidal ideas, sleep disturbance and dysphoric mood. The patient is not nervous/anxious.        Objective:    BP 148/80 mmHg  Pulse 92  Wt 249 lb 12.8 oz (113.309 kg)  SpO2 95% Physical Exam  Constitutional: He is oriented to person, place, and time. He appears well-developed and well-nourished. No distress.  HENT:  Head: Normocephalic and atraumatic.  Right Ear: External ear normal.  Left Ear: External ear normal.  Nose: Nose normal.  Mouth/Throat: Oropharynx is clear and moist. No oropharyngeal exudate.  Eyes: Conjunctivae and EOM are normal. Pupils are equal, round, and reactive to light. Right eye exhibits no discharge. Left eye exhibits no discharge. No scleral icterus.  Neck: Normal range of motion. Neck supple. No tracheal deviation present. No thyromegaly present.  Cardiovascular: Normal rate, regular rhythm and normal heart sounds.  Exam reveals no gallop and no friction rub.   No murmur heard. Pulmonary/Chest: Effort normal and breath sounds normal. No accessory muscle usage. No tachypnea. No respiratory distress. He has no decreased breath sounds. He has no wheezes. He has  no rhonchi. He has no rales. He exhibits no tenderness.  Musculoskeletal: Normal range of motion. He exhibits no edema.  Lymphadenopathy:    He has no cervical adenopathy.  Neurological: He is alert and oriented to person, place, and time. No cranial nerve deficit. Coordination normal.  Skin: Skin is warm and dry. No rash noted. He is not diaphoretic. No erythema. No pallor.  Psychiatric: He has a normal mood and affect. His behavior is normal. Judgment and thought content normal.          Assessment & Plan:   Problem List Items Addressed This Visit      Unprioritized   Diabetes mellitus type 2, controlled ([place]) - Primary    BG variable by report. Will check A1c with labs. Continue Glimepiride.      Relevant Orders   Comprehensive metabolic panel   Hemoglobin A1c   Elevated PSA    Followed by urology. Will check PSA with labs today.      Relevant Orders   PSA, total and free   HTN (hypertension)    BP Readings from Last 3 Encounters:  05/17/15 148/80  02/28/15 140/78  02/15/15 150/88   BP slightly elevated today. Will check renal function with labs. Continue Lisinopril.          Return in about 3 months (around 08/17/2015) for Recheck of Diabetes.  Ronette Deter, MD Internal Medicine [facility]

## 2015-05-17 NOTE — Patient Instructions (Signed)
Labs today.   Follow up in 3 months.  

## 2015-05-17 NOTE — Assessment & Plan Note (Signed)
Followed by urology. Will check PSA with labs today.

## 2015-05-17 NOTE — Assessment & Plan Note (Signed)
BG variable by report. Will check A1c with labs. Continue Glimepiride.

## 2015-05-17 NOTE — Progress Notes (Signed)
Pre visit review using our clinic review tool, if applicable. No additional management support is needed unless otherwise documented below in the visit note. 

## 2015-05-18 LAB — PSA, TOTAL AND FREE
PSA FREE PCT: 36 % (ref >25)
PSA FREE: 0.52 ng/mL
PSA: 1.45 ng/mL (ref <=4.00)

## 2015-05-22 ENCOUNTER — Other Ambulatory Visit: Payer: Self-pay | Admitting: Internal Medicine

## 2015-05-31 ENCOUNTER — Encounter: Payer: Self-pay | Admitting: Internal Medicine

## 2015-06-15 DIAGNOSIS — E860 Dehydration: Secondary | ICD-10-CM | POA: Diagnosis not present

## 2015-06-15 DIAGNOSIS — N400 Enlarged prostate without lower urinary tract symptoms: Secondary | ICD-10-CM | POA: Diagnosis not present

## 2015-06-15 DIAGNOSIS — I252 Old myocardial infarction: Secondary | ICD-10-CM | POA: Diagnosis not present

## 2015-06-15 DIAGNOSIS — E7800 Pure hypercholesterolemia, unspecified: Secondary | ICD-10-CM | POA: Diagnosis not present

## 2015-06-15 DIAGNOSIS — E119 Type 2 diabetes mellitus without complications: Secondary | ICD-10-CM | POA: Diagnosis not present

## 2015-06-15 DIAGNOSIS — I10 Essential (primary) hypertension: Secondary | ICD-10-CM | POA: Diagnosis not present

## 2015-06-15 DIAGNOSIS — Z87891 Personal history of nicotine dependence: Secondary | ICD-10-CM | POA: Diagnosis not present

## 2015-06-15 DIAGNOSIS — R000 Tachycardia, unspecified: Secondary | ICD-10-CM | POA: Diagnosis not present

## 2015-06-15 DIAGNOSIS — R898 Other abnormal findings in specimens from other organs, systems and tissues: Secondary | ICD-10-CM | POA: Diagnosis not present

## 2015-06-15 DIAGNOSIS — I7090 Unspecified atherosclerosis: Secondary | ICD-10-CM | POA: Diagnosis not present

## 2015-06-15 DIAGNOSIS — R002 Palpitations: Secondary | ICD-10-CM | POA: Diagnosis not present

## 2015-06-16 ENCOUNTER — Encounter: Payer: Self-pay | Admitting: Internal Medicine

## 2015-06-16 DIAGNOSIS — N411 Chronic prostatitis: Secondary | ICD-10-CM | POA: Diagnosis not present

## 2015-06-16 DIAGNOSIS — N403 Nodular prostate with lower urinary tract symptoms: Secondary | ICD-10-CM | POA: Diagnosis not present

## 2015-06-16 DIAGNOSIS — R972 Elevated prostate specific antigen [PSA]: Secondary | ICD-10-CM | POA: Diagnosis not present

## 2015-06-16 DIAGNOSIS — N138 Other obstructive and reflux uropathy: Secondary | ICD-10-CM | POA: Diagnosis not present

## 2015-06-16 DIAGNOSIS — R339 Retention of urine, unspecified: Secondary | ICD-10-CM | POA: Diagnosis not present

## 2015-06-20 ENCOUNTER — Other Ambulatory Visit: Payer: Self-pay | Admitting: Internal Medicine

## 2015-06-22 ENCOUNTER — Encounter: Payer: Self-pay | Admitting: Internal Medicine

## 2015-06-22 MED ORDER — GLIMEPIRIDE 4 MG PO TABS: ORAL_TABLET | ORAL | Status: DC

## 2015-06-24 ENCOUNTER — Ambulatory Visit (INDEPENDENT_AMBULATORY_CARE_PROVIDER_SITE_OTHER): Payer: Medicare Other | Admitting: Internal Medicine

## 2015-06-24 ENCOUNTER — Encounter: Payer: Self-pay | Admitting: Internal Medicine

## 2015-06-24 VITALS — BP 114/76 | HR 81 | Ht 70.0 in | Wt 242.8 lb

## 2015-06-24 DIAGNOSIS — I10 Essential (primary) hypertension: Secondary | ICD-10-CM

## 2015-06-24 DIAGNOSIS — R000 Tachycardia, unspecified: Secondary | ICD-10-CM

## 2015-06-24 DIAGNOSIS — E1159 Type 2 diabetes mellitus with other circulatory complications: Secondary | ICD-10-CM | POA: Diagnosis not present

## 2015-06-24 DIAGNOSIS — E669 Obesity, unspecified: Secondary | ICD-10-CM

## 2015-06-24 NOTE — Assessment & Plan Note (Signed)
BP Readings from Last 3 Encounters:  06/24/15 114/76  05/17/15 148/80  02/28/15 140/78   BP lower today. Discussed possibly decreasing Lisinopril to 20mg  daily if BP continues to run low. He will call or email if BP <100/70

## 2015-06-24 NOTE — Progress Notes (Signed)
Pre visit review using our clinic review tool, if applicable. No additional management support is needed unless otherwise documented below in the visit note. 

## 2015-06-24 NOTE — Assessment & Plan Note (Signed)
Wt Readings from Last 3 Encounters:  06/24/15 242 lb 12.8 oz (110.133 kg)  05/17/15 249 lb 12.8 oz (113.309 kg)  02/28/15 252 lb 8 oz (114.533 kg)   Congratulated pt on weight loss. Encouraged continued effort at healthy diet and exercise.

## 2015-06-24 NOTE — Assessment & Plan Note (Signed)
BG improved. Encouraged continued healthy diet and exercise. Repeat A1c in 08/2015

## 2015-06-24 NOTE — Assessment & Plan Note (Signed)
Recent episode of tachycardia. Seen in ER. Likely secondary to dehydration.  HR normal today. Encouraged him to drink more fluids with exercise.

## 2015-06-24 NOTE — Progress Notes (Signed)
Subjective:    Patient ID: Dakota Gonzalez, male    DOB: 1945-06-05, 70 y.o.   MRN: [id number]  HPI  70YO male presents for follow up.  Recently seen at outside ER for elevated HR. Had been exercising more that week and not drinking much fluid. EKG in ER showed sinus tachycardia. HR improved with 2L of IVF.  Continues to exercise, using treadmill for about 1 mile then does circuit training.  HTN - Not regularly checking BP at home.  DM - BG improved, near 100 fasting. Compliant with medication.   Lab Results  Component Value Date   HGBA1C 8.3* 05/17/2015     Wt Readings from Last 3 Encounters:  06/24/15 242 lb 12.8 oz (110.133 kg)  05/17/15 249 lb 12.8 oz (113.309 kg)  02/28/15 252 lb 8 oz (114.533 kg)   BP Readings from Last 3 Encounters:  06/24/15 114/76  05/17/15 148/80  02/28/15 140/78    Past Medical History  Diagnosis Date  . Asthma   . Arthritis   . Depression   . Headache(784.0)   . GERD (gastroesophageal reflux disease)   . Allergy   . Hypertension   . Hyperlipidemia   . Migraine   . Diabetes mellitus 2007  . Enlarged prostate   . Sinusitis    Family History  Problem Relation Age of Onset  . Stroke Mother   . Kidney disease Mother   . Diabetes Mother   . Cancer Father   . Stroke Father   . Kidney disease Father   . Cancer Other     colon, breast   Past Surgical History  Procedure Laterality Date  . Back surgery  10/2003    ruptured disc, L4-5, [facility]  . Tonsillectomy and adenoidectomy  1960  . Prostate biopsy  2014    Dr. Jacqlyn Larsen  . Hemangioma excision  2014    right upper arm, Dr. Ronalee Belts   Social History   Social History  . Marital Status: Widowed    Spouse Name: N/A  . Number of Children: N/A  . Years of Education: N/A   Social History Main Topics  . Smoking status: Former Research scientist (life sciences)  . Smokeless tobacco: Never Used  . Alcohol Use: No  . Drug Use: No  . Sexual Activity: Not Asked   Other Topics Concern  . None     Social History Narrative   Lives in [place]. 1 son lives in [place].      Work - Disabled, previously Museum/gallery conservator and railroad.      Diet - regular   Exercise - ellipitical machine at home    Review of Systems  Constitutional: Negative for fever, chills, activity change, appetite change, fatigue and unexpected weight change.  Eyes: Negative for visual disturbance.  Respiratory: Negative for cough and shortness of breath.   Cardiovascular: Negative for chest pain, palpitations and leg swelling.  Gastrointestinal: Negative for nausea, vomiting, abdominal pain, diarrhea, constipation and abdominal distention.  Genitourinary: Negative for dysuria, urgency and difficulty urinating.  Musculoskeletal: Negative for arthralgias and gait problem.  Skin: Negative for color change and rash.  Hematological: Negative for adenopathy.  Psychiatric/Behavioral: Negative for suicidal ideas, sleep disturbance and dysphoric mood. The patient is not nervous/anxious.        Objective:    BP 114/76 mmHg  Pulse 81  Ht 5\' 10"  (1.778 m)  Wt 242 lb 12.8 oz (110.133 kg)  BMI 34.84 kg/m2  SpO2 95% Physical Exam  Constitutional: He  is oriented to person, place, and time. He appears well-developed and well-nourished. No distress.  HENT:  Head: Normocephalic and atraumatic.  Right Ear: External ear normal.  Left Ear: External ear normal.  Nose: Nose normal.  Mouth/Throat: Oropharynx is clear and moist. No oropharyngeal exudate.  Eyes: Conjunctivae and EOM are normal. Pupils are equal, round, and reactive to light. Right eye exhibits no discharge. Left eye exhibits no discharge. No scleral icterus.  Neck: Normal range of motion. Neck supple. No tracheal deviation present. No thyromegaly present.  Cardiovascular: Normal rate, regular rhythm and normal heart sounds.  Exam reveals no gallop and no friction rub.   No murmur heard. Pulmonary/Chest: Effort normal and breath sounds normal. No  accessory muscle usage. No tachypnea. No respiratory distress. He has no decreased breath sounds. He has no wheezes. He has no rhonchi. He has no rales. He exhibits no tenderness.  Musculoskeletal: Normal range of motion. He exhibits no edema.  Lymphadenopathy:    He has no cervical adenopathy.  Neurological: He is alert and oriented to person, place, and time. No cranial nerve deficit. Coordination normal.  Skin: Skin is warm and dry. No rash noted. He is not diaphoretic. No erythema. No pallor.  Psychiatric: He has a normal mood and affect. His behavior is normal. Judgment and thought content normal.          Assessment & Plan:   Problem List Items Addressed This Visit      Unprioritized   Diabetes mellitus type 2, controlled ([place]) - Primary    BG improved. Encouraged continued healthy diet and exercise. Repeat A1c in 08/2015      Relevant Orders   Comprehensive metabolic panel   Hemoglobin A1c   Lipid panel   Microalbumin / creatinine urine ratio   HTN (hypertension)    BP Readings from Last 3 Encounters:  06/24/15 114/76  05/17/15 148/80  02/28/15 140/78   BP lower today. Discussed possibly decreasing Lisinopril to 20mg  daily if BP continues to run low. He will call or email if BP <100/70      Obesity (BMI 30-39.9)    Wt Readings from Last 3 Encounters:  06/24/15 242 lb 12.8 oz (110.133 kg)  05/17/15 249 lb 12.8 oz (113.309 kg)  02/28/15 252 lb 8 oz (114.533 kg)   Congratulated pt on weight loss. Encouraged continued effort at healthy diet and exercise.      Tachycardia    Recent episode of tachycardia. Seen in ER. Likely secondary to dehydration.  HR normal today. Encouraged him to drink more fluids with exercise.          Return in about 2 years (around 06/23/2017) for Recheck of Diabetes.  Ronette Deter, MD Internal Medicine [facility]

## 2015-06-24 NOTE — Patient Instructions (Addendum)
Monitor blood pressure. If any lightheadedness or if blood pressure less than 100/70 consistently,  Please call.  Labs prior to next visit.

## 2015-07-12 ENCOUNTER — Encounter: Payer: Self-pay | Admitting: Internal Medicine

## 2015-07-19 ENCOUNTER — Other Ambulatory Visit: Payer: Self-pay | Admitting: Internal Medicine

## 2015-08-15 ENCOUNTER — Other Ambulatory Visit (INDEPENDENT_AMBULATORY_CARE_PROVIDER_SITE_OTHER): Payer: Medicare Other

## 2015-08-15 DIAGNOSIS — E1159 Type 2 diabetes mellitus with other circulatory complications: Secondary | ICD-10-CM

## 2015-08-15 LAB — LIPID PANEL
CHOLESTEROL: 128 mg/dL (ref 0–200)
HDL: 39.1 mg/dL (ref >39.00)
LDL Cholesterol: 54 mg/dL (ref 0–99)
NonHDL: 89.23
TRIGLYCERIDES: 177 mg/dL — AB (ref 0.0–149.0)
Total CHOL/HDL Ratio: 3
VLDL: 35.4 mg/dL (ref 0.0–40.0)

## 2015-08-15 LAB — COMPREHENSIVE METABOLIC PANEL
ALT: 30 U/L (ref 0–53)
AST: 27 U/L (ref 0–37)
Albumin: 4.5 g/dL (ref 3.5–5.2)
Alkaline Phosphatase: 43 U/L (ref 39–117)
BILIRUBIN TOTAL: 1.3 mg/dL — AB (ref 0.2–1.2)
BUN: 10 mg/dL (ref 6–23)
CHLORIDE: 102 meq/L (ref 96–112)
CO2: 27 meq/L (ref 19–32)
Calcium: 10 mg/dL (ref 8.4–10.5)
Creatinine, Ser: 1 mg/dL (ref 0.40–1.50)
GFR: 78.72 mL/min (ref >60.00)
Glucose, Bld: 140 mg/dL — ABNORMAL HIGH (ref 70–99)
Potassium: 4.2 mEq/L (ref 3.5–5.1)
Sodium: 139 mEq/L (ref 135–145)
Total Protein: 7.6 g/dL (ref 6.0–8.3)

## 2015-08-15 LAB — MICROALBUMIN / CREATININE URINE RATIO
Creatinine,U: 60.6 mg/dL
MICROALB/CREAT RATIO: 1.2 mg/g (ref 0.0–30.0)
Microalb, Ur: <0.7 mg/dL (ref 0.0–1.9)

## 2015-08-15 LAB — HEMOGLOBIN A1C: HEMOGLOBIN A1C: 6.8 % — AB (ref 4.6–6.5)

## 2015-08-17 ENCOUNTER — Ambulatory Visit (INDEPENDENT_AMBULATORY_CARE_PROVIDER_SITE_OTHER): Payer: Medicare Other | Admitting: Internal Medicine

## 2015-08-17 ENCOUNTER — Encounter: Payer: Self-pay | Admitting: Internal Medicine

## 2015-08-17 VITALS — BP 132/80 | HR 78 | Ht 70.0 in | Wt 241.0 lb

## 2015-08-17 DIAGNOSIS — E785 Hyperlipidemia, unspecified: Secondary | ICD-10-CM

## 2015-08-17 DIAGNOSIS — E1159 Type 2 diabetes mellitus with other circulatory complications: Secondary | ICD-10-CM | POA: Diagnosis not present

## 2015-08-17 DIAGNOSIS — I10 Essential (primary) hypertension: Secondary | ICD-10-CM | POA: Diagnosis not present

## 2015-08-17 MED ORDER — LISINOPRIL 20 MG PO TABS: 20.0000 mg | ORAL_TABLET | Freq: Every day | ORAL | 3 refills | Status: DC

## 2015-08-17 NOTE — Assessment & Plan Note (Signed)
BP Readings from Last 3 Encounters:  08/17/15 132/80  06/24/15 114/76  05/17/15 (!) 148/80   BP running low at home. Will decrease Lisinopril to 20mg  daily. He will call or email with BP readings. Follow up in 3 months.

## 2015-08-17 NOTE — Progress Notes (Signed)
Pre visit review using our clinic review tool, if applicable. No additional management support is needed unless otherwise documented below in the visit note. 

## 2015-08-17 NOTE — Assessment & Plan Note (Signed)
BG well controlled. Will continue current medication. Follow up in 3 months.

## 2015-08-17 NOTE — Assessment & Plan Note (Signed)
Lipids well controlled on labs. Will continue Simvastatin.

## 2015-08-17 NOTE — Progress Notes (Signed)
Subjective:    Patient ID: Dakota Gonzalez, male    DOB: 04-12-1945, 70 y.o.   MRN: [id number]  HPI 70YO male presents for follow up.   DM - Compliant with medications. Did not bring record of BG today.  Lab Results  Component Value Date   HGBA1C 6.8 (H) 08/15/2015   HTN - BP occasionally low 110/60s. Feels tired at times. Sometimes, holds his lisinopril.   Wt Readings from Last 3 Encounters:  08/17/15 241 lb (109.3 kg)  06/24/15 242 lb 12.8 oz (110.1 kg)  05/17/15 249 lb 12.8 oz (113.3 kg)   BP Readings from Last 3 Encounters:  08/17/15 132/80  06/24/15 114/76  05/17/15 (!) 148/80    Past Medical History:  Diagnosis Date  . Allergy   . Arthritis   . Asthma   . Depression   . Diabetes mellitus 2007  . Enlarged prostate   . GERD (gastroesophageal reflux disease)   . Headache(784.0)   . Hyperlipidemia   . Hypertension   . Migraine   . Sinusitis    Family History  Problem Relation Age of Onset  . Stroke Mother   . Kidney disease Mother   . Diabetes Mother   . Cancer Father   . Stroke Father   . Kidney disease Father   . Cancer Other     colon, breast   Past Surgical History:  Procedure Laterality Date  . BACK SURGERY  10/2003   ruptured disc, L4-5, [facility]  . HEMANGIOMA EXCISION  2014   right upper arm, Dr. Ronalee Belts  . PROSTATE BIOPSY  2014   Dr. Jacqlyn Larsen  . TONSILLECTOMY AND ADENOIDECTOMY  1960   Social History   Social History  . Marital status: Widowed    Spouse name: N/A  . Number of children: N/A  . Years of education: N/A   Social History Main Topics  . Smoking status: Former Research scientist (life sciences)  . Smokeless tobacco: Never Used  . Alcohol use No  . Drug use: No  . Sexual activity: Not Asked   Other Topics Concern  . None   Social History Narrative   Lives in [place]. 1 son lives in [place].      Work - Disabled, previously Museum/gallery conservator and railroad.      Diet - regular   Exercise - ellipitical machine at home     Review of Systems  Constitutional: Positive for fatigue. Negative for activity change, appetite change, chills, fever and unexpected weight change.  Eyes: Negative for visual disturbance.  Respiratory: Negative for cough and shortness of breath.   Cardiovascular: Negative for chest pain, palpitations and leg swelling.  Gastrointestinal: Negative for abdominal distention, abdominal pain, constipation, diarrhea, nausea and vomiting.  Genitourinary: Negative for difficulty urinating, dysuria and urgency.  Musculoskeletal: Negative for arthralgias and gait problem.  Skin: Negative for color change and rash.  Neurological: Negative for weakness.  Hematological: Negative for adenopathy.  Psychiatric/Behavioral: Negative for dysphoric mood and sleep disturbance. The patient is not nervous/anxious.        Objective:    BP 132/80 (BP Location: Left Arm, Patient Position: Sitting, Cuff Size: Large)   Pulse 78   Ht 5\' 10"  (1.778 m)   Wt 241 lb (109.3 kg)   SpO2 96%   BMI 34.58 kg/m  Physical Exam  Constitutional: He is oriented to person, place, and time. He appears well-developed and well-nourished. No distress.  HENT:  Head: Normocephalic and atraumatic.  Right Ear: External ear  normal.  Left Ear: External ear normal.  Nose: Nose normal.  Mouth/Throat: Oropharynx is clear and moist. No oropharyngeal exudate.  Eyes: Conjunctivae and EOM are normal. Pupils are equal, round, and reactive to light. Right eye exhibits no discharge. Left eye exhibits no discharge. No scleral icterus.  Neck: Normal range of motion. Neck supple. No tracheal deviation present. No thyromegaly present.  Cardiovascular: Normal rate, regular rhythm and normal heart sounds.  Exam reveals no gallop and no friction rub.   No murmur heard. Pulmonary/Chest: Effort normal and breath sounds normal. No accessory muscle usage. No tachypnea. No respiratory distress. He has no decreased breath sounds. He has no wheezes. He  has no rhonchi. He has no rales. He exhibits no tenderness.  Musculoskeletal: Normal range of motion. He exhibits no edema.  Lymphadenopathy:    He has no cervical adenopathy.  Neurological: He is alert and oriented to person, place, and time. No cranial nerve deficit. Coordination normal.  Skin: Skin is warm and dry. No rash noted. He is not diaphoretic. No erythema. No pallor.  Psychiatric: He has a normal mood and affect. His behavior is normal. Judgment and thought content normal.          Assessment & Plan:   Problem List Items Addressed This Visit      Unprioritized   Diabetes mellitus type 2, controlled ([place]) - Primary (Chronic)    BG well controlled. Will continue current medication. Follow up in 3 months.      Relevant Medications   lisinopril (PRINIVIL,ZESTRIL) 20 MG tablet   HTN (hypertension) (Chronic)    BP Readings from Last 3 Encounters:  08/17/15 132/80  06/24/15 114/76  05/17/15 (!) 148/80   BP running low at home. Will decrease Lisinopril to 20mg  daily. He will call or email with BP readings. Follow up in 3 months.      Relevant Medications   lisinopril (PRINIVIL,ZESTRIL) 20 MG tablet   Hyperlipidemia (Chronic)    Lipids well controlled on labs. Will continue Simvastatin.      Relevant Medications   lisinopril (PRINIVIL,ZESTRIL) 20 MG tablet    Other Visit Diagnoses   None.      Return in about 3 months (around 11/17/2015) for New Patient.  Ronette Deter, MD Internal Medicine [facility]

## 2015-08-17 NOTE — Patient Instructions (Signed)
Decrease Lisinopril to 20mg  daily.  Continue to monitor blood pressure.  Follow up in 3 months.

## 2015-08-25 ENCOUNTER — Other Ambulatory Visit: Payer: Self-pay

## 2015-08-25 MED ORDER — GLUCOSE BLOOD VI STRP: ORAL_STRIP | 2 refills | Status: DC

## 2015-11-18 ENCOUNTER — Encounter: Payer: Self-pay | Admitting: Family Medicine

## 2015-11-18 ENCOUNTER — Ambulatory Visit (INDEPENDENT_AMBULATORY_CARE_PROVIDER_SITE_OTHER): Payer: Medicare Other | Admitting: Family Medicine

## 2015-11-18 VITALS — BP 152/94 | HR 85 | Temp 98.1°F | Wt 243.8 lb

## 2015-11-18 DIAGNOSIS — E785 Hyperlipidemia, unspecified: Secondary | ICD-10-CM | POA: Diagnosis not present

## 2015-11-18 DIAGNOSIS — I10 Essential (primary) hypertension: Secondary | ICD-10-CM

## 2015-11-18 DIAGNOSIS — E119 Type 2 diabetes mellitus without complications: Secondary | ICD-10-CM | POA: Diagnosis not present

## 2015-11-18 DIAGNOSIS — M25561 Pain in right knee: Secondary | ICD-10-CM | POA: Insufficient documentation

## 2015-11-18 DIAGNOSIS — E1159 Type 2 diabetes mellitus with other circulatory complications: Secondary | ICD-10-CM | POA: Diagnosis not present

## 2015-11-18 DIAGNOSIS — G8929 Other chronic pain: Secondary | ICD-10-CM

## 2015-11-18 LAB — HEMOGLOBIN A1C: Hgb A1c MFr Bld: 7.1 % — ABNORMAL HIGH (ref 4.6–6.5)

## 2015-11-18 NOTE — Patient Instructions (Signed)
Nice to meet you. Please continue to monitor your blood pressure at home. If it is consistently greater than 130/90 please let us know. Please continue your current blood pressure medication and diabetes medication regimens. If your knee pain gets worse please let us know.

## 2015-11-18 NOTE — Assessment & Plan Note (Signed)
Moderately well controlled. We'll check an A1c today. Continue current medication regimen.

## 2015-11-18 NOTE — Assessment & Plan Note (Signed)
Chronic issue. Likely related to arthritis or degenerative meniscal issue. No pain now. Benign exam. Discussed continuing to monitor. He can continue the knee brace as needed. If worsens will let us know.

## 2015-11-18 NOTE — Assessment & Plan Note (Signed)
Slightly elevated today. Has been really well controlled at home and most recently in the office. He'll continue his current medications. He'll monitor his blood pressure at home and if it is consistently higher than 130/90 he will let us know.

## 2015-11-18 NOTE — Progress Notes (Signed)
  [name], MD Phone: [phone]  Dakota Gonzalez is a 70 y.o. male who presents today for f/u.  HYPERTENSION Disease Monitoring: Blood pressure range-110-130/70-75 Chest pain- no      Dyspnea- no Medications: Compliance- taking lisinopril Lightheadedness- no   Edema- no  DIABETES Disease Monitoring: Blood Sugar ranges-160-170 in am, 120-130 prior to lunch Polyuria/phagia/dipsia- no      ophthalmology- due in December Medications: Compliance- taking glimeperide Hypoglycemic symptoms- no  HYPERLIPIDEMIA Disease Monitoring: See symptoms for Hypertension Medications: Compliance- taking simvastatin Right upper quadrant pain- no  Muscle aches- no  Right knee pain: Patient notes this is chronic and intermittent. Does not bother him now. Notes he can go months without it bothering him daily for a certain period of time. Notes he wears a knee brace and this is beneficial. Typically starts out of nowhere. Can hurt him more if he walks too fast. It tries to give out on him though doesn't give out on him. No prior imaging.  PMH: Former smoker   ROS see history of present illness  Objective  Physical Exam Vitals:   11/18/15 0901  BP: (!) 152/94  Pulse: 85  Temp: 98.1 F (36.7 C)    BP Readings from Last 3 Encounters:  11/18/15 (!) 152/94  08/17/15 132/80  06/24/15 114/76   Wt Readings from Last 3 Encounters:  11/18/15 243 lb 12.8 oz (110.6 kg)  08/17/15 241 lb (109.3 kg)  06/24/15 242 lb 12.8 oz (110.1 kg)    Physical Exam  Constitutional: He is well-developed, well-nourished, and in no distress.  Cardiovascular: Normal rate, regular rhythm and normal heart sounds.   Pulmonary/Chest: Effort normal and breath sounds normal.  Musculoskeletal: He exhibits no edema.  Bilateral knees with no swelling, warmth, erythema, or tenderness, no ligamentous laxity, negative McMurray's  Neurological: He is alert. Gait normal.  Skin: Skin is warm and dry.      Assessment/Plan: Please see individual problem list.  HTN (hypertension) Slightly elevated today. Has been really well controlled at home and most recently in the office. He'll continue his current medications. He'll monitor his blood pressure at home and if it is consistently higher than 130/90 he will let us know.  Diabetes mellitus type 2, controlled ([place]) Moderately well controlled. We'll check an A1c today. Continue current medication regimen.  Hyperlipidemia Tolerating simvastatin. Continue this medication.  Right knee pain Chronic issue. Likely related to arthritis or degenerative meniscal issue. No pain now. Benign exam. Discussed continuing to monitor. He can continue the knee brace as needed. If worsens will let us know.   Orders Placed This Encounter  Procedures  . HgB A1c    [name], MD [place]

## 2015-11-18 NOTE — Progress Notes (Signed)
Pre visit review using our clinic review tool, if applicable. No additional management support is needed unless otherwise documented below in the visit note. 

## 2015-11-18 NOTE — Assessment & Plan Note (Signed)
Tolerating simvastatin. Continue this medication. 

## 2015-11-29 ENCOUNTER — Telehealth: Payer: Self-pay | Admitting: Internal Medicine

## 2015-11-29 NOTE — Telephone Encounter (Signed)
Pt is leaving stated that you may leave a msg with results. Thank you!

## 2015-11-29 NOTE — Telephone Encounter (Signed)
LM for patient to return call.

## 2015-11-29 NOTE — Telephone Encounter (Signed)
Pt called back returning your call. Thank you! °

## 2015-11-29 NOTE — Telephone Encounter (Signed)
Pt called returning your call regarding lab results. Thank you!  Call pt @ [phone]

## 2015-11-30 NOTE — Telephone Encounter (Signed)
Spoke with patient see result note 

## 2015-11-30 NOTE — Telephone Encounter (Signed)
Pt called you back regarding a question that you had for him. Thank you!  Call pt@ [phone]

## 2015-12-19 ENCOUNTER — Other Ambulatory Visit: Payer: Self-pay | Admitting: Family Medicine

## 2015-12-19 NOTE — Telephone Encounter (Signed)
Please advise on refill.

## 2015-12-19 NOTE — Telephone Encounter (Signed)
Sent to pharmacy 

## 2016-02-15 DIAGNOSIS — E119 Type 2 diabetes mellitus without complications: Secondary | ICD-10-CM | POA: Diagnosis not present

## 2016-02-15 LAB — HM DIABETES EYE EXAM

## 2016-02-16 ENCOUNTER — Other Ambulatory Visit: Payer: Self-pay | Admitting: Family Medicine

## 2016-02-16 MED ORDER — SIMVASTATIN 40 MG PO TABS: ORAL_TABLET | ORAL | 3 refills | Status: DC

## 2016-02-16 NOTE — Telephone Encounter (Signed)
Last filled by Dr.Walker 02/15/15 90 3rf last OV 11/18/2015

## 2016-02-16 NOTE — Telephone Encounter (Signed)
simvastatin (ZOCOR) 40 MG tablet take one tablet by mouth every day in the evening. Qty 90.

## 2016-02-17 ENCOUNTER — Telehealth: Payer: Self-pay | Admitting: Family Medicine

## 2016-02-17 MED ORDER — GLUCOSE BLOOD VI STRP: ORAL_STRIP | 2 refills | Status: DC

## 2016-02-17 NOTE — Telephone Encounter (Signed)
One Touch Ultra Blue Test Strips (new) 100 use to test twice daily as needed. Qty 100.

## 2016-02-17 NOTE — Telephone Encounter (Signed)
Sent to pharmacy 

## 2016-02-20 ENCOUNTER — Encounter: Payer: Self-pay | Admitting: Family Medicine

## 2016-02-21 ENCOUNTER — Ambulatory Visit (INDEPENDENT_AMBULATORY_CARE_PROVIDER_SITE_OTHER): Payer: Medicare Other | Admitting: Family Medicine

## 2016-02-21 ENCOUNTER — Encounter: Payer: Self-pay | Admitting: Family Medicine

## 2016-02-21 VITALS — BP 128/90 | HR 87 | Temp 98.2°F | Wt 238.4 lb

## 2016-02-21 DIAGNOSIS — E785 Hyperlipidemia, unspecified: Secondary | ICD-10-CM | POA: Diagnosis not present

## 2016-02-21 DIAGNOSIS — E1159 Type 2 diabetes mellitus with other circulatory complications: Secondary | ICD-10-CM | POA: Diagnosis not present

## 2016-02-21 DIAGNOSIS — G2581 Restless legs syndrome: Secondary | ICD-10-CM

## 2016-02-21 DIAGNOSIS — I10 Essential (primary) hypertension: Secondary | ICD-10-CM | POA: Diagnosis not present

## 2016-02-21 LAB — COMPREHENSIVE METABOLIC PANEL
ALBUMIN: 4.8 g/dL (ref 3.5–5.2)
ALT: 29 U/L (ref 0–53)
AST: 26 U/L (ref 0–37)
Alkaline Phosphatase: 60 U/L (ref 39–117)
BILIRUBIN TOTAL: 1.2 mg/dL (ref 0.2–1.2)
BUN: 9 mg/dL (ref 6–23)
CO2: 29 mEq/L (ref 19–32)
CREATININE: 0.92 mg/dL (ref 0.40–1.50)
Calcium: 10.3 mg/dL (ref 8.4–10.5)
Chloride: 101 mEq/L (ref 96–112)
GFR: 86.53 mL/min (ref >60.00)
Glucose, Bld: 135 mg/dL — ABNORMAL HIGH (ref 70–99)
POTASSIUM: 4.2 meq/L (ref 3.5–5.1)
SODIUM: 138 meq/L (ref 135–145)
Total Protein: 8 g/dL (ref 6.0–8.3)

## 2016-02-21 LAB — HEMOGLOBIN A1C: Hgb A1c MFr Bld: 7.2 % — ABNORMAL HIGH (ref 4.6–6.5)

## 2016-02-21 LAB — LDL CHOLESTEROL, DIRECT: LDL DIRECT: 95 mg/dL

## 2016-02-21 NOTE — Progress Notes (Signed)
Pre visit review using our clinic review tool, if applicable. No additional management support is needed unless otherwise documented below in the visit note. 

## 2016-02-21 NOTE — Patient Instructions (Signed)
Nice to see you. We will check some lab work and contact you with the results. You may taper off of your Requip. You should take half a tablet for 2 weeks and then half tablet every other day for 2 weeks and then discontinue.

## 2016-02-21 NOTE — Assessment & Plan Note (Signed)
At goal. Continue current medications. Check lab work as outlined below. 

## 2016-02-21 NOTE — Assessment & Plan Note (Signed)
Well-controlled CBGs. Check A1c. Foot exam completed.

## 2016-02-21 NOTE — Assessment & Plan Note (Signed)
Patient is interested in coming off of Requip. Advised on tapering instructions in AVS. Discussed that if he comes off of it and his symptoms recur he can go back on it at half a tablet and then increase to a whole tablet after 2 weeks.

## 2016-02-21 NOTE — Progress Notes (Signed)
  [name], MD Phone: [phone]  Dakota Gonzalez is a 71 y.o. male who presents today for f/u.  HYPERTENSION Disease Monitoring: Blood pressure range-115-125/70's Chest pain- no      Dyspnea- no Medications: Compliance- taking lisinopril   Edema- no  DIABETES Disease Monitoring: Blood Sugar ranges-84-170 Polyuria/phagia/dipsia- no     optho- UTD Medications: Compliance- taking glimeperide Hypoglycemic symptoms- 1-2 per week when sugars are less than 90  HYPERLIPIDEMIA Disease Monitoring: See symptoms for Hypertension Medications: Compliance- taking simvastatin Right upper quadrant pain- no  Muscle aches- no  Restless leg syndrome: Patient notes no symptoms at this time. He wonders if he could come off the Requip.  PMH: Former smoker   ROS see history of present illness  Objective  Physical Exam Vitals:   02/21/16 0901 02/21/16 0905  BP: 122/88 128/90  Pulse: 87   Temp: 98.2 F (36.8 C)     BP Readings from Last 3 Encounters:  02/21/16 128/90  11/18/15 (!) 152/94  08/17/15 132/80   Wt Readings from Last 3 Encounters:  02/21/16 238 lb 6.4 oz (108.1 kg)  11/18/15 243 lb 12.8 oz (110.6 kg)  08/17/15 241 lb (109.3 kg)    Physical Exam  Constitutional: No distress.  Cardiovascular: Normal rate, regular rhythm and normal heart sounds.   Pulmonary/Chest: Effort normal and breath sounds normal.  Musculoskeletal: He exhibits no edema.  Neurological: He is alert. Gait normal.  Skin: Skin is warm and dry. He is not diaphoretic.   Diabetic Foot Exam - Simple   Simple Foot Form Diabetic Foot exam was performed with the following findings:  Yes 02/21/2016  9:35 AM  Visual Inspection See comments:  Yes Sensation Testing Intact to touch and monofilament testing bilaterally:  Yes Pulse Check Posterior Tibialis and Dorsalis pulse intact bilaterally:  Yes Comments Thick callus on posterior heels eye laterally, onychomycosis noted on toenails, otherwise no  deformities, ulcerations, or skin breakdown.     Assessment/Plan: Please see individual problem list.  HTN (hypertension) At goal. Continue current medications. Check lab work as outlined below.  Diabetes mellitus type 2, controlled ([place]) Well-controlled CBGs. Check A1c. Foot exam completed.  Hyperlipidemia Tolerating simvastatin. Check direct LDL.  Restless legs Patient is interested in coming off of Requip. Advised on tapering instructions in AVS. Discussed that if he comes off of it and his symptoms recur he can go back on it at half a tablet and then increase to a whole tablet after 2 weeks.   Orders Placed This Encounter  Procedures  . HgB A1c  . Comp Met (CMET)  . Direct LDL    [name], MD [place]

## 2016-02-21 NOTE — Assessment & Plan Note (Signed)
Tolerating simvastatin. Check direct LDL.

## 2016-02-22 ENCOUNTER — Telehealth: Payer: Self-pay | Admitting: *Deleted

## 2016-02-22 NOTE — Telephone Encounter (Signed)
Pt requested lab results  Pt contact [phone]

## 2016-02-22 NOTE — Telephone Encounter (Signed)
See result note.  

## 2016-02-23 ENCOUNTER — Encounter: Payer: Self-pay | Admitting: Family Medicine

## 2016-02-24 ENCOUNTER — Other Ambulatory Visit: Payer: Self-pay | Admitting: Family Medicine

## 2016-02-24 MED ORDER — ATORVASTATIN CALCIUM 40 MG PO TABS
40.0000 mg | ORAL_TABLET | Freq: Every day | ORAL | 3 refills | Status: DC
Start: 2016-02-24 — End: 2017-01-31

## 2016-02-27 ENCOUNTER — Other Ambulatory Visit: Payer: Self-pay | Admitting: Family Medicine

## 2016-02-27 ENCOUNTER — Telehealth: Payer: Self-pay | Admitting: Family Medicine

## 2016-02-27 MED ORDER — METFORMIN HCL 500 MG PO TABS: ORAL_TABLET | ORAL | 3 refills | Status: DC

## 2016-02-27 NOTE — Telephone Encounter (Signed)
That is incorrect. I have resent the correct prescription. Should be once daily for 7 days then twice daily. Thanks to the pharmacist for catching that.

## 2016-02-27 NOTE — Telephone Encounter (Signed)
Dorian Pod from [place] called and needed clarification on metFORMIN (GLUCOPHAGE) 500 MG tablet. Please advise, thank you!  Pharmacy - [facility] [address], [place]

## 2016-02-27 NOTE — Telephone Encounter (Signed)
rx states take 1 for 7 days and then take 1 daily, is this correct?

## 2016-03-16 ENCOUNTER — Other Ambulatory Visit: Payer: Self-pay | Admitting: Family Medicine

## 2016-04-24 ENCOUNTER — Other Ambulatory Visit: Payer: Self-pay

## 2016-04-24 MED ORDER — GLIMEPIRIDE 4 MG PO TABS: ORAL_TABLET | ORAL | 2 refills | Status: DC

## 2016-04-24 NOTE — Telephone Encounter (Signed)
Last OV 02/21/16 last filled by Dr.Walker 06/22/15 180 2rf

## 2016-04-25 ENCOUNTER — Ambulatory Visit (INDEPENDENT_AMBULATORY_CARE_PROVIDER_SITE_OTHER): Payer: Medicare Other

## 2016-04-25 VITALS — BP 142/78 | HR 98 | Temp 98.3°F | Resp 12 | Ht 70.0 in | Wt 240.0 lb

## 2016-04-25 DIAGNOSIS — Z0000 Encounter for general adult medical examination without abnormal findings: Secondary | ICD-10-CM | POA: Diagnosis not present

## 2016-04-25 NOTE — Patient Instructions (Addendum)
  Mr. Dakota Gonzalez , Thank you for taking time to come for your Medicare Wellness Visit. I appreciate your ongoing commitment to your health goals. Please review the following plan we discussed and let me know if I can assist you in the future.   Follow up with Dr. Caryl Bis as needed.    Have a great day!  These are the goals we discussed: Goals    . Increase water intake          Stay hydrated       This is a list of the screening recommended for you and due dates:  Health Maintenance  Topic Date Due  .  Hepatitis C: One time screening is recommended by Center for Disease Control  (CDC) for  adults born from [birth year] through 1964.   1945-08-16  . Tetanus Vaccine  06/28/1965  . Flu Shot  08/15/2016  . Hemoglobin A1C  08/20/2016  . Eye exam for diabetics  02/14/2017  . Complete foot exam   02/20/2017  . Cologuard (Stool DNA test)  08/24/2017  . Pneumonia vaccines  Completed

## 2016-04-25 NOTE — Progress Notes (Signed)
Subjective:   Dakota Gonzalez is a 71 y.o. male who presents for Medicare Annual/Subsequent preventive examination.  Review of Systems:  No ROS.  Medicare Wellness Visit. Cardiac Risk Factors include: advanced age (>22mn, >>69women);obesity (BMI >30kg/m2);diabetes mellitus;hypertension;male gender     Objective:    Vitals: BP (!) 142/78 (BP Location: Left Arm, Patient Position: Sitting, Cuff Size: Normal)   Pulse 98   Temp 98.3 F (36.8 C) (Oral)   Resp 12   Ht '5\' 10"'  (1.778 m)   Wt 240 lb (108.9 kg)   SpO2 97%   BMI 34.44 kg/m   Body mass index is 34.44 kg/m.  Tobacco History  Smoking Status  . Former Smoker  Smokeless Tobacco  . Never Used     Counseling given: Not Answered   Past Medical History:  Diagnosis Date  . Allergy   . Arthritis   . Asthma   . Depression   . Diabetes mellitus 2007  . Enlarged prostate   . GERD (gastroesophageal reflux disease)   . Headache(784.0)   . Hyperlipidemia   . Hypertension   . Migraine   . Sinusitis    Past Surgical History:  Procedure Laterality Date  . BACK SURGERY  10/2003   [facility] HEM[facility]OMA EXC[facility]  2014   right upper arm, Dr. SRonalee Belts . PROSTATE BIOPSY  2014   Dr. CJacqlyn Larsen . TONSILLECTOMY AND ADENOIDECTOMY  1960   Family History  Problem Relation Age of Onset  . Stroke Mother   . Kidney disease Mother   . Diabetes Mother   . Cancer Father   . Stroke Father   . Kidney disease Father   . Cancer Other     colon, breast   History  Sexual Activity  . Sexual activity: No    Outpatient Encounter Prescriptions as of 04/25/2016  Medication Sig  . aspirin 81 MG tablet Take 81 mg by mouth daily.  .Marland Kitchenatorvastatin (LIPITOR) 40 MG tablet Take 1 tablet (40 mg total) by mouth daily.  . Blood Glucose Monitoring Suppl (ONE TOUCH ULTRA SYSTEM KIT) W/DEVICE KIT 1 kit by Does not apply route once.  . Coenzyme Q10 (COQ-10) 100 MG CAPS Take 1 tablet by mouth daily.  .  cyclobenzaprine (FLEXERIL) 10 MG tablet Take 1 tablet (10 mg total) by mouth every 8 (eight) hours as needed for muscle spasms (do not drive while using, may make you drowsy).  . finasteride (PROSCAR) 5 MG tablet Take 5 mg by mouth daily.  .Marland Kitchenglimepiride (AMARYL) 4 MG tablet Take one tab twice daily  . glucose blood (ONE TOUCH ULTRA TEST) test strip USE TO TEST TWICE DAILY AS NEEDED  . lisinopril (PRINIVIL,ZESTRIL) 20 MG tablet Take 1 tablet (20 mg total) by mouth daily.  . metFORMIN (GLUCOPHAGE) 500 MG tablet Take 500 mg (one tablet) by mouth daily for 7 days, then take 500 mg (one tablet) by mouth twice daily  . Multiple Vitamins-Minerals (CENTRUM SILVER PO) Take 1 tablet by mouth daily.  . ONE TOUCH LANCETS MISC Use as directed. One Touch Delica. Dx: 250.00  . rOPINIRole (REQUIP) 0.5 MG tablet TAKE 1 TABLET BY MOUTH AT BEDTIME  . S-Adenosylmethionine (SAM-E COMPLETE PO) Take by mouth.  . STOOL SOFTENER 100 MG capsule   . tamsulosin (FLOMAX) 0.4 MG CAPS capsule Take 1 capsule (0.4 mg total) by mouth daily.   No facility-administered encounter medications on file as of 04/25/2016.     Activities  of Daily Living In your present state of health, do you have any difficulty performing the following activities: 04/25/2016  Hearing? N  Vision? N  Difficulty concentrating or making decisions? N  Walking or climbing stairs? Y  Dressing or bathing? N  Doing errands, shopping? N  Preparing Food and eating ? N  Using the Toilet? N  In the past six months, have you accidently leaked urine? N  Do you have problems with loss of bowel control? N  Managing your Medications? N  Managing your Finances? N  Housekeeping or managing your Housekeeping? N  Some recent data might be hidden    Patient Care Team: [name], MD as PCP - General (Family Medicine) Wellington Hampshire, MD as Consulting Physician (Cardiology)   Assessment:    This is a routine wellness examination for Tann[place]. The goal of  the wellness visit is to assist the patient how to close the gaps in care and create a preventative care plan for the patient.   Osteoporosis risk reviewed.  Medications reviewed; taking without issues or barriers.  Safety issues reviewed; smoke detectors in the home. No firearms in the home. Wears seatbelts when driving or riding with others. Patient does wear sunscreen or protective clothing when in direct sunlight. No violence in the home.  Patient is alert, normal appearance, oriented to person/place/and time. Correctly identified the president of the [place], recall of 3/3 words, and performing simple calculations.  Patient displays appropriate judgement and can read correct time from watch face.  No new identified risk were noted.  No failures at ADL's or IADL's.   BMI- discussed the importance of a healthy diet, water intake and exercise. Educational material provided.   Diet: Breakfast: Protein shake or cereal Lunch: Deli sandwich/wrap and soup Dinner: Dance movement psychotherapist, green vegetable Daily fluid intake: 1 cups of caffeine, 4 cups of water  HTN- Blood pressure taken twice, same reading.  Hypertensive medications taken.  Dental- Wears a partial, does not have regular follow up.  Encouraged to make an appointment with his dentist.  Eye- Visual acuity not assessed per patient preference since they have regular follow up with the ophthalmologist.  Wears corrective lenses.  Sleep patterns- Sleeps 7-8 hours at night.  Wakes feeling rested.  TDAP vaccine deferred per patient preference.  Educational material provided.  Hepatitis C Screening discussed, educational material provided.  Health maintenance gaps- closed.  Patient Concerns: Extremities feel cold since starting metformin and lipitor in February.  He noticed the change after  The first 2 weeks.  No other symptoms present.  Offered a follow up sooner than scheduled, deferred at this time.  He has an upcoming appointment in  May and will wait unless otherwise directed by PCP.  Exercise Activities and Dietary recommendations Current Exercise Habits: Structured exercise class, Type of exercise: walking;treadmill;calisthenics, Time (Minutes): 45, Frequency (Times/Week): 3, Weekly Exercise (Minutes/Week): 135, Intensity: Moderate  Goals    . Increase water intake          Stay hydrated      Fall Risk Fall Risk  04/25/2016 08/17/2015 06/24/2015 12/15/2014 12/14/2013  Falls in the past year? No No No No No   Depression Screen PHQ 2/9 Scores 04/25/2016 08/17/2015 06/24/2015 12/15/2014  PHQ - 2 Score 1 0 0 2    Cognitive Function MMSE - Mini Mental State Exam 04/25/2016  Orientation to time 5  Orientation to Place 5  Registration 3  Attention/ Calculation 5  Recall 3  Language- name 2  objects 2  Language- repeat 1  Language- follow 3 step command 3  Language- read & follow direction 1  Write a sentence 1  Copy design 1  Total score 30        Immunization History  Administered Date(s) Administered  . Influenza Split 10/17/2011  . Influenza,inj,Quad PF,36+ Mos 09/22/2012, 10/10/2013, 11/10/2014  . Pneumococcal Conjugate-13 06/12/2013  . Pneumococcal Polysaccharide-23 10/17/2011   Screening Tests Health Maintenance  Topic Date Due  . Hepatitis C Screening  [birth year]  . TETANUS/TDAP  06/28/1964  . INFLUENZA VACCINE  08/15/2016  . HEMOGLOBIN A1C  08/20/2016  . OPHTHALMOLOGY EXAM  02/14/2017  . FOOT EXAM  02/20/2017  . Fecal DNA (Cologuard)  08/24/2017  . PNA vac Low Risk Adult  Completed      Plan:    End of life planning; Advanced aging; Advanced directives discussed.  No HCPOA/Living Will.  Additional information declined at this time.  Medicare Attestation I have personally reviewed: The patient's medical and social history Their use of alcohol, tobacco or illicit drugs Their current medications and supplements The patient's functional ability including ADLs,fall risks, home safety risks,  cognitive, and hearing and visual impairment Diet and physical activities Evidence for depression   The patient's weight, height, BMI, and visual acuity have been recorded in the chart.  I have made referrals and provided education to the patient based on review of the above and I have provided the patient with a written personalized care plan for preventive services.    During the course of the visit the patient was educated and counseled about the following appropriate screening and preventive services:   Vaccines to include Pneumoccal, Influenza, Hepatitis B, Td, Zostavax, HCV  Colorectal cancer screening-UTD  Diabetes-followed by PCP  Prostate Cancer Screening-05/17/15, wnl  Glaucoma screening-annual eye exam  Nutrition counseling  Patient Instructions (the written plan) was given to the patient.    Varney Biles, LPN  [id number]

## 2016-04-27 NOTE — Progress Notes (Signed)
I have reviewed the above note and agree. I would suggest sooner follow-up given his concerns. Please offer sooner follow-up again.  [name], M.D.

## 2016-05-02 NOTE — Progress Notes (Signed)
Patient has again declined rescheduling appointment to be seen sooner.  He states extremities are not as cold as often, now that the weather is warming up outside.  Encouraged him to call the office if condition worsens or new symptoms present.  Follow up as needed.

## 2016-05-03 ENCOUNTER — Telehealth: Payer: Self-pay | Admitting: Family Medicine

## 2016-05-03 NOTE — Telephone Encounter (Signed)
Your welcome.

## 2016-05-03 NOTE — Telephone Encounter (Signed)
Perfect. Thank you!

## 2016-05-03 NOTE — Telephone Encounter (Signed)
Pt called returning your call. Pt told me he wanted a sooner appt with Dr Caryl Bis. I went ahead and scheduled pt for tomorrow. Thank you!

## 2016-05-04 ENCOUNTER — Encounter: Payer: Self-pay | Admitting: Family Medicine

## 2016-05-04 ENCOUNTER — Ambulatory Visit (INDEPENDENT_AMBULATORY_CARE_PROVIDER_SITE_OTHER): Payer: Medicare Other | Admitting: Family Medicine

## 2016-05-04 DIAGNOSIS — R209 Unspecified disturbances of skin sensation: Secondary | ICD-10-CM

## 2016-05-04 NOTE — Patient Instructions (Signed)
Nice to see you. We will see how you do with regards to this issue and if it is not continuing to improve we'll consider further workup at your follow-up visit.

## 2016-05-04 NOTE — Assessment & Plan Note (Signed)
Patient with reports of cold hands and feet for some time now. This is not listed as a side effect for Lipitor and metformin. He has good blood flow on exam. Intact pulses. Has improved to some degree with the warmer weather. Discussed continuing to monitor until his follow-up next month. If continuing to have the issue at that time could consider checking a TSH versus other workup.

## 2016-05-04 NOTE — Progress Notes (Signed)
Pre visit review using our clinic review tool, if applicable. No additional management support is needed unless otherwise documented below in the visit note. 

## 2016-05-04 NOTE — Progress Notes (Signed)
  [name], MD Phone: [phone]  Dakota Gonzalez is a 71 y.o. male who presents today for same-day visit.  Patient notes over the last several months his feet and legs and hands have felt colder than usual. Notes they feel cold to him and to the touch. Notes it has improved some since it has become warmer outside. No issues today. He has no pain or color changes in his extremities. Notes it was worse when it is cold outside. Started about 2 weeks after he started on metformin and Lipitor. He had been on a statin previously. He's not had any adverse effects from the Lipitor.   ROS see history of present illness  Objective  Physical Exam Vitals:   05/04/16 1530  BP: 132/80  Pulse: 85  Temp: 98.4 F (36.9 C)    BP Readings from Last 3 Encounters:  05/04/16 132/80  04/25/16 (!) 142/78  02/21/16 128/90   Wt Readings from Last 3 Encounters:  05/04/16 241 lb 6.4 oz (109.5 kg)  04/25/16 240 lb (108.9 kg)  02/21/16 238 lb 6.4 oz (108.1 kg)    Physical Exam  Constitutional: He is well-developed, well-nourished, and in no distress.  Cardiovascular: Normal rate, regular rhythm and normal heart sounds.   Pulmonary/Chest: Effort normal and breath sounds normal.  Skin:  Bilateral hands and feet and legs feel warm, there is no skin changes noted, good capillary refill in his fingers and toes, 2+ DP and PT pulses bilaterally     Assessment/Plan: Please see individual problem list.  Cold hands and feet Patient with reports of cold hands and feet for some time now. This is not listed as a side effect for Lipitor and metformin. He has good blood flow on exam. Intact pulses. Has improved to some degree with the warmer weather. Discussed continuing to monitor until his follow-up next month. If continuing to have the issue at that time could consider checking a TSH versus other workup.    [name], MD [place]

## 2016-05-10 ENCOUNTER — Other Ambulatory Visit: Payer: Self-pay

## 2016-05-10 MED ORDER — TAMSULOSIN HCL 0.4 MG PO CAPS: 0.4000 mg | ORAL_CAPSULE | Freq: Every day | ORAL | 3 refills | Status: DC

## 2016-05-10 NOTE — Telephone Encounter (Signed)
Sent to pharmacy. Please check with patient to see what he takes is for. Thanks.

## 2016-05-10 NOTE — Telephone Encounter (Signed)
Last OV 05/04/2016 last filled by Dr.Walker 05/17/15 90 3rf

## 2016-05-11 NOTE — Telephone Encounter (Signed)
Patient takes this for enlarged prostate

## 2016-05-29 ENCOUNTER — Ambulatory Visit: Payer: Self-pay | Admitting: Family Medicine

## 2016-05-29 ENCOUNTER — Encounter: Payer: Self-pay | Admitting: Family Medicine

## 2016-05-29 ENCOUNTER — Ambulatory Visit (INDEPENDENT_AMBULATORY_CARE_PROVIDER_SITE_OTHER): Payer: Medicare Other | Admitting: Family Medicine

## 2016-05-29 VITALS — BP 140/80 | HR 80 | Temp 98.2°F | Wt 236.4 lb

## 2016-05-29 DIAGNOSIS — R972 Elevated prostate specific antigen [PSA]: Secondary | ICD-10-CM

## 2016-05-29 DIAGNOSIS — E1159 Type 2 diabetes mellitus with other circulatory complications: Secondary | ICD-10-CM

## 2016-05-29 DIAGNOSIS — E785 Hyperlipidemia, unspecified: Secondary | ICD-10-CM

## 2016-05-29 DIAGNOSIS — I10 Essential (primary) hypertension: Secondary | ICD-10-CM

## 2016-05-29 LAB — HEMOGLOBIN A1C: Hgb A1c MFr Bld: 6.7 % — ABNORMAL HIGH (ref 4.6–6.5)

## 2016-05-29 LAB — HEPATIC FUNCTION PANEL
ALBUMIN: 5.1 g/dL (ref 3.5–5.2)
ALK PHOS: 48 U/L (ref 39–117)
ALT: 28 U/L (ref 0–53)
AST: 26 U/L (ref 0–37)
Bilirubin, Direct: 0.3 mg/dL (ref 0.0–0.3)
TOTAL PROTEIN: 7.8 g/dL (ref 6.0–8.3)
Total Bilirubin: 1.5 mg/dL — ABNORMAL HIGH (ref 0.2–1.2)

## 2016-05-29 LAB — LDL CHOLESTEROL, DIRECT: Direct LDL: 74 mg/dL

## 2016-05-29 LAB — PSA: PSA: 1.75 ng/mL (ref 0.10–4.00)

## 2016-05-29 NOTE — Progress Notes (Signed)
  [name], MD Phone: [phone]  Dakota Gonzalez is a 71 y.o. male who presents today for f/u.  HYPERTENSION Disease Monitoring: Blood pressure range-105-120/60-70  Chest pain- no      Dyspnea- no Medications: Compliance- taking lisinopril   Edema- no  DIABETES Disease Monitoring: Blood Sugar ranges-140-150 fasting, 90-110 before dinner Polyuria/phagia/dipsia- no      Optho- UTD Medications: Compliance- taking metformin, glimeperide Hypoglycemic symptoms- on one occasion had a sugar of 70, ate something and felt improved  HYPERLIPIDEMIA Disease Monitoring: See symptoms for Hypertension Medications: Compliance- taking Lipitor Right upper quadrant pain- no  Muscle aches- no Patient notes his hands and feet are no longer feeling cold. He is going to the gym 4 times per week. Walking on the treadmill and doing circuit training.   CLE:XNTZGY smoker  ROS see history of present illness  Objective  Physical Exam Vitals:   05/29/16 0834  BP: 140/80  Pulse: 80  Temp: 98.2 F (36.8 C)    BP Readings from Last 3 Encounters:  05/29/16 140/80  05/04/16 132/80  04/25/16 (!) 142/78   Wt Readings from Last 3 Encounters:  05/29/16 236 lb 6.4 oz (107.2 kg)  05/04/16 241 lb 6.4 oz (109.5 kg)  04/25/16 240 lb (108.9 kg)    Physical Exam  Constitutional: No distress.  HENT:  Head: Normocephalic and atraumatic.  Cardiovascular: Normal rate, regular rhythm and normal heart sounds.   Pulmonary/Chest: Effort normal and breath sounds normal.  Musculoskeletal: He exhibits no edema.  Neurological: He is alert. Gait normal.  Skin: Skin is warm and dry. He is not diaphoretic.     Assessment/Plan: Please see individual problem list.  HTN (hypertension) At goal at home. He'll continue to monitor. Given precautions to call us with. Continue lisinopril.  Diabetes mellitus type 2, controlled ([place]) Seems to be quite well controlled. We will check an A1c  today.  Hyperlipidemia Recheck direct LDL given switched to Lipitor previously. Check hepatic function panel as well.  Elevated PSA Check PSA ahead of him seeing his urologist.   Orders Placed This Encounter  Procedures  . HgB A1c  . Direct LDL  . Hepatic function panel  . PSA   [name], MD [place]

## 2016-05-29 NOTE — Assessment & Plan Note (Signed)
Recheck direct LDL given switched to Lipitor previously. Check hepatic function panel as well.

## 2016-05-29 NOTE — Assessment & Plan Note (Signed)
At goal at home. He'll continue to monitor. Given precautions to call us with. Continue lisinopril.

## 2016-05-29 NOTE — Assessment & Plan Note (Signed)
Seems to be quite well controlled. We will check an A1c today.

## 2016-05-29 NOTE — Patient Instructions (Signed)
Nice to see you. We'll check lab work and contact you with the results. Please continue to monitor your blood pressure. Your goal blood pressure is less than 130/80. If it starts running consistently greater than this please let us know.

## 2016-05-29 NOTE — Assessment & Plan Note (Signed)
Check PSA ahead of him seeing his urologist.

## 2016-05-30 ENCOUNTER — Telehealth: Payer: Self-pay

## 2016-05-30 DIAGNOSIS — R17 Unspecified jaundice: Secondary | ICD-10-CM

## 2016-05-30 NOTE — Telephone Encounter (Signed)
-----   Message from [name], MD sent at 05/29/2016  6:35 PM EDT ----- The patient know his LDL is improved though still not quite at goal. He would benefit from a higher dose of Lipitor. If he is willing to take this higher dose I can send in a prescription. We will need to recheck labs in about a month if we increase the dose. His A1c is improved at 6.7. His bilirubin is minimally elevated and I would like to recheck that as well in about a month. His PSA is essentially stable at 1.75 and he should follow-up with his urologist regarding this. Thanks.

## 2016-05-30 NOTE — Addendum Note (Signed)
Addended by: [name] on: 05/30/2016 05:02 PM   Modules accepted: Orders

## 2016-05-30 NOTE — Telephone Encounter (Signed)
Tried calling, no vm 

## 2016-05-30 NOTE — Telephone Encounter (Signed)
Noted. Order placed.

## 2016-05-30 NOTE — Telephone Encounter (Signed)
Patient notified and does not want to increase medication at this time. Lab for bilirubin recheck scheduled please place orders

## 2016-07-02 ENCOUNTER — Other Ambulatory Visit (INDEPENDENT_AMBULATORY_CARE_PROVIDER_SITE_OTHER): Payer: Medicare Other

## 2016-07-02 DIAGNOSIS — R17 Unspecified jaundice: Secondary | ICD-10-CM

## 2016-07-02 LAB — BILIRUBIN, FRACTIONATED(TOT/DIR/INDIR)
Bilirubin, Direct: 0.3 mg/dL — ABNORMAL HIGH
Indirect Bilirubin: 1.2 mg/dL (ref 0.2–1.2)
Total Bilirubin: 1.5 mg/dL — ABNORMAL HIGH (ref 0.2–1.2)

## 2016-07-02 NOTE — Addendum Note (Signed)
Addended by: Arby Barrette on: 07/02/2016 09:40 AM   Modules accepted: Orders

## 2016-08-07 DIAGNOSIS — N138 Other obstructive and reflux uropathy: Secondary | ICD-10-CM | POA: Diagnosis not present

## 2016-08-07 DIAGNOSIS — R972 Elevated prostate specific antigen [PSA]: Secondary | ICD-10-CM | POA: Diagnosis not present

## 2016-08-07 DIAGNOSIS — N411 Chronic prostatitis: Secondary | ICD-10-CM | POA: Diagnosis not present

## 2016-08-07 DIAGNOSIS — N403 Nodular prostate with lower urinary tract symptoms: Secondary | ICD-10-CM | POA: Diagnosis not present

## 2016-08-07 DIAGNOSIS — R339 Retention of urine, unspecified: Secondary | ICD-10-CM | POA: Diagnosis not present

## 2016-08-17 ENCOUNTER — Other Ambulatory Visit: Payer: Self-pay

## 2016-08-17 MED ORDER — LISINOPRIL 20 MG PO TABS: 20.0000 mg | ORAL_TABLET | Freq: Every day | ORAL | 3 refills | Status: DC

## 2016-08-17 NOTE — Telephone Encounter (Signed)
Last OV 05/29/16 last filled 08/17/15 by Dr.Walker 90 3rf

## 2016-08-29 ENCOUNTER — Ambulatory Visit (INDEPENDENT_AMBULATORY_CARE_PROVIDER_SITE_OTHER): Payer: Medicare Other | Admitting: Family Medicine

## 2016-08-29 ENCOUNTER — Encounter: Payer: Self-pay | Admitting: Family Medicine

## 2016-08-29 VITALS — BP 160/90 | HR 80 | Temp 98.3°F | Wt 237.0 lb

## 2016-08-29 DIAGNOSIS — R972 Elevated prostate specific antigen [PSA]: Secondary | ICD-10-CM

## 2016-08-29 DIAGNOSIS — I10 Essential (primary) hypertension: Secondary | ICD-10-CM | POA: Diagnosis not present

## 2016-08-29 DIAGNOSIS — E785 Hyperlipidemia, unspecified: Secondary | ICD-10-CM

## 2016-08-29 DIAGNOSIS — E1159 Type 2 diabetes mellitus with other circulatory complications: Secondary | ICD-10-CM | POA: Diagnosis not present

## 2016-08-29 LAB — COMPREHENSIVE METABOLIC PANEL
ALBUMIN: 4.9 g/dL (ref 3.5–5.2)
ALT: 27 U/L (ref 0–53)
AST: 25 U/L (ref 0–37)
Alkaline Phosphatase: 47 U/L (ref 39–117)
BUN: 13 mg/dL (ref 6–23)
CALCIUM: 10.3 mg/dL (ref 8.4–10.5)
CHLORIDE: 103 meq/L (ref 96–112)
CO2: 30 mEq/L (ref 19–32)
Creatinine, Ser: 0.93 mg/dL (ref 0.40–1.50)
GFR: 85.33 mL/min (ref >60.00)
Glucose, Bld: 100 mg/dL — ABNORMAL HIGH (ref 70–99)
POTASSIUM: 4.4 meq/L (ref 3.5–5.1)
Sodium: 140 mEq/L (ref 135–145)
Total Bilirubin: 1.3 mg/dL — ABNORMAL HIGH (ref 0.2–1.2)
Total Protein: 7.2 g/dL (ref 6.0–8.3)

## 2016-08-29 LAB — HEMOGLOBIN A1C: Hgb A1c MFr Bld: 6.8 % — ABNORMAL HIGH (ref 4.6–6.5)

## 2016-08-29 LAB — LDL CHOLESTEROL, DIRECT: LDL DIRECT: 70 mg/dL

## 2016-08-29 MED ORDER — AMLODIPINE BESYLATE 2.5 MG PO TABS: 2.5000 mg | ORAL_TABLET | Freq: Every day | ORAL | 3 refills | Status: DC

## 2016-08-29 NOTE — Assessment & Plan Note (Signed)
Followed by urology. Continue to monitor.

## 2016-08-29 NOTE — Assessment & Plan Note (Addendum)
Elevated at times at home. We will add a small dose of amlodipine as he has not tolerated higher doses of lisinopril previously. Continue lisinopril. Check lab work as outlined below. He'll follow-up in one month for nurse check for BP.

## 2016-08-29 NOTE — Assessment & Plan Note (Signed)
Check LDL.  Continue Lipitor. 

## 2016-08-29 NOTE — Assessment & Plan Note (Signed)
Controlled. Recheck A1c. Continue current medications.

## 2016-08-29 NOTE — Progress Notes (Signed)
  [name], MD Phone: [phone]  CHOZEN LATULIPPE is a 71 y.o. male who presents today for f/u.  HYPERTENSION Disease Monitoring: Blood pressure range-120-130/70-80 late in the day, if he checks it in the morning it is in the 150V or 697X systolically Chest pain- no      Dyspnea- no Medications: Compliance- taking lisinopril    Edema- no  DIABETES Disease Monitoring: Blood Sugar ranges-130-150, rare lows in to the 76s Polyuria/phagia/dipsia- no      Optho- UTD Medications: Compliance- taking glimeperide, metformin Hypoglycemic symptoms- rare, he will eat something and this will improve  HYPERLIPIDEMIA Disease Monitoring: See symptoms for Hypertension Medications: Compliance- taking Lipitor Right upper quadrant pain- no  Muscle aches- no He goes to the gym to work out 3 days a week.  History of elevated PSA: Followed by urology. Recently saw them and they plan to continue to monitor. PSA was overall stable. Urology note reviewed.  PMH: Former smoker   ROS see history of present illness  Objective  Physical Exam Vitals:   08/29/16 0907  BP: (!) 160/90  Pulse: 80  Temp: 98.3 F (36.8 C)  SpO2: 97%    BP Readings from Last 3 Encounters:  08/29/16 (!) 160/90  05/29/16 140/80  05/04/16 132/80   Wt Readings from Last 3 Encounters:  08/29/16 237 lb (107.5 kg)  05/29/16 236 lb 6.4 oz (107.2 kg)  05/04/16 241 lb 6.4 oz (109.5 kg)    Physical Exam  Constitutional: No distress.  Cardiovascular: Normal rate, regular rhythm and normal heart sounds.   Pulmonary/Chest: Effort normal and breath sounds normal.  Musculoskeletal: He exhibits no edema.  Neurological: He is alert. Gait normal.  Skin: Skin is warm and dry. He is not diaphoretic.     Assessment/Plan: Please see individual problem list.  Diabetes mellitus type 2, controlled ([place]) Controlled. Recheck A1c. Continue current medications.  Hyperlipidemia Check LDL. Continue Lipitor.  Elevated  PSA Followed by urology. Continue to monitor.  HTN (hypertension) Elevated at times at home. We will add a small dose of amlodipine as he has not tolerated higher doses of lisinopril previously. Continue lisinopril. Check lab work as outlined below. He'll follow-up in one month for nurse check for BP.   Orders Placed This Encounter  Procedures  . HgB A1c  . Comp Met (CMET)  . LDL cholesterol, direct    Meds ordered this encounter  Medications  . amLODipine (NORVASC) 2.5 MG tablet    Sig: Take 1 tablet (2.5 mg total) by mouth daily.    Dispense:  90 tablet    Refill:  [place], MD [place]

## 2016-08-29 NOTE — Patient Instructions (Addendum)
Nice to see you. We'll check lab work today and contact you with the results. We will add a small dose of amlodipine to help with your blood pressure. Please take this in the morning and take the lisinopril at night. If you get lightheadedness with this please let us know.

## 2016-09-16 ENCOUNTER — Encounter: Payer: Self-pay | Admitting: Family Medicine

## 2016-09-16 ENCOUNTER — Other Ambulatory Visit: Payer: Self-pay | Admitting: Family Medicine

## 2016-09-18 MED ORDER — GLUCOSE BLOOD VI STRP: ORAL_STRIP | 2 refills | Status: DC

## 2016-09-18 MED ORDER — ONETOUCH LANCETS MISC: 5 refills | Status: DC

## 2016-10-02 ENCOUNTER — Encounter: Payer: Self-pay | Admitting: *Deleted

## 2016-10-02 ENCOUNTER — Ambulatory Visit (INDEPENDENT_AMBULATORY_CARE_PROVIDER_SITE_OTHER): Payer: Medicare Other | Admitting: *Deleted

## 2016-10-02 VITALS — BP 144/76 | HR 95 | Resp 20

## 2016-10-02 DIAGNOSIS — I10 Essential (primary) hypertension: Secondary | ICD-10-CM | POA: Diagnosis not present

## 2016-10-02 NOTE — Progress Notes (Signed)
Patient presented for BP check one month after starting the addition of amlodipine 2.5 mg with lisinopril 20 mg, patient stated that home reading has been in 120/70 range. BP in office today left arm 146/78 pulse 97, BP in right arm after letting patient rest another 5 - 10 minutes 144/76 pulse 95 . Patient did state he had just left [employer] from working out for 45 minutes to 1 hour.

## 2016-10-03 NOTE — Progress Notes (Signed)
Home BP is well controlled. He should continue to monitor and if they're consistently greater than 130/80 let us know. Plan for follow-up in 3 months. Thanks.

## 2016-10-04 NOTE — Progress Notes (Signed)
Patient notified and voiced understanding.

## 2016-10-18 ENCOUNTER — Encounter: Payer: Self-pay | Admitting: Family Medicine

## 2016-10-18 ENCOUNTER — Ambulatory Visit (INDEPENDENT_AMBULATORY_CARE_PROVIDER_SITE_OTHER): Payer: Medicare Other | Admitting: Family Medicine

## 2016-10-18 DIAGNOSIS — J40 Bronchitis, not specified as acute or chronic: Secondary | ICD-10-CM

## 2016-10-18 NOTE — Progress Notes (Signed)
  [name], MD Phone: [phone]  Dakota Gonzalez is a 71 y.o. male who presents today for same-day visit.  Patient notes onset of cough and chest congestion 2 days ago. Notes he was coughing quite bad and had a little bit of congestion in his chest. Cough is nonproductive. No upper respiratory congestion. He felt feverish on Tuesday and had some sweating though this resolved and has not recurred. No shortness of breath. NyQuil has been beneficial. He notes over the last 24 hours he has started to feel a fair bit better.  ROS see history of present illness  Objective  Physical Exam Vitals:   10/18/16 0820  BP: (!) 148/88  Pulse: 81  Temp: 98.2 F (36.8 C)  SpO2: 97%    BP Readings from Last 3 Encounters:  10/18/16 (!) 148/88  10/02/16 (!) 144/76  08/29/16 (!) 160/90   Wt Readings from Last 3 Encounters:  10/18/16 233 lb 9.6 oz (106 kg)  08/29/16 237 lb (107.5 kg)  05/29/16 236 lb 6.4 oz (107.2 kg)    Physical Exam  Constitutional: No distress.  HENT:  Head: Normocephalic and atraumatic.  Mouth/Throat: Oropharynx is clear and moist. No oropharyngeal exudate.  Eyes: Pupils are equal, round, and reactive to light. Conjunctivae are normal.  Cardiovascular: Normal rate, regular rhythm and normal heart sounds.   Pulmonary/Chest: Effort normal. No respiratory distress. He has no wheezes.  Very faint minimal bibasilar crackles that occur on expiration on first exam that resolved on coughing and did not recur  Neurological: He is alert. Gait normal.  Skin: He is not diaphoretic.     Assessment/Plan: Please see individual problem list.  Bronchitis Symptoms most consistent with viral illness particularly since he has improved already with a very short course of illness. He did have very faint minimal crackles in basilar aspect of his lungs bilaterally that resolved with coughing and did not recur on exam. He has good air movement. Unlikely bacterial illness given  improvement without intervention. Patient will monitor his symptoms and if they worsen he will contact us right away. If worsens over the weekend he'll be reevaluated. He is given return precautions.   [name], MD [place]

## 2016-10-18 NOTE — Assessment & Plan Note (Addendum)
Symptoms most consistent with viral illness particularly since he has improved already with a very short course of illness. He did have very faint minimal crackles in basilar aspect of his lungs bilaterally that resolved with coughing and did not recur on exam. He has good air movement. Unlikely bacterial illness given improvement without intervention. Patient will monitor his symptoms and if they worsen he will contact us right away. If worsens over the weekend he'll be reevaluated. He is given return precautions.

## 2016-10-18 NOTE — Patient Instructions (Signed)
Nice to see you. Please continue to monitor your symptoms since you are feeling better. If if you start to feel worse again please contact us. If you develop shortness of breath, cough productive of blood, or any new or changing symptoms please be reevaluated.

## 2016-12-17 ENCOUNTER — Other Ambulatory Visit: Payer: Self-pay

## 2016-12-17 ENCOUNTER — Encounter: Payer: Self-pay | Admitting: Family Medicine

## 2016-12-17 ENCOUNTER — Ambulatory Visit (INDEPENDENT_AMBULATORY_CARE_PROVIDER_SITE_OTHER): Payer: Medicare Other | Admitting: Family Medicine

## 2016-12-17 VITALS — BP 144/80 | HR 88 | Temp 97.6°F | Wt 235.8 lb

## 2016-12-17 DIAGNOSIS — Z1159 Encounter for screening for other viral diseases: Secondary | ICD-10-CM | POA: Diagnosis not present

## 2016-12-17 DIAGNOSIS — E1159 Type 2 diabetes mellitus with other circulatory complications: Secondary | ICD-10-CM

## 2016-12-17 DIAGNOSIS — E669 Obesity, unspecified: Secondary | ICD-10-CM | POA: Diagnosis not present

## 2016-12-17 DIAGNOSIS — I10 Essential (primary) hypertension: Secondary | ICD-10-CM

## 2016-12-17 DIAGNOSIS — R972 Elevated prostate specific antigen [PSA]: Secondary | ICD-10-CM | POA: Diagnosis not present

## 2016-12-17 LAB — HEPATITIS C ANTIBODY
HEP C AB: NONREACTIVE
SIGNAL TO CUT-OFF: 0.01 (ref <1.00)

## 2016-12-17 NOTE — Assessment & Plan Note (Signed)
A1c today.  Continue current regimen.  Advised to eat something for breakfast particularly if he is going to the gym.

## 2016-12-17 NOTE — Patient Instructions (Signed)
Nice to see you. Please try to eat a little more prior to going to the gym. Please continue to monitor your blood pressure.

## 2016-12-17 NOTE — Assessment & Plan Note (Signed)
Well-controlled at home.  He will continue to monitor at home.  He will contact us if it is greater than 140/90 consistently.

## 2016-12-17 NOTE — Assessment & Plan Note (Signed)
Followed by urology.  They will continue to monitor.

## 2016-12-17 NOTE — Progress Notes (Signed)
  [name], MD Phone: [phone]  Dakota Gonzalez is a 71 y.o. male who presents today for follow-up.  Diabetes: Notes his sugars are up and down though mostly between 130-140.  Occasional excursions to 200.  Taking metformin and glyburide.  No polyuria or polydipsia.  Occasionally feels hypoglycemic with some lightheadedness.  Sugars are only in the 80s when this occurs.  It occurs the days he goes to the gym.  He does drink a meal replacement shake prior to going to the gym though does not eat anything else.  Does not typically eat breakfast.  Hypertension: Typically better than this at home.  Taking lisinopril and amlodipine.  No chest pain, shortness breath, or edema.  History of elevated PSA: Followed by urology.  Currently on Proscar and Flomax.  Voids 1 time nightly.  Urinating well.  PSA has been relatively stable.  Goes the gym 3 days a week.  First meal of the day is typically a sandwich and soup at lunch.  Does not eat breakfast.  Social History   Tobacco Use  Smoking Status Former Smoker  Smokeless Tobacco Never Used     ROS see history of present illness  Objective  Physical Exam Vitals:   12/17/16 1058  BP: (!) 144/80  Pulse: 88  Temp: 97.6 F (36.4 C)  SpO2: 96%    BP Readings from Last 3 Encounters:  12/17/16 (!) 144/80  10/18/16 (!) 148/88  10/02/16 (!) 144/76   Wt Readings from Last 3 Encounters:  12/17/16 235 lb 12.8 oz (107 kg)  10/18/16 233 lb 9.6 oz (106 kg)  08/29/16 237 lb (107.5 kg)    Physical Exam  Constitutional: No distress.  Cardiovascular: Normal rate, regular rhythm and normal heart sounds.  Pulmonary/Chest: Effort normal and breath sounds normal.  Musculoskeletal: He exhibits no edema.  Neurological: He is alert. Gait normal.  Skin: Skin is warm and dry. He is not diaphoretic.     Assessment/Plan: Please see individual problem list.  HTN (hypertension) Well-controlled at home.  He will continue to monitor at home.   He will contact us if it is greater than 140/90 consistently.  Diabetes mellitus type 2, controlled ([place]) A1c today.  Continue current regimen.  Advised to eat something for breakfast particularly if he is going to the gym.  Elevated PSA Followed by urology.  They will continue to monitor.  Obesity (BMI 30-39.9) Weight has been stable.  Discussed diet and exercise.   Dakota Gonzalez was seen today for follow-up.  Diagnoses and all orders for this visit:  Controlled type 2 diabetes mellitus with other circulatory complication, without long-term current use of insulin (HCC) -     Cancel: POCT HgB A1C -     Cancel: HgB A1c -     HgB A1c  Need for hepatitis C screening test -     Hepatitis C Antibody  Essential hypertension  Elevated PSA  Obesity (BMI 30-39.9)    Orders Placed This Encounter  Procedures  . Hepatitis C Antibody  . HgB A1c    No orders of the defined types were placed in this encounter.    [name], MD [place]

## 2016-12-17 NOTE — Assessment & Plan Note (Signed)
Weight has been stable.  Discussed diet and exercise 

## 2016-12-18 LAB — HEMOGLOBIN A1C
HEMOGLOBIN A1C: 6.5 %{Hb} — AB (ref <5.7)
Mean Plasma Glucose: 140 (calc)
eAG (mmol/L): 7.7 (calc)

## 2017-01-12 ENCOUNTER — Other Ambulatory Visit: Payer: Self-pay | Admitting: Family Medicine

## 2017-01-31 ENCOUNTER — Other Ambulatory Visit: Payer: Self-pay | Admitting: Family Medicine

## 2017-02-09 ENCOUNTER — Other Ambulatory Visit: Payer: Self-pay | Admitting: Family Medicine

## 2017-03-14 IMAGING — CR DG SACRUM/COCCYX 2+V
3 series · 3 of 3 positions shown · non-contrast
Comparison: None.

CLINICAL DATA: Coccyx pain.  Fall 1 week ago.

EXAM:
SACRUM AND COCCYX - 2+ VIEW

[view not recorded (1 of 3)]
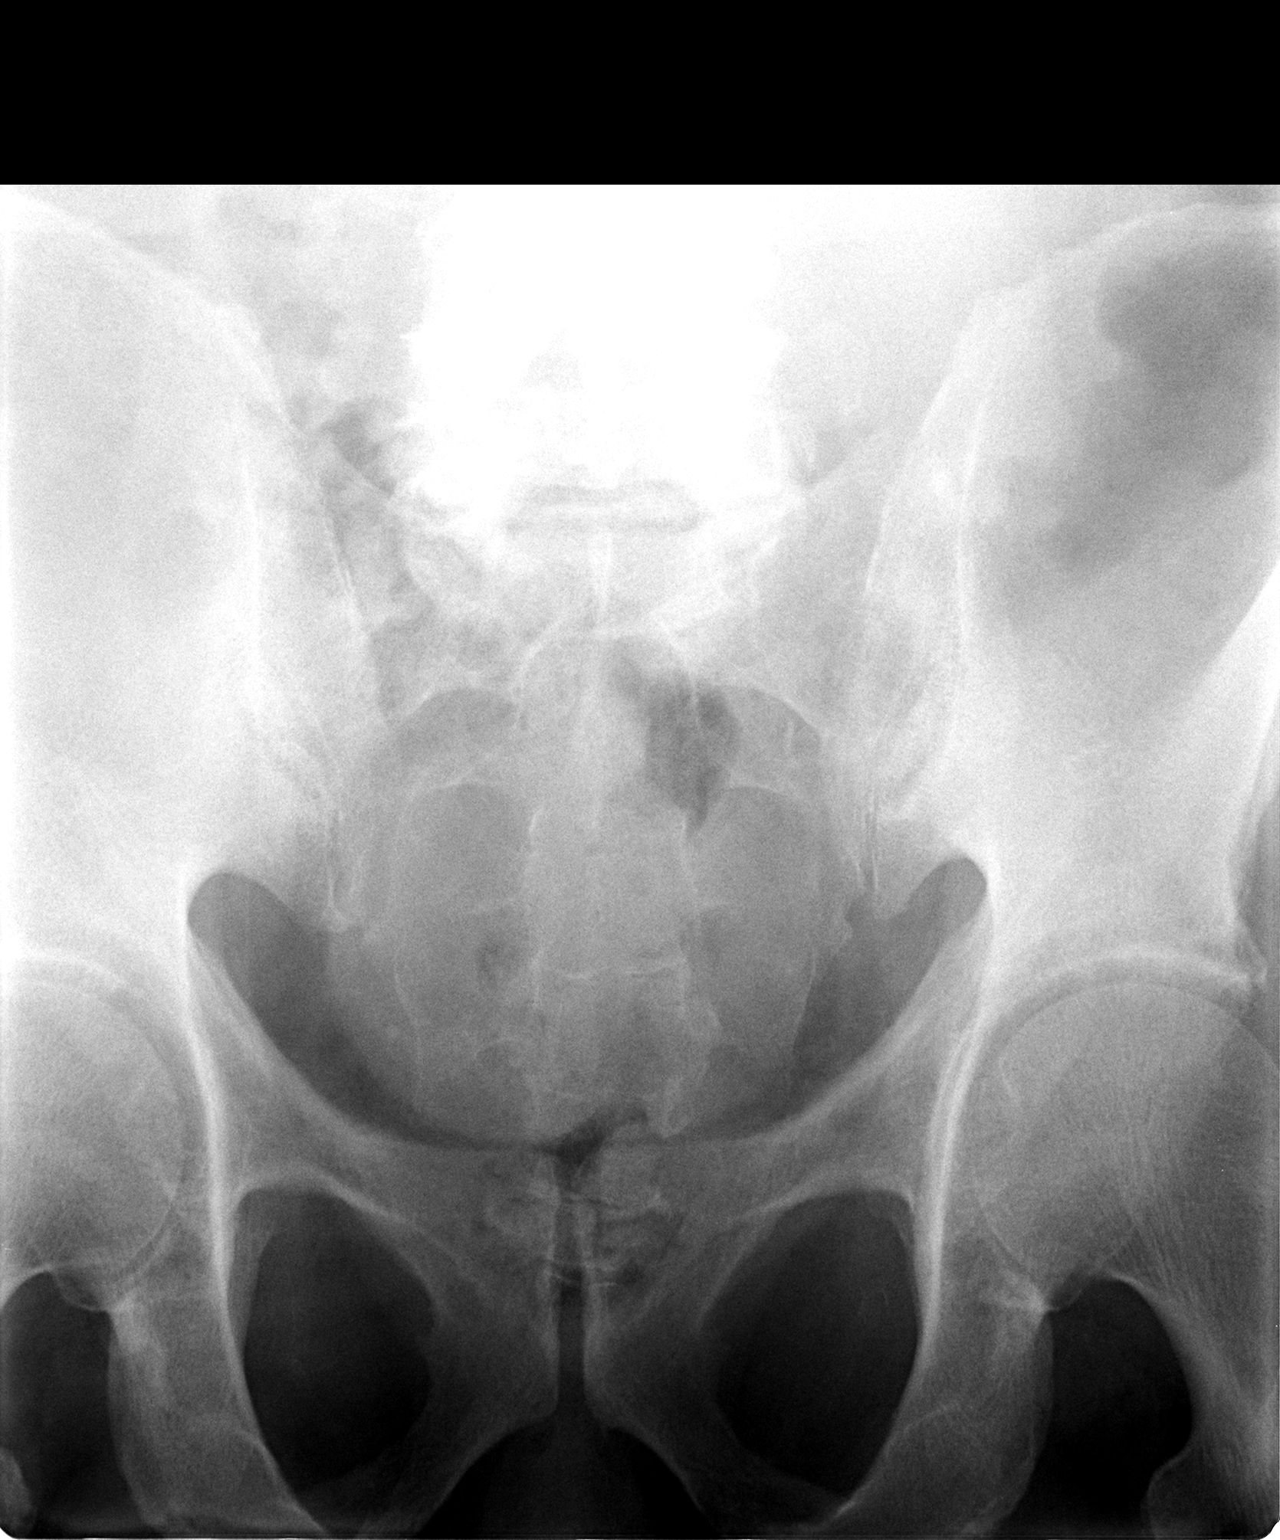

[view not recorded (2 of 3)]
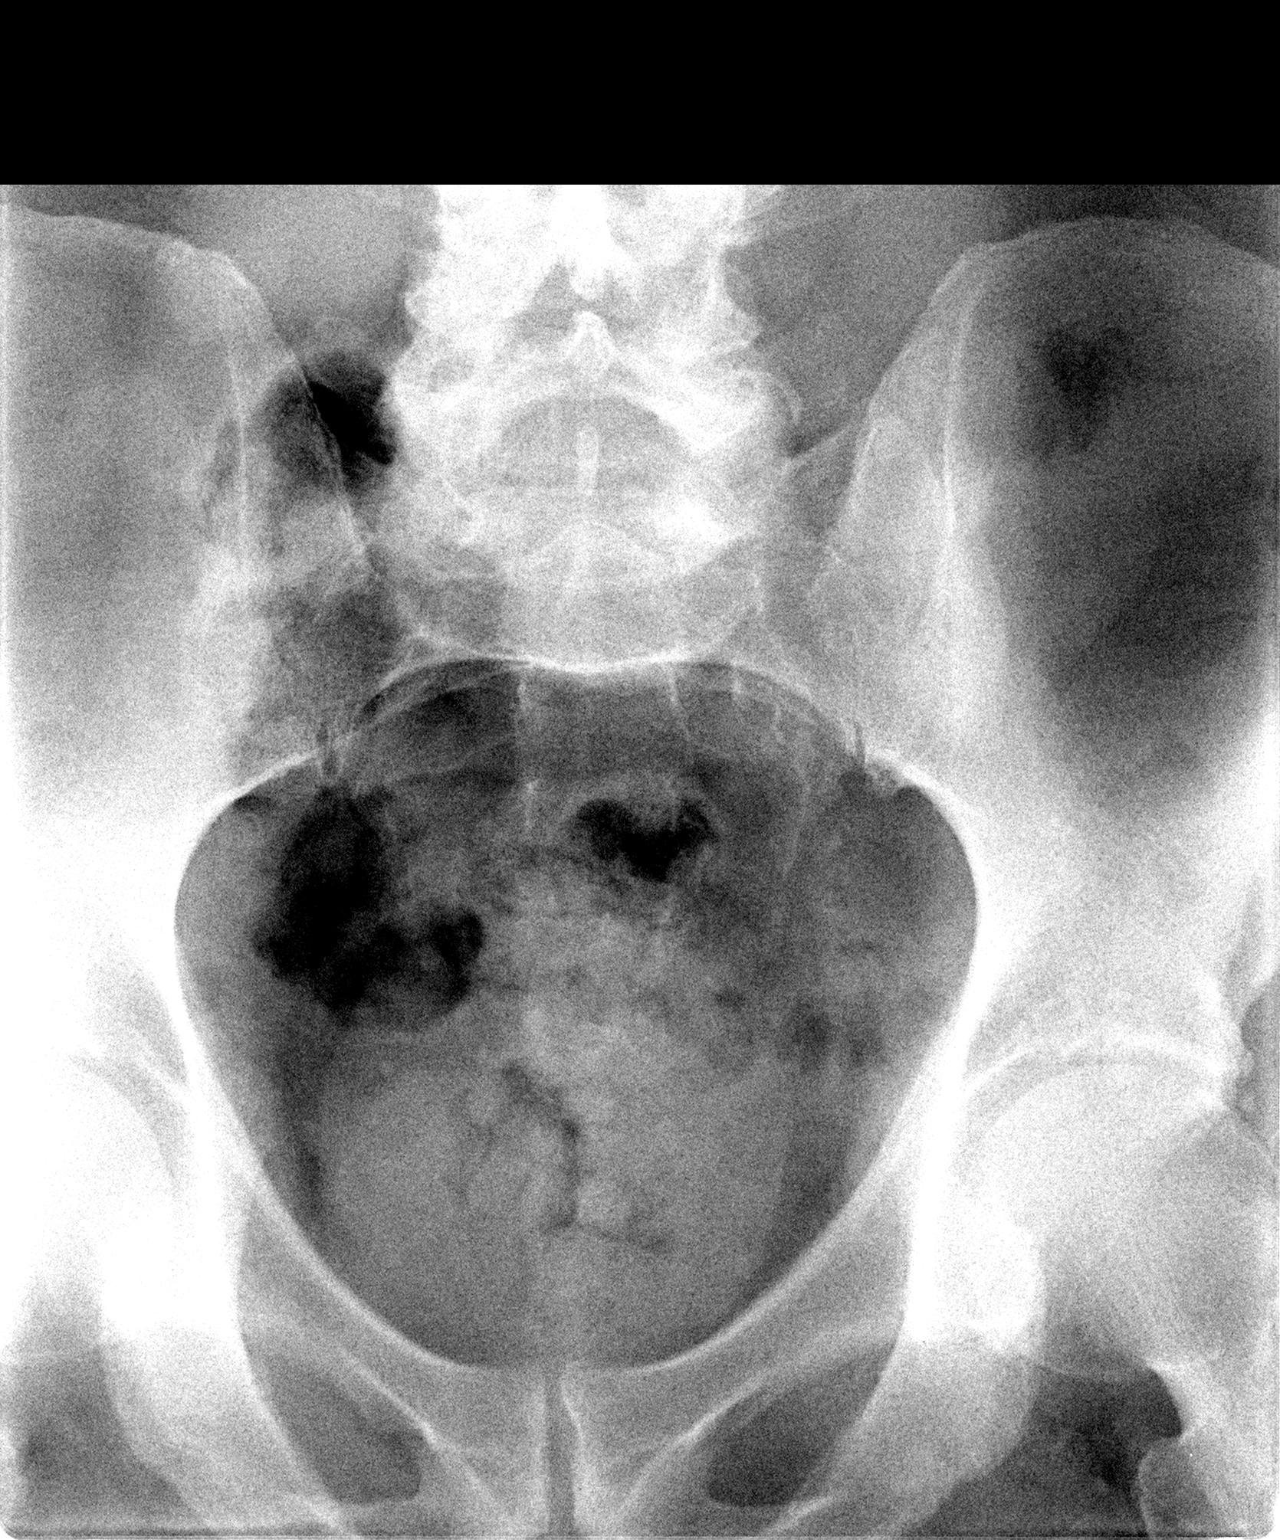

[view not recorded (3 of 3)]
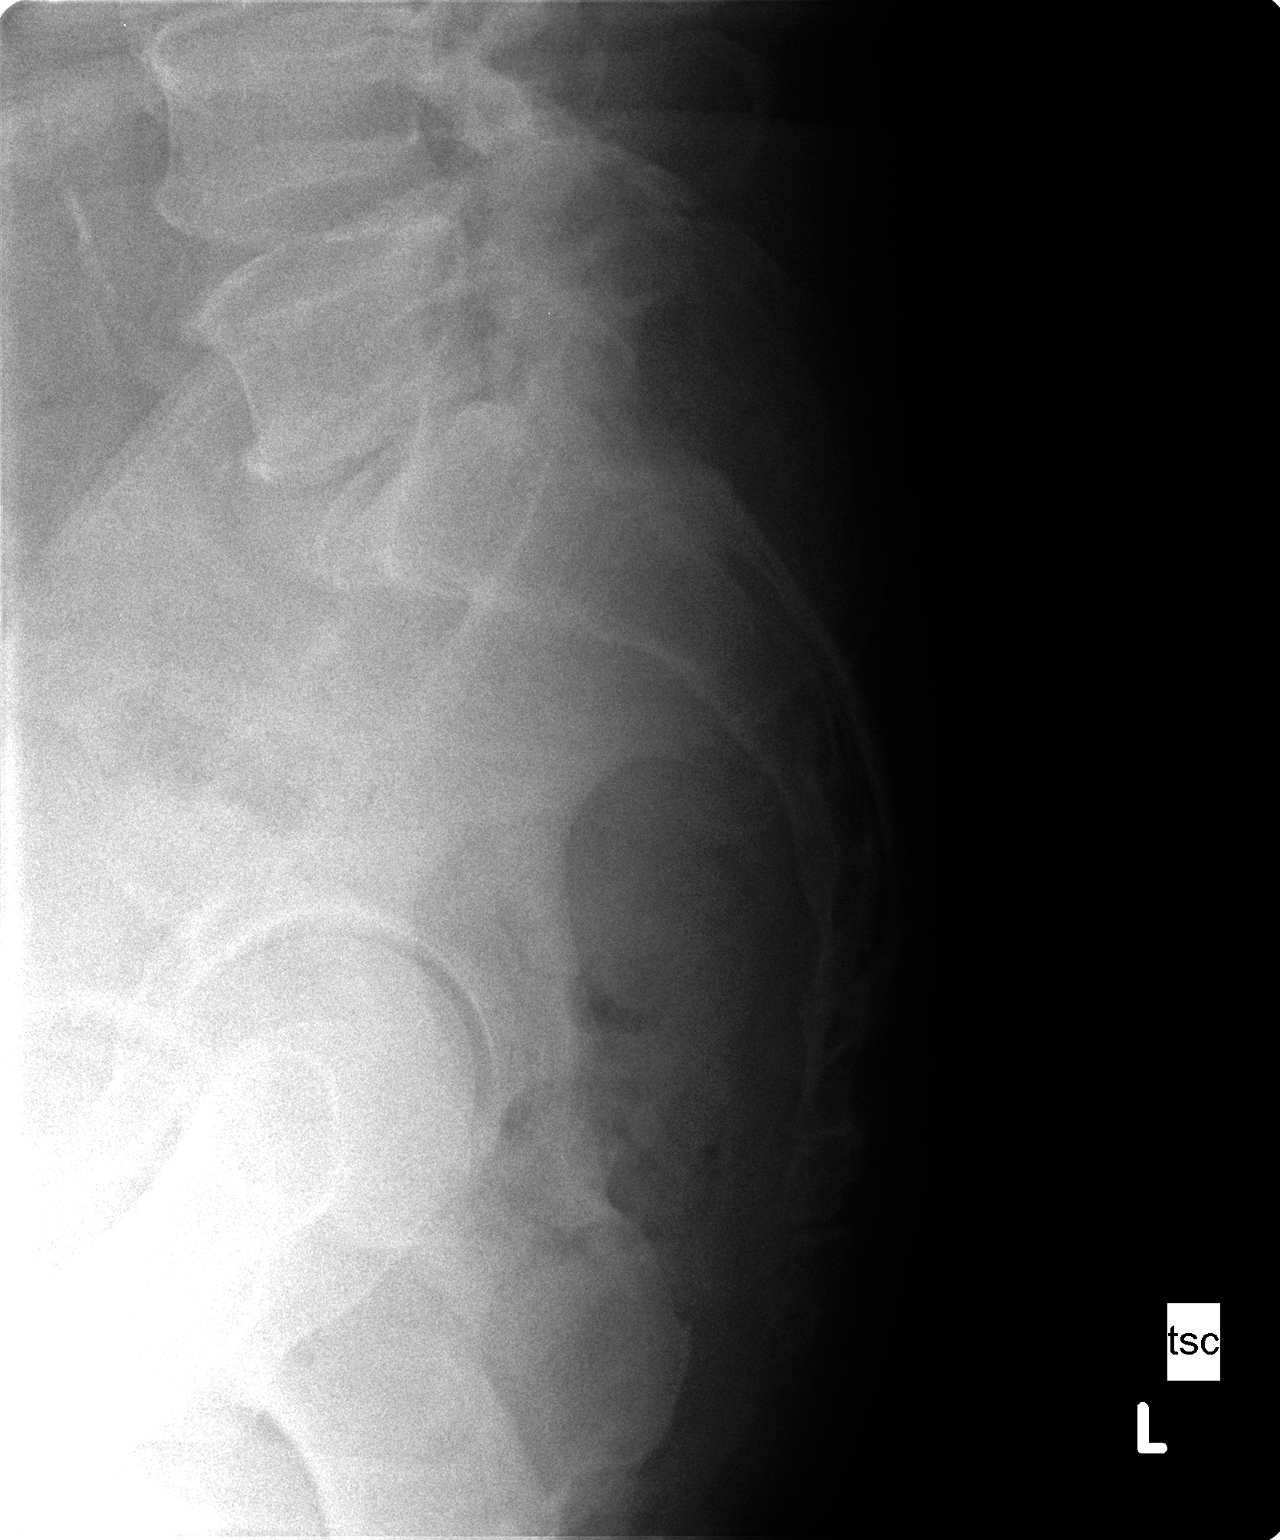

[3 of 3 positions shown; findings below may reference images not displayed]

FINDINGS: Degenerative changes noted lumbar sacral spine and both hips. No
acute bony abnormality identified..
IMPRESSION: 1. No acute abnormality. Degenerative changes lumbosacral spine and
both hips.

2. New Aortoiliac atherosclerotic vascular disease.

## 2017-03-18 ENCOUNTER — Ambulatory Visit (INDEPENDENT_AMBULATORY_CARE_PROVIDER_SITE_OTHER): Payer: Medicare Other | Admitting: Family Medicine

## 2017-03-18 ENCOUNTER — Encounter: Payer: Self-pay | Admitting: Family Medicine

## 2017-03-18 ENCOUNTER — Other Ambulatory Visit: Payer: Self-pay

## 2017-03-18 VITALS — BP 138/84 | HR 81 | Temp 98.0°F | Wt 238.2 lb

## 2017-03-18 DIAGNOSIS — E785 Hyperlipidemia, unspecified: Secondary | ICD-10-CM

## 2017-03-18 DIAGNOSIS — I10 Essential (primary) hypertension: Secondary | ICD-10-CM | POA: Diagnosis not present

## 2017-03-18 DIAGNOSIS — E1159 Type 2 diabetes mellitus with other circulatory complications: Secondary | ICD-10-CM | POA: Diagnosis not present

## 2017-03-18 DIAGNOSIS — B351 Tinea unguium: Secondary | ICD-10-CM | POA: Insufficient documentation

## 2017-03-18 LAB — LIPID PANEL
Cholesterol: 123 mg/dL (ref 0–200)
HDL: 46 mg/dL (ref >39.00)
LDL Cholesterol: 51 mg/dL (ref 0–99)
NONHDL: 77.35
Total CHOL/HDL Ratio: 3
Triglycerides: 132 mg/dL (ref 0.0–149.0)
VLDL: 26.4 mg/dL (ref 0.0–40.0)

## 2017-03-18 LAB — COMPREHENSIVE METABOLIC PANEL
ALT: 29 U/L (ref 0–53)
AST: 24 U/L (ref 0–37)
Albumin: 4.5 g/dL (ref 3.5–5.2)
Alkaline Phosphatase: 48 U/L (ref 39–117)
BUN: 12 mg/dL (ref 6–23)
CHLORIDE: 101 meq/L (ref 96–112)
CO2: 27 mEq/L (ref 19–32)
Calcium: 10.3 mg/dL (ref 8.4–10.5)
Creatinine, Ser: 0.95 mg/dL (ref 0.40–1.50)
GFR: 83.13 mL/min (ref >60.00)
GLUCOSE: 134 mg/dL — AB (ref 70–99)
POTASSIUM: 4.5 meq/L (ref 3.5–5.1)
SODIUM: 138 meq/L (ref 135–145)
TOTAL PROTEIN: 7.8 g/dL (ref 6.0–8.3)
Total Bilirubin: 1.3 mg/dL — ABNORMAL HIGH (ref 0.2–1.2)

## 2017-03-18 LAB — HEMOGLOBIN A1C: HEMOGLOBIN A1C: 7.4 % — AB (ref 4.6–6.5)

## 2017-03-18 NOTE — Progress Notes (Signed)
  [name], MD Phone: [phone]  Dakota Gonzalez is a 72 y.o. male who presents today for f/u.  HYPERTENSION Disease Monitoring: Blood pressure range-110-125/60-70  Chest pain- no      Dyspnea- no Medications: Compliance- taking amlodipine, lisinopril   Edema- no  DIABETES Disease Monitoring: Blood Sugar ranges-112-150 typically, excursions to 250, low of 80 Polyuria/phagia/dipsia- no      Optho- due for visit, patient will contact them to set this up Medications: Compliance- taking glimeperide, metformin Hypoglycemic symptoms- no  HYPERLIPIDEMIA Disease Monitoring: See symptoms for Hypertension Medications: Compliance- taking lipitor Right upper quadrant pain- no  Muscle aches- no  Onychomycosis: Patient notes thickened yellow toenails bilaterally left greater than right.  He has tried topical treatments in the past which have been beneficial.  This was over-the-counter.  He notes no pain with his toenails.    Social History   Tobacco Use  Smoking Status Former Smoker  Smokeless Tobacco Never Used     ROS see history of present illness  Objective  Physical Exam Vitals:   03/18/17 0926  BP: 138/84  Pulse: 81  Temp: 98 F (36.7 C)  SpO2: 98%    BP Readings from Last 3 Encounters:  03/18/17 138/84  12/17/16 (!) 144/80  10/18/16 (!) 148/88   Wt Readings from Last 3 Encounters:  03/18/17 238 lb 3.2 oz (108 kg)  12/17/16 235 lb 12.8 oz (107 kg)  10/18/16 233 lb 9.6 oz (106 kg)    Physical Exam  Constitutional: No distress.  Cardiovascular: Normal rate, regular rhythm and normal heart sounds.  Pulmonary/Chest: Effort normal and breath sounds normal.  Musculoskeletal: He exhibits no edema.  Neurological: He is alert. Gait normal.  Skin: Skin is warm and dry. He is not diaphoretic.   Diabetic Foot Exam - Simple   Simple Foot Form Diabetic Foot exam was performed with the following findings:  Yes 03/18/2017  9:54 AM  Visual Inspection See  comments:  Yes Sensation Testing Intact to touch and monofilament testing bilaterally:  Yes Pulse Check Posterior Tibialis and Dorsalis pulse intact bilaterally:  Yes Comments Thickened yellowed toenails all 5 toes left foot, fewer thickened toenails on right foot, no other deformities or ulcerations or skin breakdown      Assessment/Plan: Please see individual problem list.  HTN (hypertension) At goal at home.  Continue current regimen.  Check lab work today.  Diabetes mellitus type 2, controlled ([place]) Seems to be mostly well controlled.  We will check an A1c today.  Continue current regimen.  Hyperlipidemia Check lipid panel today.  Continue Lipitor.  Onychomycosis Responded well to over-the-counter topical treatment previously.  He will retry this.  If he would like to see podiatry he will let us know.   Orders Placed This Encounter  Procedures  . Comp Met (CMET)  . Lipid panel  . HgB A1c    No orders of the defined types were placed in this encounter.    [name], MD [place]

## 2017-03-18 NOTE — Assessment & Plan Note (Signed)
Seems to be mostly well controlled.  We will check an A1c today.  Continue current regimen.

## 2017-03-18 NOTE — Assessment & Plan Note (Signed)
Check lipid panel today.  Continue Lipitor.

## 2017-03-18 NOTE — Assessment & Plan Note (Signed)
Responded well to over-the-counter topical treatment previously.  He will retry this.  If he would like to see podiatry he will let us know.

## 2017-03-18 NOTE — Patient Instructions (Signed)
Nice to see you. We will get lab work today and contact you with the results. If you would like to see podiatry for your toenails please let us know. Please check on the tetanus vaccination with your pharmacy.

## 2017-03-18 NOTE — Assessment & Plan Note (Signed)
At goal at home.  Continue current regimen.  Check lab work today.

## 2017-03-19 ENCOUNTER — Encounter: Payer: Self-pay | Admitting: Family Medicine

## 2017-03-20 ENCOUNTER — Other Ambulatory Visit: Payer: Self-pay | Admitting: Family Medicine

## 2017-03-27 IMAGING — CT CT L SPINE W/O CM
3 of 9 series · 14 of 34 positions shown, 17 images · non-contrast
Comparison: CT abdomen and pelvis 11/07/2014.

CLINICAL DATA: Chronic low back pain which became severe 2 days
ago. History of fall October 2014. Initial encounter.

EXAM:
CT LUMBAR SPINE WITHOUT CONTRAST
TECHNIQUE: Multidetector CT imaging of the lumbar spine was performed without
intravenous contrast administration. Multiplanar CT image
reconstructions were also generated.

[Series 4: l spine soft · axial · 0.37mm/px · z∈[-778,-608]mm · 6 of 121 slices shown, 8 images]
[im 18/121  soft-tissue]
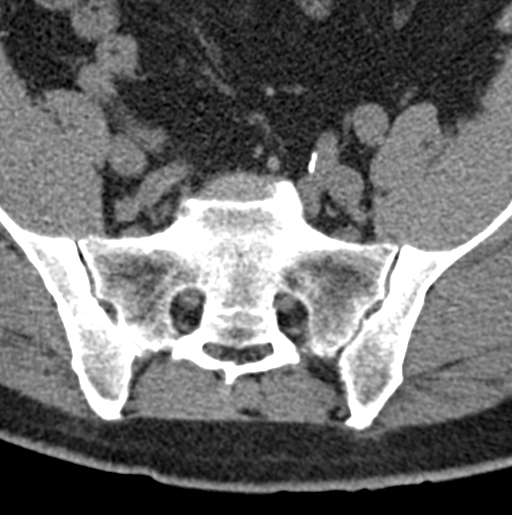
[im 18/121  bone]
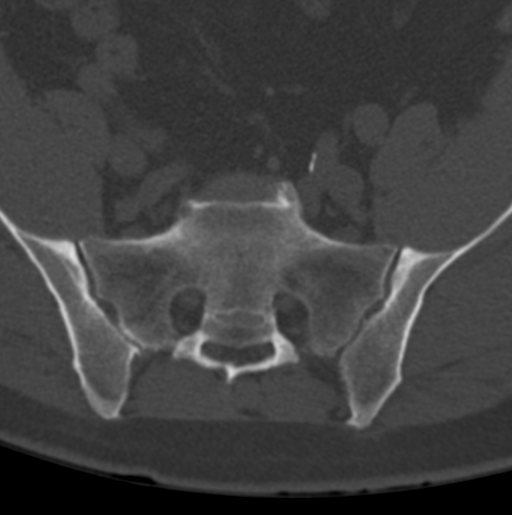
[im 35/121  bone]
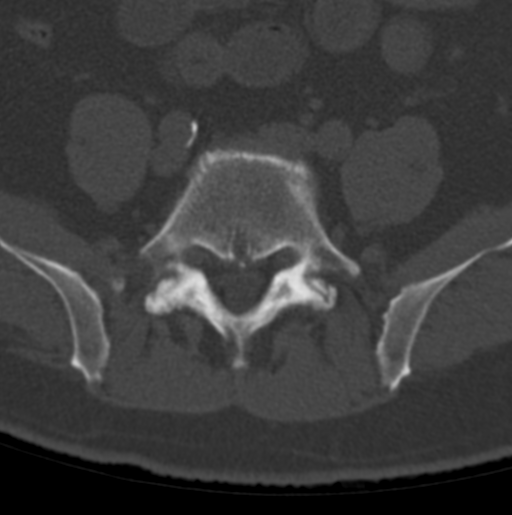
[im 52/121  bone]
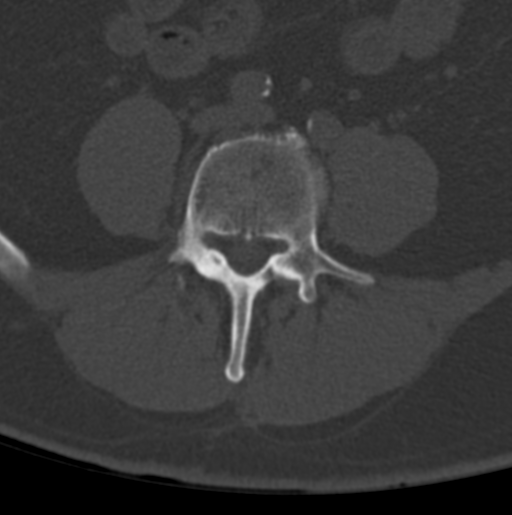
[im 69/121  bone]
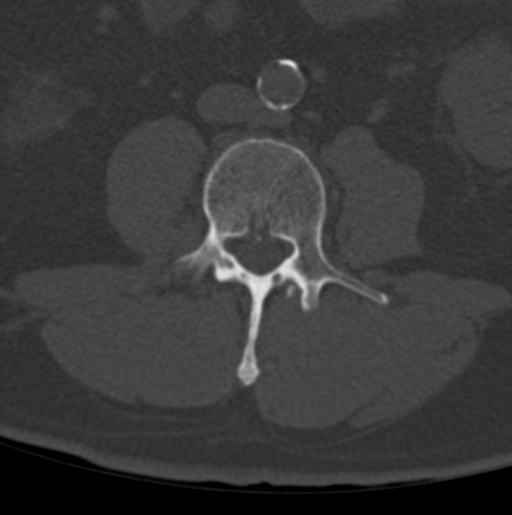
[im 86/121  soft-tissue]
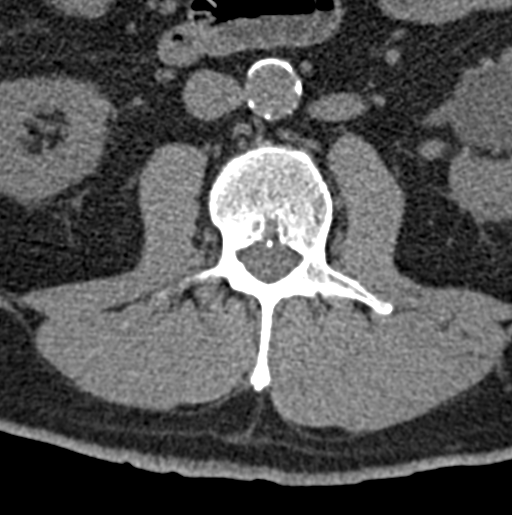
[im 86/121  bone]
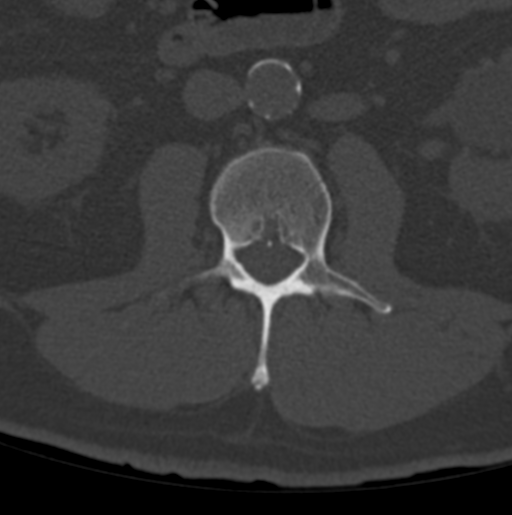
[im 103/121  bone]
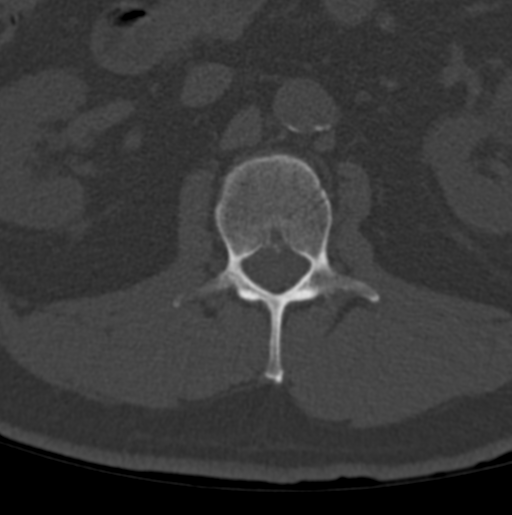

[Series 5: sagittal bone · sagittal · 0.38mm/px · 5 of 82 slices shown, 6 images]
[im 28/82  bone]
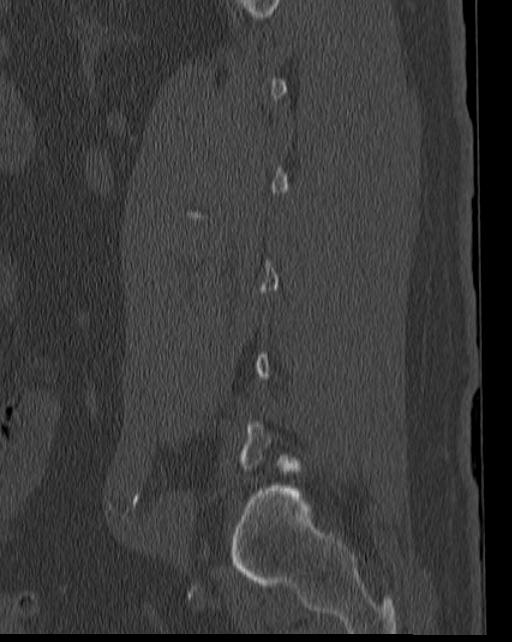
[im 34/82  bone]
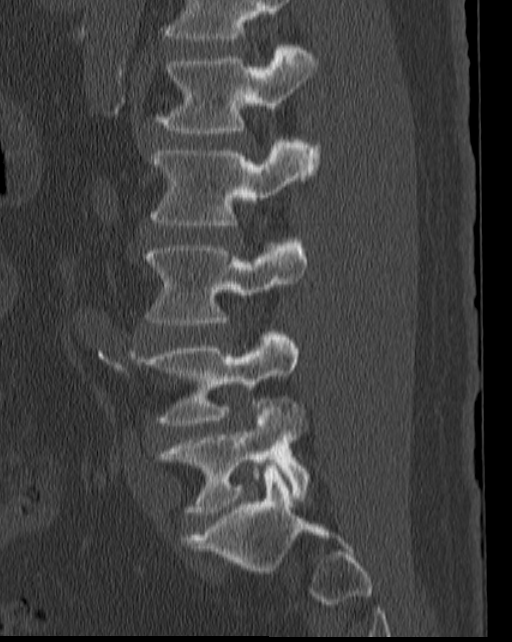
[im 41/82  soft-tissue]
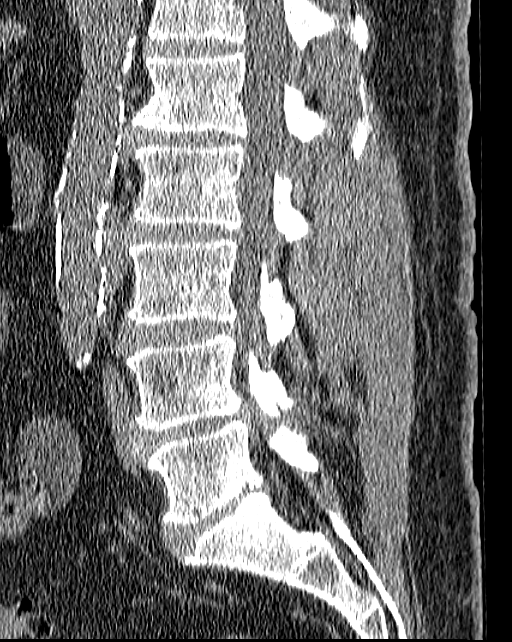
[im 41/82  bone]
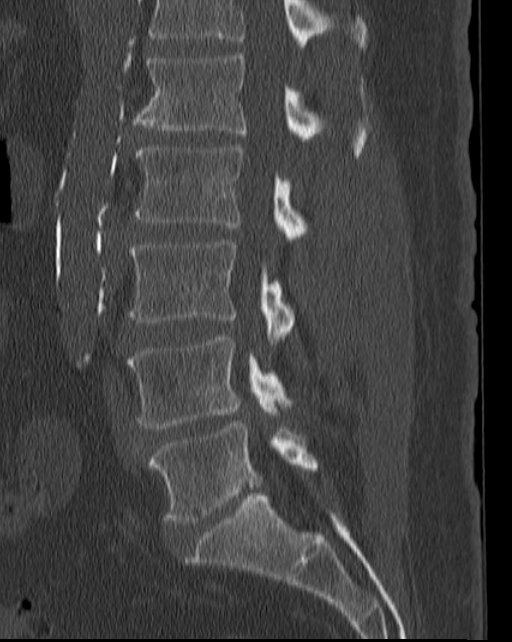
[im 48/82  bone]
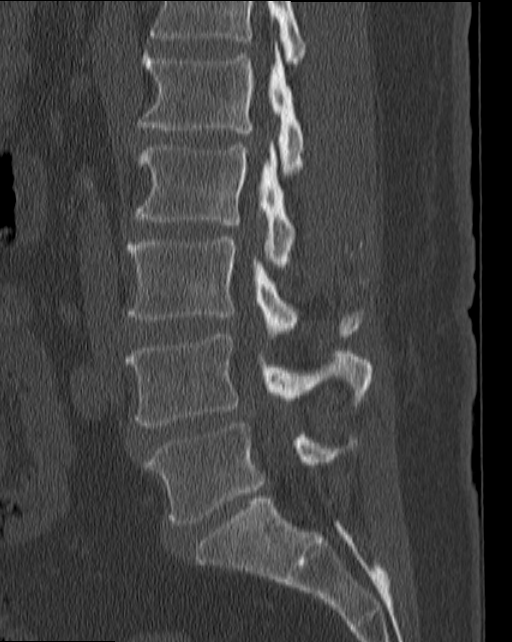
[im 55/82  bone]
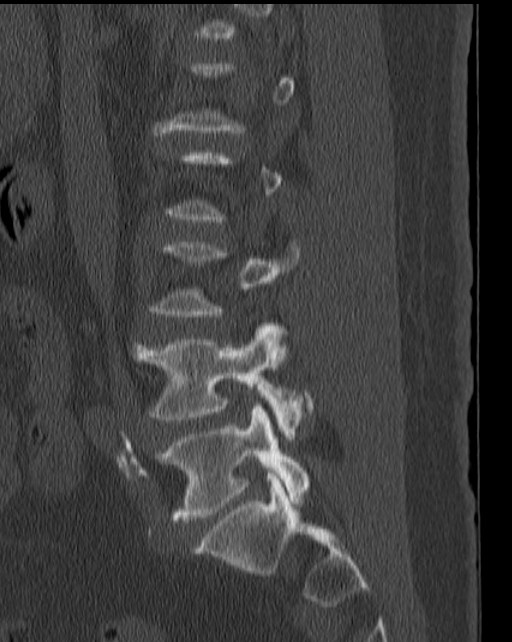

[Series 6: coronal bone · coronal · 0.40mm/px · 3 of 66 slices shown]
[im 14/66  bone]
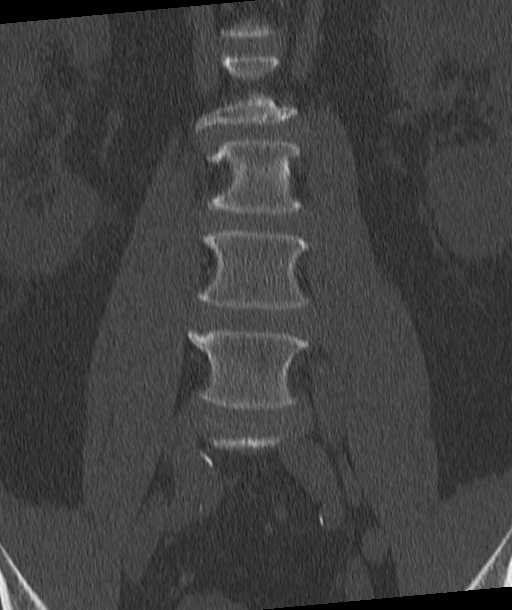
[im 27/66  bone]
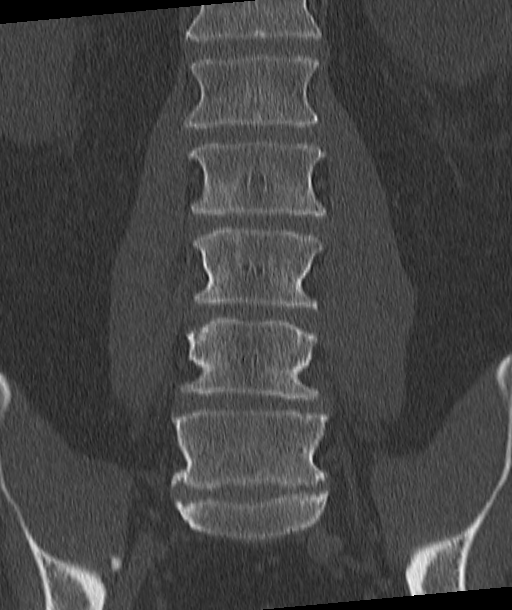
[im 40/66  bone]
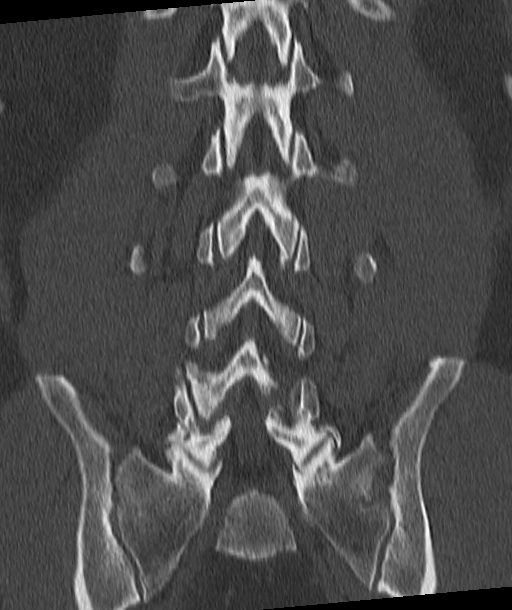

[14 of 34 positions shown; findings below may reference images not displayed]

FINDINGS: Vertebral body height and alignment are maintained. Aortoiliac
atherosclerosis without aneurysm is identified. There is partial
visualization of a cyst in the left kidney measuring approximately
5.7 cm in diameter. A second cyst in the lower pole of the left
kidney measures 3.2 cm in diameter. Retroaortic left renal vein is
noted. Imaged intra-abdominal contents are otherwise unremarkable.

T12-L1:  Mild facet degenerative disease.  Otherwise negative.

L1-2: There is a shallow disc bulge and endplate spur eccentric to
the right. There appears to be mild narrowing in the right lateral
recess. The foramina are open.

L2-3: Shallow broad-based disc bulge and mild facet degenerative
change are seen. The central canal and foramina appear open.

L3-4: Shallow disc bulge appears to cause mild central canal
narrowing. Mild to moderate foraminal narrowing appears worse on the
left.

L4-5: Advanced facet degenerative disease is seen on the right. The
patient appears to be status post partial resection of the left
facet joint for foraminotomy. There is a shallow broad-based disc
bulge. Mild to moderate central canal narrowing is identified.
Moderate to moderately severe right and mild to moderate left
foraminal narrowing is noted.

L5-S1: Moderate bilateral facet degenerative disease is present.
There is a shallow disc bulge with endplate spurring. The central
canal is open. Moderate to moderately severe bilateral foraminal
narrowing is identified.
IMPRESSION: No acute abnormality.

Multilevel mild to moderate spondylosis appearing most notable at
L4-5 and L5-S1 as described above.

Aortoiliac atherosclerosis without aneurysm.

## 2017-04-13 ENCOUNTER — Other Ambulatory Visit: Payer: Self-pay | Admitting: Family Medicine

## 2017-04-17 ENCOUNTER — Telehealth: Payer: Self-pay | Admitting: Family Medicine

## 2017-04-17 NOTE — Telephone Encounter (Signed)
Copied from [place]. Topic: Quick Communication - See Telephone Encounter >> Apr 17, 2017 10:26 AM Vernona Rieger wrote: CRM for notification. See Telephone encounter for: 04/17/17.  Patient said that he contacted the pharmacy to refill his metFORMIN (GLUCOPHAGE) 500 MG tablet and it keeps getting denied because he said that he is refilling it to early. He said that the reason is that Dr Biagio Quint doubled his dosage so he is running out sooner than he should. They asked him to see if he could just get a new script sent over to them. He only has 4 days left of this medicine.  [facility] [place], [place]

## 2017-04-18 MED ORDER — METFORMIN HCL 500 MG PO TABS: 1000.0000 mg | ORAL_TABLET | Freq: Two times a day (BID) | ORAL | 3 refills | Status: DC

## 2017-04-18 NOTE — Telephone Encounter (Signed)
Patient calling again, checking status. Please advise, patient request call once sent, [phone]

## 2017-04-18 NOTE — Telephone Encounter (Signed)
Please send in new rx with new instructions

## 2017-04-18 NOTE — Telephone Encounter (Signed)
See attached Metformin request.   Thanks.  Pt of Dr. Caryl Bis.

## 2017-04-18 NOTE — Telephone Encounter (Signed)
Sent to pharmacy 

## 2017-04-20 ENCOUNTER — Other Ambulatory Visit: Payer: Self-pay | Admitting: Family Medicine

## 2017-04-26 ENCOUNTER — Ambulatory Visit (INDEPENDENT_AMBULATORY_CARE_PROVIDER_SITE_OTHER): Payer: Medicare Other

## 2017-04-26 VITALS — BP 122/72 | HR 89 | Temp 98.4°F | Resp 15 | Ht 69.5 in | Wt 237.8 lb

## 2017-04-26 DIAGNOSIS — Z0000 Encounter for general adult medical examination without abnormal findings: Secondary | ICD-10-CM

## 2017-04-26 NOTE — Progress Notes (Signed)
Subjective:   Dakota Gonzalez is a 72 y.o. male who presents for Medicare Annual (Subsequent) preventive examination.  Review of Systems:  No ROS.  Medicare Wellness Visit. Additional risk factors are reflected in the social history.  Cardiac Risk Factors include: advanced age (>29mn, >>30women);hypertension;diabetes mellitus;male gender     Objective:     Vitals: BP 122/72 (BP Location: Left Arm, Patient Position: Sitting, Cuff Size: Normal)   Pulse 89   Temp 98.4 F (36.9 C) (Oral)   Resp 15   Ht 5' 9.5" (1.765 m)   Wt 237 lb 12.8 oz (107.9 kg)   SpO2 96%   BMI 34.61 kg/m   Body mass index is 34.61 kg/m.  Advanced Directives 04/26/2017 04/25/2016 11/28/2014  Does Patient Have a Medical Advance Directive? Yes No No  Type of [place] - -  Does patient want to make changes to medical advance directive? No - Patient declined - -  Copy of [place] Chart? No - copy requested - -  Would patient like information on creating a medical advance directive? - No - Patient declined -    Tobacco Social History   Tobacco Use  Smoking Status Former Smoker  Smokeless Tobacco Never Used     Counseling given: Not Answered   Clinical Intake:  Pre-visit preparation completed: Yes  Pain : No/denies pain     Nutritional Status: BMI 25 -29 Overweight Diabetes: Yes(Followed by PCP.)  How often do you need to have someone help you when you read instructions, pamphlets, or other written materials from your doctor or pharmacy?: 1 - Never  Interpreter Needed?: No     Past Medical History:  Diagnosis Date  . Allergy   . Arthritis   . Asthma   . Depression   . Diabetes mellitus 2007  . Enlarged prostate   . GERD (gastroesophageal reflux disease)   . Headache(784.0)   . Hyperlipidemia   . Hypertension   . Migraine   . Sinusitis    Past Surgical History:  Procedure Laterality Date  . BACK  SURGERY  10/2003   [facility] HEMANGIOMA EXCISION  2014   right upper arm, Dr. SRonalee Belts . PROSTATE BIOPSY  2014   Dr. CJacqlyn Larsen . TONSILLECTOMY AND ADENOIDECTOMY  1960   Family History  Problem Relation Age of Onset  . Stroke Mother   . Kidney disease Mother   . Diabetes Mother   . Cancer Father   . Stroke Father   . Kidney disease Father   . Cancer Other        colon, breast   Social History   Socioeconomic History  . Marital status: Widowed    Spouse name: Not on file  . Number of children: Not on file  . Years of education: Not on file  . Highest education level: Not on file  Occupational History  . Not on file  Social Needs  . Financial resource strain: Not hard at all  . Food insecurity:    Worry: Never true    Inability: Never true  . Transportation needs:    Medical: No    Non-medical: No  Tobacco Use  . Smoking status: Former SResearch scientist (life sciences) . Smokeless tobacco: Never Used  Substance and Sexual Activity  . Alcohol use: No  . Drug use: No  . Sexual activity: Never  Lifestyle  . Physical activity:  Days per week: Not on file    Minutes per session: Not on file  . Stress: Not at all  Relationships  . Social connections:    Talks on phone: Not on file    Gets together: Not on file    Attends religious service: Not on file    Active member of club or organization: Not on file    Attends meetings of clubs or organizations: Not on file    Relationship status: Not on file  Other Topics Concern  . Not on file  Social History Narrative   Lives in [place]. 1 son lives in [place].      Work - Disabled, previously Museum/gallery conservator and railroad.      Diet - regular   Exercise - ellipitical machine at home    Outpatient Encounter Medications as of 04/26/2017  Medication Sig  . amLODipine (NORVASC) 2.5 MG tablet Take 1 tablet (2.5 mg total) by mouth daily.  Marland Kitchen aspirin 81 MG tablet Take 81 mg by mouth daily.  Marland Kitchen atorvastatin  (LIPITOR) 40 MG tablet TAKE 1 TABLET(40 MG) BY MOUTH DAILY  . Blood Glucose Monitoring Suppl (ONE TOUCH ULTRA SYSTEM KIT) W/DEVICE KIT 1 kit by Does not apply route once.  . Coenzyme Q10 (COQ-10) 100 MG CAPS Take 1 tablet by mouth daily.  . cyclobenzaprine (FLEXERIL) 10 MG tablet Take 1 tablet (10 mg total) by mouth every 8 (eight) hours as needed for muscle spasms (do not drive while using, may make you drowsy).  . finasteride (PROSCAR) 5 MG tablet Take 5 mg by mouth daily.  Marland Kitchen glimepiride (AMARYL) 4 MG tablet TAKE 1 TABLET BY MOUTH TWICE DAILY  . glucose blood (ONE TOUCH ULTRA TEST) test strip USE TO TEST TWICE DAILY AS NEEDED  . lisinopril (PRINIVIL,ZESTRIL) 20 MG tablet Take 1 tablet (20 mg total) by mouth daily.  . metFORMIN (GLUCOPHAGE) 500 MG tablet Take 2 tablets (1,000 mg total) by mouth 2 (two) times daily with a meal.  . Multiple Vitamins-Minerals (CENTRUM SILVER PO) Take 1 tablet by mouth daily.  . ONE TOUCH LANCETS MISC Use as directed. One Touch Delica. Dx: 250.00  . S-Adenosylmethionine (SAM-E COMPLETE PO) Take by mouth.  . STOOL SOFTENER 100 MG capsule   . tamsulosin (FLOMAX) 0.4 MG CAPS capsule Take 1 capsule (0.4 mg total) by mouth daily.   No facility-administered encounter medications on file as of 04/26/2017.     Activities of Daily Living In your present state of health, do you have any difficulty performing the following activities: 04/26/2017  Hearing? N  Vision? N  Difficulty concentrating or making decisions? N  Walking or climbing stairs? N  Dressing or bathing? N  Doing errands, shopping? N  Preparing Food and eating ? N  Using the Toilet? N  In the past six months, have you accidently leaked urine? N  Comment Followed by Dr. Jacqlyn Larsen.  Do you have problems with loss of bowel control? N  Managing your Medications? N  Managing your Finances? N  Housekeeping or managing your Housekeeping? N  Some recent data might be hidden    Patient Care Team: [name], MD as PCP - General (Family Medicine) Wellington Hampshire, MD as Consulting Physician (Cardiology)    Assessment:   This is a routine wellness examination for Dakota Gonzalez.  The goal of the wellness visit is to assist the patient how to close the gaps in care and create a preventative care plan for the patient.  The roster of all physicians providing medical care to patient is listed in the Snapshot section of the chart.  Osteoporosis risk reviewed.    Safety issues reviewed; Smoke and carbon monoxide detectors in the home. No firearms in the home. Wears seatbelts when driving or riding with others. No violence in the home.  They do not have excessive sun exposure.  Discussed the need for sun protection: hats, long sleeves and the use of sunscreen if there is significant sun exposure.  Patient is alert, normal appearance, oriented to person/place/and time.  Correctly identified the president of the [place] and recalls of 1/3 words. Performs simple calculations and can read correct time from watch face. Displays appropriate judgement.  No new identified risk were noted.  No failures at ADL's or IADL's.    BMI- discussed the importance of a healthy diet, water intake and the benefits of aerobic exercise. Educational material provided.   24 hour diet recall: Diabetic diet  Dental- partial.  Eye- Visual acuity not assessed per patient preference since they have regular follow up with the ophthalmologist.  Wears corrective lenses.  Sleep patterns- Sleeps through the night without issues.    Patient Concerns: None at this time. Follow up with PCP as needed.  Exercise Activities and Dietary recommendations Current Exercise Habits: Home exercise routine, Intensity: Mild  Goals    . Maintain Healthy Lifestyle      Stay hydrated Exercise  Low carb foods       Fall Risk Fall Risk  04/26/2017 04/25/2016 08/17/2015 06/24/2015 12/15/2014  Falls in the past year? No No No No No    Depression Screen PHQ 2/9 Scores 04/26/2017 04/25/2016 08/17/2015 06/24/2015  PHQ - 2 Score 0 1 0 0     Cognitive Function MMSE - Mini Mental State Exam 04/25/2016  Orientation to time 5  Orientation to Place 5  Registration 3  Attention/ Calculation 5  Recall 3  Language- name 2 objects 2  Language- repeat 1  Language- follow 3 step command 3  Language- read & follow direction 1  Write a sentence 1  Copy design 1  Total score 30     6CIT Screen 04/26/2017  What Year? 0 points  What month? 0 points  What time? 0 points  Count back from 20 0 points  Months in reverse 0 points  Repeat phrase 0 points  Total Score 0    Immunization History  Administered Date(s) Administered  . Influenza Split 10/17/2011  . Influenza,inj,Quad PF,6+ Mos 09/22/2012, 10/10/2013, 11/10/2014  . Pneumococcal Conjugate-13 06/12/2013  . Pneumococcal Polysaccharide-23 10/17/2011   Screening Tests Health Maintenance  Topic Date Due  . OPHTHALMOLOGY EXAM  02/14/2017  . INFLUENZA VACCINE  10/18/2017 (Originally 08/15/2017)  . Fecal DNA (Cologuard)  08/24/2017  . HEMOGLOBIN A1C  09/18/2017  . FOOT EXAM  03/19/2018  . TETANUS/TDAP  03/28/2027  . Hepatitis C Screening  Completed  . PNA vac Low Risk Adult  Completed      Plan:    End of life planning; Advance aging; Advanced directives discussed. Copy of current HCPOA/Living Will requested.    I have personally reviewed and noted the following in the patient's chart:   . Medical and social history . Use of alcohol, tobacco or illicit drugs  . Current medications and supplements . Functional ability and status . Nutritional status . Physical activity . Advanced directives . List of other physicians . Hospitalizations, surgeries, and ER visits in previous 12 months . Vitals . Screenings  to include cognitive, depression, and falls . Referrals and appointments  In addition, I have reviewed and discussed with patient certain preventive  protocols, quality metrics, and best practice recommendations. A written personalized care plan for preventive services as well as general preventive health recommendations were provided to patient.     Varney Biles, LPN  08/24/9831

## 2017-04-26 NOTE — Patient Instructions (Addendum)
  Mr. Moede , Thank you for taking time to come for your Medicare Wellness Visit. I appreciate your ongoing commitment to your health goals. Please review the following plan we discussed and let me know if I can assist you in the future.   Follow up as needed.    Bring a copy of your [place] and/or Living Will to be scanned into chart.  Have a great day!  These are the goals we discussed: Goals    . Maintain Healthy Lifestyle      Stay hydrated Exercise  Low carb foods       This is a list of the screening recommended for you and due dates:  Health Maintenance  Topic Date Due  . Eye exam for diabetics  02/14/2017  . Flu Shot  10/18/2017*  . Cologuard (Stool DNA test)  08/24/2017  . Hemoglobin A1C  09/18/2017  . Complete foot exam   03/19/2018  . Tetanus Vaccine  03/28/2027  .  Hepatitis C: One time screening is recommended by Center for Disease Control  (CDC) for  adults born from [birth year] through 1964.   Completed  . Pneumonia vaccines  Completed  *Topic was postponed. The date shown is not the original due date.

## 2017-04-29 ENCOUNTER — Other Ambulatory Visit: Payer: Self-pay | Admitting: Family Medicine

## 2017-04-30 DIAGNOSIS — E119 Type 2 diabetes mellitus without complications: Secondary | ICD-10-CM | POA: Diagnosis not present

## 2017-04-30 LAB — HM DIABETES EYE EXAM

## 2017-05-29 ENCOUNTER — Encounter: Payer: Self-pay | Admitting: Family Medicine

## 2017-06-11 ENCOUNTER — Other Ambulatory Visit: Payer: Self-pay | Admitting: Family Medicine

## 2017-06-20 ENCOUNTER — Ambulatory Visit (INDEPENDENT_AMBULATORY_CARE_PROVIDER_SITE_OTHER): Payer: Medicare Other | Admitting: Family Medicine

## 2017-06-20 ENCOUNTER — Encounter: Payer: Self-pay | Admitting: Family Medicine

## 2017-06-20 DIAGNOSIS — I10 Essential (primary) hypertension: Secondary | ICD-10-CM

## 2017-06-20 DIAGNOSIS — E1159 Type 2 diabetes mellitus with other circulatory complications: Secondary | ICD-10-CM

## 2017-06-20 DIAGNOSIS — R972 Elevated prostate specific antigen [PSA]: Secondary | ICD-10-CM | POA: Diagnosis not present

## 2017-06-20 DIAGNOSIS — N401 Enlarged prostate with lower urinary tract symptoms: Secondary | ICD-10-CM

## 2017-06-20 DIAGNOSIS — R351 Nocturia: Secondary | ICD-10-CM

## 2017-06-20 DIAGNOSIS — N400 Enlarged prostate without lower urinary tract symptoms: Secondary | ICD-10-CM | POA: Insufficient documentation

## 2017-06-20 LAB — PSA: PSA: 1.59 ng/mL (ref 0.10–4.00)

## 2017-06-20 LAB — HEMOGLOBIN A1C: HEMOGLOBIN A1C: 6.8 % — AB (ref 4.6–6.5)

## 2017-06-20 NOTE — Assessment & Plan Note (Signed)
Controlled at home.  Continue current regimen.

## 2017-06-20 NOTE — Patient Instructions (Signed)
Nice to see you. We will check lab work today and contact you with the results. Please continue to work on diet and exercise. Please make sure you keep your appointment with urology.

## 2017-06-20 NOTE — Progress Notes (Signed)
  [name], MD Phone: [phone]  Dakota Gonzalez is a 72 y.o. male who presents today for f/u.  CC: dm, htn, BPH  DIABETES Disease Monitoring: Blood Sugar ranges-avg 110-120, excursion to 170, low of 60 in march Polyuria/phagia/dipsia- no      Optho- no Medications: Compliance- taking glimeperide, metformin Hypoglycemic symptoms- yes, in march in the afternoons, none since He has been staying more active and also not snacking.  HYPERTENSION  Disease Monitoring  Home BP Monitoring 120-130/65-75 Chest pain- no    Dyspnea- no Medications  Compliance-  Taking amlodipine, lisinopril.  Edema- no  BPH: Strain- no Flow- good Frequency- no Urgency- occasional Nocturia- 1x Dysuria- no Emptying bladder- yes Medication- flomax, proscar, follows with urology Needs PSA check prior to urology appointment in July  Social History   Tobacco Use  Smoking Status Former Smoker  Smokeless Tobacco Never Used     ROS see history of present illness  Objective  Physical Exam Vitals:   06/20/17 0919  BP: 138/80  Pulse: 98  Temp: 98.1 F (36.7 C)  SpO2: 97%    BP Readings from Last 3 Encounters:  06/20/17 138/80  04/26/17 122/72  03/18/17 138/84   Wt Readings from Last 3 Encounters:  06/20/17 234 lb 3.2 oz (106.2 kg)  04/26/17 237 lb 12.8 oz (107.9 kg)  03/18/17 238 lb 3.2 oz (108 kg)    Physical Exam  Constitutional: No distress.  Cardiovascular: Normal rate, regular rhythm and normal heart sounds.  Pulmonary/Chest: Effort normal and breath sounds normal.  Musculoskeletal: He exhibits no edema.  Neurological: He is alert.  Skin: Skin is warm and dry. He is not diaphoretic.     Assessment/Plan: Please see individual problem list.  HTN (hypertension) Controlled at home.  Continue current regimen.  Diabetes mellitus type 2, controlled ([place]) Improved control.  Low as previously though none recently.  Will check A1c today.  Continue to work on diet and  exercise  Elevated PSA History of this followed by urology.  Recent PSA appears to have been in the normal range.  Due for recheck.  BPH (benign prostatic hyperplasia) Relatively well controlled.  He will continue to see urology.    Orders Placed This Encounter  Procedures  . PSA  . HgB A1c    No orders of the defined types were placed in this encounter.    [name], MD [place]

## 2017-06-20 NOTE — Assessment & Plan Note (Signed)
Relatively well controlled.  He will continue to see urology.

## 2017-06-20 NOTE — Assessment & Plan Note (Signed)
History of this followed by urology.  Recent PSA appears to have been in the normal range.  Due for recheck.

## 2017-06-20 NOTE — Assessment & Plan Note (Signed)
Improved control.  Low as previously though none recently.  Will check A1c today.  Continue to work on diet and exercise

## 2017-07-22 ENCOUNTER — Encounter: Payer: Self-pay | Admitting: Family Medicine

## 2017-07-22 ENCOUNTER — Other Ambulatory Visit: Payer: Self-pay | Admitting: Family Medicine

## 2017-07-23 MED ORDER — BLOOD GLUCOSE METER KIT: PACK | 0 refills | Status: DC

## 2017-07-25 DIAGNOSIS — E7800 Pure hypercholesterolemia, unspecified: Secondary | ICD-10-CM | POA: Diagnosis not present

## 2017-07-25 DIAGNOSIS — E119 Type 2 diabetes mellitus without complications: Secondary | ICD-10-CM | POA: Diagnosis not present

## 2017-07-25 DIAGNOSIS — R339 Retention of urine, unspecified: Secondary | ICD-10-CM | POA: Diagnosis not present

## 2017-07-25 DIAGNOSIS — I10 Essential (primary) hypertension: Secondary | ICD-10-CM | POA: Diagnosis not present

## 2017-08-03 ENCOUNTER — Other Ambulatory Visit: Payer: Self-pay | Admitting: Family Medicine

## 2017-08-05 ENCOUNTER — Other Ambulatory Visit: Payer: Self-pay | Admitting: Family Medicine

## 2017-08-09 ENCOUNTER — Other Ambulatory Visit: Payer: Self-pay | Admitting: Family Medicine

## 2017-09-18 ENCOUNTER — Ambulatory Visit: Payer: Self-pay | Admitting: Family Medicine

## 2017-10-09 ENCOUNTER — Encounter: Payer: Self-pay | Admitting: Family Medicine

## 2017-10-09 ENCOUNTER — Ambulatory Visit (INDEPENDENT_AMBULATORY_CARE_PROVIDER_SITE_OTHER): Payer: Medicare Other | Admitting: Family Medicine

## 2017-10-09 VITALS — BP 160/80 | HR 88 | Temp 98.3°F | Ht 70.0 in | Wt 232.6 lb

## 2017-10-09 DIAGNOSIS — E785 Hyperlipidemia, unspecified: Secondary | ICD-10-CM | POA: Diagnosis not present

## 2017-10-09 DIAGNOSIS — D3502 Benign neoplasm of left adrenal gland: Secondary | ICD-10-CM | POA: Insufficient documentation

## 2017-10-09 DIAGNOSIS — I10 Essential (primary) hypertension: Secondary | ICD-10-CM

## 2017-10-09 DIAGNOSIS — Z1211 Encounter for screening for malignant neoplasm of colon: Secondary | ICD-10-CM

## 2017-10-09 DIAGNOSIS — E1159 Type 2 diabetes mellitus with other circulatory complications: Secondary | ICD-10-CM

## 2017-10-09 DIAGNOSIS — E279 Disorder of adrenal gland, unspecified: Secondary | ICD-10-CM | POA: Diagnosis not present

## 2017-10-09 DIAGNOSIS — E278 Other specified disorders of adrenal gland: Secondary | ICD-10-CM

## 2017-10-09 DIAGNOSIS — N400 Enlarged prostate without lower urinary tract symptoms: Secondary | ICD-10-CM

## 2017-10-09 LAB — COMPREHENSIVE METABOLIC PANEL
ALT: 27 U/L (ref 0–53)
AST: 20 U/L (ref 0–37)
Albumin: 4.7 g/dL (ref 3.5–5.2)
Alkaline Phosphatase: 50 U/L (ref 39–117)
BUN: 11 mg/dL (ref 6–23)
CHLORIDE: 104 meq/L (ref 96–112)
CO2: 27 meq/L (ref 19–32)
Calcium: 10 mg/dL (ref 8.4–10.5)
Creatinine, Ser: 0.94 mg/dL (ref 0.40–1.50)
GFR: 84.02 mL/min (ref >60.00)
Glucose, Bld: 136 mg/dL — ABNORMAL HIGH (ref 70–99)
POTASSIUM: 4.6 meq/L (ref 3.5–5.1)
SODIUM: 139 meq/L (ref 135–145)
Total Bilirubin: 1.3 mg/dL — ABNORMAL HIGH (ref 0.2–1.2)
Total Protein: 7.7 g/dL (ref 6.0–8.3)

## 2017-10-09 LAB — HEMOGLOBIN A1C: Hgb A1c MFr Bld: 6.9 % — ABNORMAL HIGH (ref 4.6–6.5)

## 2017-10-09 NOTE — Assessment & Plan Note (Signed)
Well-controlled.  Continue current regimen. 

## 2017-10-09 NOTE — Patient Instructions (Addendum)
Nice to see you.  We will check lab work today and contact you with the results. We will get you set up for Cologuard. We will get you set up for a CT scan to follow-up on the adrenal gland tumor that was previously seen. Please start checking your blood pressure at home daily.  We will have you return in 2 weeks for BP check with nursing.

## 2017-10-09 NOTE — Progress Notes (Signed)
  [name], MD Phone: [phone]  Dakota Gonzalez is a 72 y.o. male who presents today for f/u.  CC: htn, dm, hld, BPH  HYPERTENSION Disease Monitoring: Blood pressure range-not checking Chest pain- no      Dyspnea- no Medications: Compliance- taking amlodipine, lisinopril   Edema- no  DIABETES Disease Monitoring: Blood Sugar ranges-90-120 Polyuria/phagia/dipsia- no      Optho- no Medications: Compliance- taking glimeperide, metformin Hypoglycemic symptoms- no  HYPERLIPIDEMIA Disease Monitoring: See symptoms for Hypertension Medications: Compliance- taking lipitor Right upper quadrant pain- no  Muscle aches- no  BPH: He did see urology and he reports they noted he only needed to follow-up as needed.  He is on Proscar and Flomax.  No issues emptying his bladder.  No straining.  No starting and stopping.  On review of prior CT abdomen and pelvis from 2013 it appears that there was a left adrenal nodule noted.  He does not appear to have follow-up imaging.  This will need to be completed.   Social History   Tobacco Use  Smoking Status Former Smoker  Smokeless Tobacco Never Used     ROS see history of present illness  Objective  Physical Exam Vitals:   10/09/17 0955  BP: (!) 160/80  Pulse: 88  Temp: 98.3 F (36.8 C)  SpO2: 97%    BP Readings from Last 3 Encounters:  10/09/17 (!) 160/80  06/20/17 138/80  04/26/17 122/72   Wt Readings from Last 3 Encounters:  10/09/17 232 lb 9.6 oz (105.5 kg)  06/20/17 234 lb 3.2 oz (106.2 kg)  04/26/17 237 lb 12.8 oz (107.9 kg)    Physical Exam  Constitutional: No distress.  Cardiovascular: Normal rate, regular rhythm and normal heart sounds.  Pulmonary/Chest: Effort normal and breath sounds normal.  Musculoskeletal: He exhibits no edema.  Neurological: He is alert.  Skin: Skin is warm and dry. He is not diaphoretic.     Assessment/Plan: Please see individual problem list.  HTN (hypertension) Elevated  though had been well controlled.  We will have him check at home and return for BP check with nursing.  Continue current regimen.  Diabetes mellitus type 2, controlled ([place]) Seems to be adequately controlled.  Check A1c.  Continue current regimen.  BPH (benign prostatic hyperplasia) Well-controlled.  Continue current regimen.  Hyperlipidemia Continue Lipitor.  Adrenal mass, left ([place]) Noted on review of prior imaging.  Will get follow-up CT scan arranged to see if this is still present.   Orders Placed This Encounter  Procedures  . CT ABDOMEN WO CONTRAST    Adrenal protocol    Standing Status:   Future    Standing Expiration Date:   01/09/2019    Order Specific Question:   Preferred imaging location?    Answer:   [facility]    Order Specific Question:   Is Oral Contrast requested for this exam?    Answer:   Yes, Per Radiology protocol    Order Specific Question:   Radiology Contrast Protocol - do NOT remove file path    Answer:   [email or web address]  . Cologuard  . HgB A1c  . Comp Met (CMET)    No orders of the defined types were placed in this encounter.    [name], MD [place]

## 2017-10-09 NOTE — Assessment & Plan Note (Signed)
-  Continue Lipitor °

## 2017-10-09 NOTE — Assessment & Plan Note (Signed)
Elevated though had been well controlled.  We will have him check at home and return for BP check with nursing.  Continue current regimen.

## 2017-10-09 NOTE — Assessment & Plan Note (Signed)
Seems to be adequately controlled.  Check A1c.  Continue current regimen.

## 2017-10-09 NOTE — Assessment & Plan Note (Signed)
Noted on review of prior imaging.  Will get follow-up CT scan arranged to see if this is still present.

## 2017-10-18 ENCOUNTER — Ambulatory Visit
Admission: RE | Admit: 2017-10-18 | Discharge: 2017-10-18 | Disposition: A | Discharge status: Home / Self Care | Payer: Medicare Other | Source: Ambulatory Visit | Attending: Family Medicine | Admitting: Family Medicine

## 2017-10-18 DIAGNOSIS — K7689 Other specified diseases of liver: Secondary | ICD-10-CM | POA: Insufficient documentation

## 2017-10-18 DIAGNOSIS — E278 Other specified disorders of adrenal gland: Secondary | ICD-10-CM | POA: Diagnosis not present

## 2017-10-18 DIAGNOSIS — D3502 Benign neoplasm of left adrenal gland: Secondary | ICD-10-CM | POA: Diagnosis not present

## 2017-10-18 DIAGNOSIS — N200 Calculus of kidney: Secondary | ICD-10-CM | POA: Insufficient documentation

## 2017-10-18 DIAGNOSIS — I700 Atherosclerosis of aorta: Secondary | ICD-10-CM | POA: Diagnosis not present

## 2017-10-19 ENCOUNTER — Other Ambulatory Visit: Payer: Self-pay | Admitting: Family Medicine

## 2017-10-24 ENCOUNTER — Ambulatory Visit: Payer: Medicare Other

## 2017-10-24 DIAGNOSIS — Z0130 Encounter for examination of blood pressure without abnormal findings: Secondary | ICD-10-CM

## 2017-10-24 NOTE — Progress Notes (Signed)
Pt is here today for a BP check. Pt did bring in a copy of his BP readings from home.   Pt is still using [facility] pharmacy that I listed in his chart. No changes with medication list.   RA:160/90 O2: 97, P:93  LA: 162/100 O2:98, P:94  Pt stated that he did take his BP medication this morning. Pt stated that he feels fine even though his BP elevated. Pt's BP reading at home look much better. Placed for PCP to review.   Sent to PCP

## 2017-10-28 ENCOUNTER — Telehealth: Payer: Self-pay | Admitting: Family Medicine

## 2017-10-28 DIAGNOSIS — E278 Other specified disorders of adrenal gland: Secondary | ICD-10-CM

## 2017-10-28 NOTE — Telephone Encounter (Signed)
Patient needs lab order and dexamethasone called to pharmacy for Cortisol test .

## 2017-10-29 ENCOUNTER — Telehealth: Payer: Self-pay | Admitting: Radiology

## 2017-10-29 ENCOUNTER — Other Ambulatory Visit: Payer: Self-pay

## 2017-10-29 MED ORDER — DEXAMETHASONE 1 MG PO TABS: 1.0000 mg | ORAL_TABLET | Freq: Once | ORAL | 0 refills | Status: AC

## 2017-10-29 NOTE — Telephone Encounter (Signed)
Pt coming in tomorrow for labs, please place future orders. Thank you.  

## 2017-10-29 NOTE — Telephone Encounter (Signed)
Dexamethasone sent to pharmacy. Please let the patient know. Labs ordered.

## 2017-10-29 NOTE — Telephone Encounter (Signed)
Patient called pharmacy and dexamethasone is not there please send med over pt has lab appt scheduled for tomorrow.

## 2017-10-29 NOTE — Telephone Encounter (Signed)
Patient notified and voiced understanding.

## 2017-10-29 NOTE — Telephone Encounter (Signed)
Labs ordered.

## 2017-10-30 ENCOUNTER — Other Ambulatory Visit (INDEPENDENT_AMBULATORY_CARE_PROVIDER_SITE_OTHER): Payer: Medicare Other

## 2017-10-30 DIAGNOSIS — E278 Other specified disorders of adrenal gland: Secondary | ICD-10-CM

## 2017-10-30 LAB — CORTISOL: CORTISOL PLASMA: 1.5 ug/dL

## 2017-11-04 ENCOUNTER — Other Ambulatory Visit: Payer: Self-pay | Admitting: Family Medicine

## 2017-11-11 ENCOUNTER — Other Ambulatory Visit: Payer: Self-pay | Admitting: Family Medicine

## 2017-12-15 ENCOUNTER — Encounter: Payer: Self-pay | Admitting: Family Medicine

## 2017-12-15 DIAGNOSIS — Z1211 Encounter for screening for malignant neoplasm of colon: Secondary | ICD-10-CM

## 2018-01-15 ENCOUNTER — Encounter: Payer: Self-pay | Admitting: Family Medicine

## 2018-01-16 DIAGNOSIS — J069 Acute upper respiratory infection, unspecified: Secondary | ICD-10-CM | POA: Diagnosis not present

## 2018-01-20 ENCOUNTER — Other Ambulatory Visit: Payer: Self-pay | Admitting: Family Medicine

## 2018-02-03 ENCOUNTER — Other Ambulatory Visit: Payer: Self-pay | Admitting: Family Medicine

## 2018-02-10 ENCOUNTER — Other Ambulatory Visit: Payer: Self-pay | Admitting: Family Medicine

## 2018-04-06 ENCOUNTER — Encounter: Payer: Self-pay | Admitting: Family Medicine

## 2018-04-08 ENCOUNTER — Other Ambulatory Visit: Payer: Self-pay | Admitting: Family Medicine

## 2018-04-09 ENCOUNTER — Encounter: Payer: Self-pay | Admitting: Family Medicine

## 2018-04-09 ENCOUNTER — Other Ambulatory Visit: Payer: Self-pay

## 2018-04-09 ENCOUNTER — Ambulatory Visit (INDEPENDENT_AMBULATORY_CARE_PROVIDER_SITE_OTHER): Payer: Medicare Other | Admitting: Family Medicine

## 2018-04-09 VITALS — BP 140/90 | HR 98 | Temp 98.2°F | Ht 70.0 in | Wt 233.6 lb

## 2018-04-09 DIAGNOSIS — E785 Hyperlipidemia, unspecified: Secondary | ICD-10-CM | POA: Diagnosis not present

## 2018-04-09 DIAGNOSIS — D3502 Benign neoplasm of left adrenal gland: Secondary | ICD-10-CM

## 2018-04-09 DIAGNOSIS — E1159 Type 2 diabetes mellitus with other circulatory complications: Secondary | ICD-10-CM

## 2018-04-09 DIAGNOSIS — Z1211 Encounter for screening for malignant neoplasm of colon: Secondary | ICD-10-CM

## 2018-04-09 DIAGNOSIS — I10 Essential (primary) hypertension: Secondary | ICD-10-CM

## 2018-04-09 DIAGNOSIS — B351 Tinea unguium: Secondary | ICD-10-CM

## 2018-04-09 LAB — LIPID PANEL
Cholesterol: 138 mg/dL (ref 0–200)
HDL: 44.9 mg/dL (ref >39.00)
LDL Cholesterol: 59 mg/dL (ref 0–99)
NonHDL: 92.67
TRIGLYCERIDES: 168 mg/dL — AB (ref 0.0–149.0)
Total CHOL/HDL Ratio: 3
VLDL: 33.6 mg/dL (ref 0.0–40.0)

## 2018-04-09 LAB — COMPREHENSIVE METABOLIC PANEL
ALT: 27 U/L (ref 0–53)
AST: 20 U/L (ref 0–37)
Albumin: 4.9 g/dL (ref 3.5–5.2)
Alkaline Phosphatase: 50 U/L (ref 39–117)
BUN: 13 mg/dL (ref 6–23)
CHLORIDE: 102 meq/L (ref 96–112)
CO2: 27 mEq/L (ref 19–32)
Calcium: 10.3 mg/dL (ref 8.4–10.5)
Creatinine, Ser: 0.99 mg/dL (ref 0.40–1.50)
GFR: 74.35 mL/min (ref >60.00)
Glucose, Bld: 162 mg/dL — ABNORMAL HIGH (ref 70–99)
Potassium: 4.5 mEq/L (ref 3.5–5.1)
Sodium: 137 mEq/L (ref 135–145)
Total Bilirubin: 1.3 mg/dL — ABNORMAL HIGH (ref 0.2–1.2)
Total Protein: 7.8 g/dL (ref 6.0–8.3)

## 2018-04-09 LAB — HEMOGLOBIN A1C: Hgb A1c MFr Bld: 7.4 % — ABNORMAL HIGH (ref 4.6–6.5)

## 2018-04-09 NOTE — Assessment & Plan Note (Signed)
-

## 2018-04-09 NOTE — Assessment & Plan Note (Signed)
We will complete lab evaluation for this.

## 2018-04-09 NOTE — Patient Instructions (Signed)
Nice to see you. We will get lab work today and contact you with the results. Please complete the 24-hour urine collection. Please remember to take your blood pressure medication each day. If you do not get Cologuard in the mail in the next several weeks please contact us.

## 2018-04-09 NOTE — Progress Notes (Signed)
Eric Sonnenberg, MD Phone: [phone]  Dakota Gonzalez is a 73 y.o. male who presents today for follow-up.  HYPERTENSION Disease Monitoring: Blood pressure range-110-130/65-70 chest pain-no      dyspnea-no Medications: Compliance-taking amlodipine, lisinopril   Edema-no  DIABETES Disease Monitoring: Blood Sugar ranges-typically 110-122, rare excursions greater than 200 polyuria/phagia/dipsia-no      Optho-up-to-date Medications: Compliance-taking glimepiride, metformin hypoglycemic symptoms-, rare over the last 6 months.  He will dropped into the 70s and feels lightheaded.  He will eat and feel better.  HYPERLIPIDEMIA Disease Monitoring: See symptoms for Hypertension Medications: Compliance-taking Lipitor right upper quadrant pain-no muscle aches-no  Left adrenal adenoma: Patient had follow-up imaging of this previously that revealed a left adrenal adenoma.  He completed part of his lab work-up.  He is due for the rest of this.     Social History   Tobacco Use  Smoking Status Former Smoker  Smokeless Tobacco Never Used     ROS see history of present illness  Objective  Physical Exam Vitals:   04/09/18 0820  BP: 140/90  Pulse: 98  Temp: 98.2 F (36.8 C)  SpO2: 98%    BP Readings from Last 3 Encounters:  04/09/18 140/90  10/09/17 (!) 160/80  06/20/17 138/80   Wt Readings from Last 3 Encounters:  04/09/18 233 lb 9.6 oz (106 kg)  10/09/17 232 lb 9.6 oz (105.5 kg)  06/20/17 234 lb 3.2 oz (106.2 kg)    Physical Exam Constitutional:      General: He is not in acute distress.    Appearance: He is not diaphoretic.  Cardiovascular:     Rate and Rhythm: Normal rate and regular rhythm.     Heart sounds: Normal heart sounds.  Pulmonary:     Effort: Pulmonary effort is normal.     Breath sounds: Normal breath sounds.  Musculoskeletal:     Right lower leg: No edema.     Left lower leg: No edema.  Skin:    General: Skin is warm and dry.  Neurological:      Mental Status: He is alert.    Diabetic Foot Exam - Simple   Simple Foot Form Diabetic Foot exam was performed with the following findings:  Yes 04/09/2018  8:32 AM  Visual Inspection See comments:  Yes Sensation Testing Intact to touch and monofilament testing bilaterally:  Yes Pulse Check Posterior Tibialis and Dorsalis pulse intact bilaterally:  Yes Comments Onychomycosis bilaterally, no other deformities, ulcerations, or skin breakdown      Assessment/Plan: Please see individual problem list.  HTN (hypertension) Adequately controlled at home.  Discussed taking his blood pressure medication before he comes to the office next time.  He will continue his current regimen.  Check lab work.  Adrenal adenoma, left We will complete lab evaluation for this.  Diabetes mellitus type 2, controlled ([facility]) Check A1c.  Continue current medication.  Foot exam completed.  Onychomycosis Chronic issue.  He notes this has responded well to topical over-the-counter medication.  He notes he will retry this.  Hyperlipidemia Continue Lipitor.  Check lipid panel.   Health Maintenance: Cologuard ordered.  Orders Placed This Encounter  Procedures  . Catecholamines, fractionated, Urine, 24 hour    Standing Status:   Future    Standing Expiration Date:   04/09/2019  . Metanephrines, Urine, 24 hour    Standing Status:   Future    Standing Expiration Date:   04/09/2019  . Aldosterone + renin activity w/ ratio  . Comp Met (  CMET)  . HgB A1c  . Lipid panel  . Cologuard    No orders of the defined types were placed in this encounter.    Eric Sonnenberg, MD [facility] [place]  

## 2018-04-09 NOTE — Assessment & Plan Note (Signed)
Check A1c.  Continue current medication.  Foot exam completed.

## 2018-04-09 NOTE — Assessment & Plan Note (Signed)
Chronic issue.  He notes this has responded well to topical over-the-counter medication.  He notes he will retry this.

## 2018-04-09 NOTE — Assessment & Plan Note (Signed)
Adequately controlled at home.  Discussed taking his blood pressure medication before he comes to the office next time.  He will continue his current regimen.  Check lab work.

## 2018-04-11 DIAGNOSIS — D3502 Benign neoplasm of left adrenal gland: Secondary | ICD-10-CM | POA: Diagnosis not present

## 2018-04-11 NOTE — Addendum Note (Signed)
Addended by: Leeanne Rio on: 04/11/2018 08:31 AM   Modules accepted: Orders

## 2018-04-13 LAB — ALDOSTERONE + RENIN ACTIVITY W/ RATIO
ALDO / PRA Ratio: 0.4 Ratio — ABNORMAL LOW (ref 0.9–28.9)
Aldosterone: 1 ng/dL
Renin Activity: 2.48 ng/mL/h (ref 0.25–5.82)

## 2018-04-14 LAB — CATECHOLAMINES, FRACTIONATED, URINE, 24 HOUR
Calculated Total (E+NE): 41 mcg/24 h (ref 26–121)
Creatinine, Urine mg/day-CATEUR: 1.62 g/(24.h) (ref 0.50–2.15)
Dopamine, 24 hr Urine: 192 mcg/24 h (ref 52–480)
Norepinephrine, 24 hr Ur: 41 mcg/24 h (ref 15–100)
Volume, Urine-VMAUR: 2250 mL

## 2018-04-14 LAB — METANEPHRINES, URINE, 24 HOUR
Metaneph Total, Ur: 889 mcg/24 h — ABNORMAL HIGH (ref 224–832)
Metanephrines, Ur: 205 mcg/24 h (ref 90–315)
NORMETANEPHRINE 24H UR: 684 ug/(24.h) — AB (ref 122–676)
Volume, Urine-VMAUR: 2250 mL

## 2018-04-16 ENCOUNTER — Other Ambulatory Visit: Payer: Self-pay | Admitting: Family Medicine

## 2018-04-16 DIAGNOSIS — D3502 Benign neoplasm of left adrenal gland: Secondary | ICD-10-CM

## 2018-04-18 DIAGNOSIS — Z1211 Encounter for screening for malignant neoplasm of colon: Secondary | ICD-10-CM | POA: Diagnosis not present

## 2018-04-19 LAB — COLOGUARD: Cologuard: NEGATIVE

## 2018-04-21 ENCOUNTER — Encounter: Payer: Self-pay | Admitting: *Deleted

## 2018-04-21 ENCOUNTER — Other Ambulatory Visit: Payer: Self-pay | Admitting: Family Medicine

## 2018-04-29 ENCOUNTER — Other Ambulatory Visit: Payer: Self-pay

## 2018-04-29 ENCOUNTER — Ambulatory Visit: Payer: Self-pay

## 2018-04-29 ENCOUNTER — Ambulatory Visit (INDEPENDENT_AMBULATORY_CARE_PROVIDER_SITE_OTHER): Payer: Medicare Other

## 2018-04-29 DIAGNOSIS — Z0000 Encounter for general adult medical examination without abnormal findings: Secondary | ICD-10-CM | POA: Diagnosis not present

## 2018-04-29 NOTE — Patient Instructions (Addendum)
  Mr. Dakota Gonzalez , Thank you for taking time to come for your Medicare Wellness Visit. I appreciate your ongoing commitment to your health goals. Please review the following plan we discussed and let me know if I can assist you in the future.   These are the goals we discussed: Goals      Patient Stated   . DIET - REDUCE SUGAR INTAKE (pt-stated)     Low carb diet    . Increase physical activity (pt-stated)     Resume exercise regimen at the gym       This is a list of the screening recommended for you and due dates:  Health Maintenance  Topic Date Due  . Eye exam for diabetics  05/01/2018  . Flu Shot  08/16/2018  . Hemoglobin A1C  10/10/2018  . Complete foot exam   04/09/2019  . Cologuard (Stool DNA test)  04/18/2021  . Tetanus Vaccine  03/28/2027  .  Hepatitis C: One time screening is recommended by Center for Disease Control  (CDC) for  adults born from [birth year] through 1964.   Completed  . Pneumonia vaccines  Completed

## 2018-04-29 NOTE — Progress Notes (Signed)
I have reviewed the above note and agree.  Rosine Solecki, M.D.  

## 2018-04-29 NOTE — Progress Notes (Signed)
Subjective:   Dakota Gonzalez is a 73 y.o. male who presents for Medicare Annual/Subsequent preventive examination.  Review of Systems:  No ROS.  Medicare Wellness Visit. Additional risk factors are reflected in the social history. Cardiac Risk Factors include: advanced age (>57mn, >>57women);male gender;diabetes mellitus;hypertension     Objective:    Vitals: There were no vitals taken for this visit.  There is no height or weight on file to calculate BMI.   UTA vital signs, virtual visit. Advanced Directives 04/29/2018 04/26/2017 04/25/2016 11/28/2014  Does Patient Have a Medical Advance Directive? Yes Yes No No  Type of Advance Directive Living will [place] will - -  Does patient want to make changes to medical advance directive? No - Patient declined No - Patient declined - -  Copy of [place]? - No - copy requested - -  Would patient like information on creating a medical advance directive? - - No - Patient declined -    Tobacco Social History   Tobacco Use  Smoking Status Former Smoker  Smokeless Tobacco Never Used     Counseling given: Not Answered   Clinical Intake:  Pre-visit preparation completed: Yes       Diabetes: Yes(Followed by pcp)  How often do you need to have someone help you when you read instructions, pamphlets, or other written materials from your doctor or pharmacy?: 1 - Never  Interpreter Needed?: No     Past Medical History:  Diagnosis Date  . Allergy   . Arthritis   . Asthma   . Depression   . Diabetes mellitus 2007  . Enlarged prostate   . GERD (gastroesophageal reflux disease)   . Headache(784.0)   . Hyperlipidemia   . Hypertension   . Migraine   . Sinusitis    Past Surgical History:  Procedure Laterality Date  . BACK SURGERY  10/2003   [facility] . HEM[facility]IOMA EXCISION  2014   right upper arm, Dr. SRonalee Belts . PROSTATE BIOPSY  2014   Dr. CJacqlyn Larsen . TONSILLECTOMY AND ADENOIDECTOMY  1960   Family History  Problem Relation Age of Onset  . Stroke Mother   . Kidney disease Mother   . Diabetes Mother   . Cancer Father   . Stroke Father   . Kidney disease Father   . Cancer Other        colon, breast   Social History   Socioeconomic History  . Marital status: Widowed    Spouse name: Not on file  . Number of children: Not on file  . Years of education: Not on file  . Highest education level: Not on file  Occupational History  . Not on file  Social Needs  . Financial resource strain: Not hard at all  . Food insecurity:    Worry: Never true    Inability: Never true  . Transportation needs:    Medical: No    Non-medical: No  Tobacco Use  . Smoking status: Former SResearch scientist (life sciences) . Smokeless tobacco: Never Used  Substance and Sexual Activity  . Alcohol use: No  . Drug use: No  . Sexual activity: Never  Lifestyle  . Physical activity:    Days per week: 7 days    Minutes per session: 20 min  . Stress: Not at all  Relationships  . Social connections:    Talks on phone: Not on file    Gets  together: Not on file    Attends religious service: Not on file    Active member of club or organization: Not on file    Attends meetings of clubs or organizations: Not on file    Relationship status: Not on file  Other Topics Concern  . Not on file  Social History Narrative   Lives in [place]. 1 son lives in [place].      Work - Disabled, previously Museum/gallery conservator and railroad.      Diet - regular   Exercise - ellipitical machine at home    Outpatient Encounter Medications as of 04/29/2018  Medication Sig  . amLODipine (NORVASC) 2.5 MG tablet TAKE 1 TABLET(2.5 MG) BY MOUTH DAILY  . aspirin 81 MG tablet Take 81 mg by mouth daily.  Marland Kitchen atorvastatin (LIPITOR) 40 MG tablet TAKE 1 TABLET(40 MG) BY MOUTH DAILY  . blood glucose meter kit and supplies One touch ultra, test once daily, E11.9  . Blood Glucose Monitoring  Suppl (ONE TOUCH ULTRA SYSTEM KIT) W/DEVICE KIT 1 kit by Does not apply route once.  . Coenzyme Q10 (COQ-10) 100 MG CAPS Take 1 tablet by mouth daily.  . cyclobenzaprine (FLEXERIL) 10 MG tablet Take 1 tablet (10 mg total) by mouth every 8 (eight) hours as needed for muscle spasms (do not drive while using, may make you drowsy).  . finasteride (PROSCAR) 5 MG tablet Take 5 mg by mouth daily.  Marland Kitchen glimepiride (AMARYL) 4 MG tablet TAKE 1 TABLET BY MOUTH TWICE DAILY  . glucose blood (ONE TOUCH ULTRA TEST) test strip TEST ONCE A DAY AS DIRECTED  . lisinopril (PRINIVIL,ZESTRIL) 20 MG tablet TAKE 1 TABLET(20 MG) BY MOUTH DAILY  . metFORMIN (GLUCOPHAGE) 500 MG tablet TAKE 2 TABLETS(1000 MG) BY MOUTH TWICE DAILY WITH A MEAL  . Multiple Vitamins-Minerals (CENTRUM SILVER PO) Take 1 tablet by mouth daily.  Glory Rosebush DELICA LANCETS FINE MISC TEST BLOOD SUGAR ONCE A DAY  . S-Adenosylmethionine (SAM-E COMPLETE PO) Take by mouth.  . tamsulosin (FLOMAX) 0.4 MG CAPS capsule Take 1 capsule (0.4 mg total) by mouth daily.  . [DISCONTINUED] STOOL SOFTENER 100 MG capsule    No facility-administered encounter medications on file as of 04/29/2018.     Activities of Daily Living In your present state of health, do you have any difficulty performing the following activities: 04/29/2018  Hearing? N  Vision? N  Difficulty concentrating or making decisions? N  Walking or climbing stairs? N  Dressing or bathing? N  Doing errands, shopping? N  Preparing Food and eating ? N  Using the Toilet? N  In the past six months, have you accidently leaked urine? N  Do you have problems with loss of bowel control? N  Managing your Medications? N  Managing your Finances? N  Housekeeping or managing your Housekeeping? N  Some recent data might be hidden    Patient Care Team: [name], MD as PCP - General (Family Medicine) Wellington Hampshire, MD as Consulting Physician (Cardiology)   Assessment:   This is a routine  wellness examination for Dakota Gonzalez.  I connected with patient 04/29/18 at 11:30 AM EDT by a video enabled telemedicine application and verified that I am speaking with the correct person using two identifiers. Patient stated full name and DOB. Patient gave permission to continue with virtual visit. Patient's location was at home and Nurse's location was at [place] office.  Health Screenings  Cologuard 04/19/18- negative Glaucoma -none reported Hearing -demonstrated normal hearing  during conversation. Hemoglobin A1C -04/09/18 (7.4) Reports daily average blood sugar range 110-120. Cholesterol -04/09/18 (138) Vision- every 12 months. Notes no retinopathy.   Social  Alcohol intake -none Smoking history- none Smokers in home? none Illicit drug use? none Exercise -treadmill daily Diet - low carb, monitors closely Sexually Active -never  Safety  Patient feels safe at home.  Patient does have smoke detectors at home  Patient does wear sunscreen or protective clothing when in direct sunlight  Patient does wear seat belt when driving or riding with others.   Activities of Daily Living Patient can do their own household chores. Denies needing assistance with: driving, feeding themselves, getting from bed to chair, getting to the toilet, bathing/showering, dressing, managing money, climbing flight of stairs, or preparing meals.   Depression Screen Patient denies losing interest in daily life, feeling hopeless, or crying easily over simple problems.   Fall Screen Patient denies being afraid of falling or falling in the last year.   Memory Screen Patient denies problems with memory, misplacing items, and is able to balance checkbook/bank accounts.  Patient is alert, normal appearance, oriented to person/place/and time. Correctly identified the president of the [place], recall of 2/3 objects, and performing simple calculations.  Patient displays appropriate judgement and can read correct time from  watch face.   Immunizations The following Immunizations are up to date: Influenza, shingles, pneumonia, and tetanus.   Other Providers Patient Care Team: [name], MD as PCP - General (Family Medicine) Wellington Hampshire, MD as Consulting Physician (Cardiology)  Exercise Activities and Dietary recommendations Current Exercise Habits: Home exercise routine, Type of exercise: walking, Time (Minutes): 20, Frequency (Times/Week): 7, Weekly Exercise (Minutes/Week): 140, Intensity: Mild  Goals      Patient Stated   . DIET - REDUCE SUGAR INTAKE (pt-stated)     Low carb diet    . Increase physical activity (pt-stated)     Resume exercise regimen at the gym       Fall Risk Fall Risk  04/29/2018 10/09/2017 04/26/2017 04/25/2016 08/17/2015  Falls in the past year? 0 No No No No   Depression Screen PHQ 2/9 Scores 04/29/2018 10/09/2017 04/26/2017 04/25/2016  PHQ - 2 Score 0 0 0 1    Cognitive Function MMSE - Mini Mental State Exam 04/25/2016  Orientation to time 5  Orientation to Place 5  Registration 3  Attention/ Calculation 5  Recall 3  Language- name 2 objects 2  Language- repeat 1  Language- follow 3 step command 3  Language- read & follow direction 1  Write a sentence 1  Copy design 1  Total score 30     6CIT Screen 04/29/2018 04/26/2017  What Year? 0 points 0 points  What month? 0 points 0 points  What time? 0 points 0 points  Count back from 20 0 points 0 points  Months in reverse 0 points 0 points  Repeat phrase 0 points 0 points  Total Score 0 0    Immunization History  Administered Date(s) Administered  . Influenza Split 10/17/2011  . Influenza, High Dose Seasonal PF 09/19/2016, 10/03/2017  . Influenza,inj,Quad PF,6+ Mos 09/22/2012, 10/10/2013, 11/10/2014  . Influenza-Unspecified 11/03/2017  . Pneumococcal Conjugate-13 06/12/2013  . Pneumococcal Polysaccharide-23 10/17/2011  . Tdap 03/20/2017   Screening Tests Health Maintenance  Topic Date Due  .  OPHTHALMOLOGY EXAM  05/01/2018  . INFLUENZA VACCINE  08/16/2018  . HEMOGLOBIN A1C  10/10/2018  . FOOT EXAM  04/09/2019  . Fecal DNA (Cologuard)  04/18/2021  . TETANUS/TDAP  03/28/2027  . Hepatitis C Screening  Completed  . PNA vac Low Risk Adult  Completed       Plan:    End of life planning; Advance aging; Advanced directives discussed. Copy of current HCPOA/Living Will requested.    I have personally reviewed and noted the following in the patient's chart:   . Medical and social history . Use of alcohol, tobacco or illicit drugs  . Current medications and supplements . Functional ability and status . Nutritional status . Physical activity . Advanced directives . List of other physicians . Hospitalizations, surgeries, and ER visits in previous 12 months . Vitals . Screenings to include cognitive, depression, and falls . Referrals and appointments  In addition, I have reviewed and discussed with patient certain preventive protocols, quality metrics, and best practice recommendations. A written personalized care plan for preventive services as well as general preventive health recommendations were provided to patient.     Varney Biles, LPN  [id number]

## 2018-05-12 ENCOUNTER — Other Ambulatory Visit: Payer: Self-pay | Admitting: Family Medicine

## 2018-05-26 ENCOUNTER — Encounter: Payer: Self-pay | Admitting: Family Medicine

## 2018-06-02 ENCOUNTER — Other Ambulatory Visit: Payer: Self-pay

## 2018-06-02 MED ORDER — GLUCOSE BLOOD VI STRP: ORAL_STRIP | 5 refills | Status: DC

## 2018-06-02 MED ORDER — LANCETS MISC. MISC: 5 refills | Status: DC

## 2018-07-18 ENCOUNTER — Encounter: Payer: Self-pay | Admitting: Family Medicine

## 2018-07-21 ENCOUNTER — Other Ambulatory Visit: Payer: Self-pay | Admitting: Family Medicine

## 2018-07-21 MED ORDER — FINASTERIDE 5 MG PO TABS: 5.0000 mg | ORAL_TABLET | Freq: Every day | ORAL | 1 refills | Status: DC

## 2018-07-31 ENCOUNTER — Encounter: Payer: Self-pay | Admitting: Family Medicine

## 2018-08-01 ENCOUNTER — Other Ambulatory Visit: Payer: Self-pay | Admitting: *Deleted

## 2018-08-01 MED ORDER — TAMSULOSIN HCL 0.4 MG PO CAPS: 0.4000 mg | ORAL_CAPSULE | Freq: Every day | ORAL | 3 refills | Status: DC

## 2018-08-01 NOTE — Progress Notes (Unsigned)
Ok to fill this medication has not been filled since 05/10/2016 by you?

## 2018-08-01 NOTE — Telephone Encounter (Signed)
This medication Tamsulosin has been filled by PCP since 05/10/16  Filed for 90 X 3 refills OK to fill?

## 2018-08-04 ENCOUNTER — Other Ambulatory Visit: Payer: Self-pay | Admitting: Family Medicine

## 2018-08-11 ENCOUNTER — Other Ambulatory Visit: Payer: Self-pay | Admitting: Family Medicine

## 2018-08-13 ENCOUNTER — Encounter: Payer: Self-pay | Admitting: Family Medicine

## 2018-08-13 ENCOUNTER — Ambulatory Visit: Payer: Self-pay | Admitting: Family Medicine

## 2018-08-13 ENCOUNTER — Other Ambulatory Visit: Payer: Self-pay

## 2018-08-13 ENCOUNTER — Ambulatory Visit (INDEPENDENT_AMBULATORY_CARE_PROVIDER_SITE_OTHER): Payer: Medicare Other | Admitting: Family Medicine

## 2018-08-13 DIAGNOSIS — E1159 Type 2 diabetes mellitus with other circulatory complications: Secondary | ICD-10-CM

## 2018-08-13 DIAGNOSIS — D3502 Benign neoplasm of left adrenal gland: Secondary | ICD-10-CM

## 2018-08-13 DIAGNOSIS — I10 Essential (primary) hypertension: Secondary | ICD-10-CM

## 2018-08-13 DIAGNOSIS — E785 Hyperlipidemia, unspecified: Secondary | ICD-10-CM

## 2018-08-13 NOTE — Assessment & Plan Note (Signed)
Adequately controlled.  Continue current regimen.  He will come in for lab work.

## 2018-08-13 NOTE — Assessment & Plan Note (Signed)
Prior lipid panel well controlled.  Continue Lipitor.

## 2018-08-13 NOTE — Assessment & Plan Note (Signed)
Seems to be well controlled.  Continue current regimen.  Check A1c.

## 2018-08-13 NOTE — Assessment & Plan Note (Signed)
Nonspecific mild elevation of urine metanephrines.  Endocrinology recommended rechecking after a few months.  Patient has stopped his supplement.  We will plan on having him complete urine metanephrines again.

## 2018-08-13 NOTE — Progress Notes (Signed)
Virtual Visit via telephone Note  This visit type was conducted due to national recommendations for restrictions regarding the COVID-19 pandemic (e.g. social distancing).  This format is felt to be most appropriate for this patient at this time.  All issues noted in this document were discussed and addressed.  No physical exam was performed (except for noted visual exam findings with Video Visits).   I connected with Dakota Gonzalez today at  4:30 PM EDT by telephone and verified that I am speaking with the correct person using two identifiers. Location patient: home Location provider: work  Persons participating in the virtual visit: patient, provider  I discussed the limitations, risks, security and privacy concerns of performing an evaluation and management service by telephone and the availability of in person appointments. I also discussed with the patient that there may be a patient responsible charge related to this service. The patient expressed understanding and agreed to proceed.  Interactive audio and video telecommunications were attempted between this provider and patient, however failed, due to patient having technical difficulties OR patient did not have access to video capability.  We continued and completed visit with audio only.   Reason for visit: follow-up  HPI: HYPERTENSION Disease Monitoring: Blood pressure range-111-130s/60s-70s chest pain-no      dyspnea- no Medications: Compliance-taking amlodipine, lisinopril lightheadedness- no   Edema- no  DIABETES Disease Monitoring: Blood Sugar ranges-110-130 though has had some excursions up into the 200s on 2 occasions prior to lunch over the last 3 months, came down into the 70s and 90s after taking his glimepiride. Polyuria/phagia/dipsia- no      Optho-has an appointment in October Medications: Compliance-taking glimepiride and metformin hypoglycemic symptoms-no  HYPERLIPIDEMIA Disease Monitoring: See symptoms for  Hypertension Medications: Compliance-taking Lipitor right upper quadrant pain- no  Muscle aches- no  Nonspecific urine metanephrine elevation: Previously noted to have this on urine testing.  Endocrinology recommended rechecking around now.  He has discontinued SAM-E.      ROS: See pertinent positives and negatives per HPI.  Past Medical History:  Diagnosis Date  . Allergy   . Arthritis   . Asthma   . Depression   . Diabetes mellitus 2007  . Enlarged prostate   . GERD (gastroesophageal reflux disease)   . Headache(784.0)   . Hyperlipidemia   . Hypertension   . Migraine   . Sinusitis     Past Surgical History:  Procedure Laterality Date  . BACK SURGERY  10/2003   ruptured disc, L4[facility]5, [facility]  . HEMANGIOMA EXCISION  2014   right upper arm, Dr. Ronalee Belts  . PROSTATE BIOPSY  2014   Dr. Jacqlyn Larsen  . TONSILLECTOMY AND ADENOIDECTOMY  1960    Family History  Problem Relation Age of Onset  . Stroke Mother   . Kidney disease Mother   . Diabetes Mother   . Cancer Father   . Stroke Father   . Kidney disease Father   . Cancer Other        colon, breast    SOCIAL HX: Former smoker   Current Outpatient Medications:  .  amLODipine (NORVASC) 2.5 MG tablet, TAKE 1 TABLET(2.5 MG) BY MOUTH DAILY, Disp: 90 tablet, Rfl: 0 .  aspirin 81 MG tablet, Take 81 mg by mouth daily., Disp: , Rfl:  .  atorvastatin (LIPITOR) 40 MG tablet, TAKE 1 TABLET(40 MG) BY MOUTH DAILY, Disp: 90 tablet, Rfl: 0 .  blood glucose meter kit and supplies, One touch ultra, test once daily, E11.9,  Disp: 1 each, Rfl: 0 .  Blood Glucose Monitoring Suppl (ONE TOUCH ULTRA SYSTEM KIT) W/DEVICE KIT, 1 kit by Does not apply route once., Disp: 1 each, Rfl: 0 .  Coenzyme Q10 (COQ-10) 100 MG CAPS, Take 1 tablet by mouth daily., Disp: , Rfl:  .  cyclobenzaprine (FLEXERIL) 10 MG tablet, Take 1 tablet (10 mg total) by mouth every 8 (eight) hours as needed for muscle spasms (do not drive while using, may make you  drowsy)., Disp: 60 tablet, Rfl: 1 .  finasteride (PROSCAR) 5 MG tablet, Take 1 tablet (5 mg total) by mouth daily., Disp: 90 tablet, Rfl: 1 .  glimepiride (AMARYL) 4 MG tablet, TAKE 1 TABLET BY MOUTH TWICE DAILY, Disp: 180 tablet, Rfl: 0 .  glucose blood (ONE TOUCH ULTRA TEST) test strip, TEST TWICE A DAILY AS DIRECTED, Disp: 200 each, Rfl: 5 .  Lancets Misc. MISC, ONETOUCH DELICA LANCETS FINE MISC check blood sugar twice daily, Disp: 200 each, Rfl: 5 .  lisinopril (ZESTRIL) 20 MG tablet, TAKE 1 TABLET(20 MG) BY MOUTH DAILY, Disp: 90 tablet, Rfl: 1 .  metFORMIN (GLUCOPHAGE) 500 MG tablet, TAKE 2 TABLETS(1000 MG) BY MOUTH TWICE DAILY WITH A MEAL, Disp: 360 tablet, Rfl: 3 .  Multiple Vitamins-Minerals (CENTRUM SILVER PO), Take 1 tablet by mouth daily., Disp: , Rfl:  .  tamsulosin (FLOMAX) 0.4 MG CAPS capsule, Take 1 capsule (0.4 mg total) by mouth daily., Disp: 90 capsule, Rfl: 3  EXAM: This was a telehealth telephone visit notes no physical exam was completed.  ASSESSMENT AND PLAN:  Discussed the following assessment and plan:  HTN (hypertension) Adequately controlled.  Continue current regimen.  He will come in for lab work.  Adrenal adenoma, left Nonspecific mild elevation of urine metanephrines.  Endocrinology recommended rechecking after a few months.  Patient has stopped his supplement.  We will plan on having him complete urine metanephrines again.  Diabetes mellitus type 2, controlled ([place]) Seems to be well controlled.  Continue current regimen.  Check A1c.  Hyperlipidemia Prior lipid panel well controlled.  Continue Lipitor.   Social distancing precautions and sick precautions given regarding COVID-19.   I discussed the assessment and treatment plan with the patient. The patient was provided an opportunity to ask questions and all were answered. The patient agreed with the plan and demonstrated an understanding of the instructions.   The patient was advised to call back or  seek an in-person evaluation if the symptoms worsen or if the condition fails to improve as anticipated.  I provided 12 minutes of non-face-to-face time during this encounter.   [name], MD

## 2018-08-18 ENCOUNTER — Other Ambulatory Visit: Payer: Self-pay | Admitting: Family Medicine

## 2018-08-28 ENCOUNTER — Other Ambulatory Visit: Payer: Self-pay

## 2018-08-28 ENCOUNTER — Other Ambulatory Visit (INDEPENDENT_AMBULATORY_CARE_PROVIDER_SITE_OTHER): Payer: Medicare Other

## 2018-08-28 DIAGNOSIS — I10 Essential (primary) hypertension: Secondary | ICD-10-CM | POA: Diagnosis not present

## 2018-08-28 DIAGNOSIS — E1159 Type 2 diabetes mellitus with other circulatory complications: Secondary | ICD-10-CM

## 2018-08-28 DIAGNOSIS — D3502 Benign neoplasm of left adrenal gland: Secondary | ICD-10-CM | POA: Diagnosis not present

## 2018-08-28 LAB — BASIC METABOLIC PANEL
BUN: 11 mg/dL (ref 6–23)
CO2: 26 mEq/L (ref 19–32)
Calcium: 10.1 mg/dL (ref 8.4–10.5)
Chloride: 102 mEq/L (ref 96–112)
Creatinine, Ser: 0.88 mg/dL (ref 0.40–1.50)
GFR: 85.09 mL/min (ref >60.00)
Glucose, Bld: 126 mg/dL — ABNORMAL HIGH (ref 70–99)
Potassium: 4.1 mEq/L (ref 3.5–5.1)
Sodium: 138 mEq/L (ref 135–145)

## 2018-08-28 LAB — HEMOGLOBIN A1C: Hgb A1c MFr Bld: 7.1 % — ABNORMAL HIGH (ref 4.6–6.5)

## 2018-09-01 LAB — METANEPHRINES, URINE, 24 HOUR
Metaneph Total, Ur: 551 mcg/24 h (ref 224–832)
Metanephrines, Ur: 146 mcg/24 h (ref 90–315)
Normetanephrine, 24H Ur: 405 mcg/24 h (ref 122–676)
Volume, Urine-VMAUR: 2400 mL

## 2018-09-04 ENCOUNTER — Encounter: Payer: Self-pay | Admitting: Family Medicine

## 2018-09-26 ENCOUNTER — Other Ambulatory Visit: Payer: Self-pay

## 2018-09-26 ENCOUNTER — Ambulatory Visit (INDEPENDENT_AMBULATORY_CARE_PROVIDER_SITE_OTHER): Payer: Medicare Other

## 2018-09-26 DIAGNOSIS — Z23 Encounter for immunization: Secondary | ICD-10-CM | POA: Diagnosis not present

## 2018-10-10 ENCOUNTER — Ambulatory Visit: Payer: Self-pay | Admitting: Family Medicine

## 2018-10-20 ENCOUNTER — Other Ambulatory Visit: Payer: Self-pay | Admitting: Family Medicine

## 2018-11-04 ENCOUNTER — Other Ambulatory Visit: Payer: Self-pay | Admitting: Family Medicine

## 2018-11-06 DIAGNOSIS — E119 Type 2 diabetes mellitus without complications: Secondary | ICD-10-CM | POA: Diagnosis not present

## 2018-11-06 LAB — HM DIABETES EYE EXAM

## 2018-11-10 ENCOUNTER — Other Ambulatory Visit: Payer: Self-pay | Admitting: Family Medicine

## 2018-11-25 ENCOUNTER — Encounter: Payer: Self-pay | Admitting: Family Medicine

## 2018-11-26 ENCOUNTER — Encounter: Payer: Self-pay | Admitting: Family Medicine

## 2018-11-26 ENCOUNTER — Other Ambulatory Visit: Payer: Self-pay

## 2018-11-26 ENCOUNTER — Ambulatory Visit (INDEPENDENT_AMBULATORY_CARE_PROVIDER_SITE_OTHER): Payer: Medicare Other | Admitting: Family Medicine

## 2018-11-26 DIAGNOSIS — J0100 Acute maxillary sinusitis, unspecified: Secondary | ICD-10-CM | POA: Diagnosis not present

## 2018-11-26 DIAGNOSIS — J329 Chronic sinusitis, unspecified: Secondary | ICD-10-CM | POA: Insufficient documentation

## 2018-11-26 MED ORDER — AMOXICILLIN-POT CLAVULANATE 875-125 MG PO TABS: 1.0000 | ORAL_TABLET | Freq: Two times a day (BID) | ORAL | 0 refills | Status: DC

## 2018-11-26 NOTE — Progress Notes (Signed)
Virtual Visit via telephone Note  This visit type was conducted due to national recommendations for restrictions regarding the COVID-19 pandemic (e.g. social distancing).  This format is felt to be most appropriate for this patient at this time.  All issues noted in this document were discussed and addressed.  No physical exam was performed (except for noted visual exam findings with Video Visits).   I connected with Dakota Gonzalez today at  9:30 AM EST by telephone and verified that I am speaking with the correct person using two identifiers. Location patient: home Location provider: work  Persons participating in the virtual visit: patient, provider  I discussed the limitations, risks, security and privacy concerns of performing an evaluation and management service by telephone and the availability of in person appointments. I also discussed with the patient that there may be a patient responsible charge related to this service. The patient expressed understanding and agreed to proceed.  Interactive audio and video telecommunications were attempted between this provider and patient, however failed, due to patient having technical difficulties OR patient did not have access to video capability.  We continued and completed visit with audio only.   Reason for visit: same day   HPI: Left maxillary sinus pressure: Patient feels as though this is a sinus infection.  Symptoms started 3 days ago with fairly significant pressure sensation in his left maxillary sinus area.  He notes it is a little swollen in that area and he has tenderness in the cheekbone.  He has no frontal sinus or right-sided maxillary sinus issues.  He notes no fever or cough.  He is getting some mucus out of his nose.  No eye pain.  No loss of taste or smell.  No shortness of breath.  No postnasal drip.  No ear pain.  He has had sinus infections present like this in the past.  He has no antibiotic allergies.   ROS: See pertinent  positives and negatives per HPI.  Past Medical History:  Diagnosis Date  . Allergy   . Arthritis   . Asthma   . Depression   . Diabetes mellitus 2007  . Enlarged prostate   . GERD (gastroesophageal reflux disease)   . Headache(784.0)   . Hyperlipidemia   . Hypertension   . Migraine   . Sinusitis     Past Surgical History:  Procedure Laterality Date  . BACK SURGERY  10/2003   ruptured disc, L4-5, [facility]  . HEMANGIOMA EXCISION  2014   right upper arm, Dr. Ronalee Belts  . PROSTATE BIOPSY  2014   Dr. Jacqlyn Larsen  . TONSILLECTOMY AND ADENOIDECTOMY  1960    Family History  Problem Relation Age of Onset  . Stroke Mother   . Kidney disease Mother   . Diabetes Mother   . Cancer Father   . Stroke Father   . Kidney disease Father   . Cancer Other        colon, breast    SOCIAL HX: Former smoker.   Current Outpatient Medications:  .  amLODipine (NORVASC) 2.5 MG tablet, TAKE 1 TABLET(2.5 MG) BY MOUTH DAILY, Disp: 90 tablet, Rfl: 0 .  aspirin 81 MG tablet, Take 81 mg by mouth daily., Disp: , Rfl:  .  atorvastatin (LIPITOR) 40 MG tablet, TAKE 1 TABLET(40 MG) BY MOUTH DAILY, Disp: 90 tablet, Rfl: 0 .  blood glucose meter kit and supplies, One touch ultra, test once daily, E11.9, Disp: 1 each, Rfl: 0 .  Blood Glucose  Monitoring Suppl (ONE TOUCH ULTRA SYSTEM KIT) W/DEVICE KIT, 1 kit by Does not apply route once., Disp: 1 each, Rfl: 0 .  Coenzyme Q10 (COQ-10) 100 MG CAPS, Take 1 tablet by mouth daily., Disp: , Rfl:  .  cyclobenzaprine (FLEXERIL) 10 MG tablet, Take 1 tablet (10 mg total) by mouth every 8 (eight) hours as needed for muscle spasms (do not drive while using, may make you drowsy)., Disp: 60 tablet, Rfl: 1 .  finasteride (PROSCAR) 5 MG tablet, Take 1 tablet (5 mg total) by mouth daily., Disp: 90 tablet, Rfl: 1 .  glimepiride (AMARYL) 4 MG tablet, TAKE 1 TABLET BY MOUTH TWICE DAILY, Disp: 180 tablet, Rfl: 0 .  glucose blood (ONE TOUCH ULTRA TEST) test strip, TEST TWICE A  DAILY AS DIRECTED, Disp: 200 each, Rfl: 5 .  Lancets Misc. MISC, ONETOUCH DELICA LANCETS FINE MISC check blood sugar twice daily, Disp: 200 each, Rfl: 5 .  lisinopril (ZESTRIL) 20 MG tablet, TAKE 1 TABLET(20 MG) BY MOUTH DAILY, Disp: 90 tablet, Rfl: 1 .  metFORMIN (GLUCOPHAGE) 500 MG tablet, TAKE 2 TABLETS(1000 MG) BY MOUTH TWICE DAILY WITH A MEAL, Disp: 360 tablet, Rfl: 3 .  Multiple Vitamins-Minerals (CENTRUM SILVER PO), Take 1 tablet by mouth daily., Disp: , Rfl:  .  tamsulosin (FLOMAX) 0.4 MG CAPS capsule, Take 1 capsule (0.4 mg total) by mouth daily., Disp: 90 capsule, Rfl: 3 .  amoxicillin-clavulanate (AUGMENTIN) 875-125 MG tablet, Take 1 tablet by mouth 2 (two) times daily., Disp: 14 tablet, Rfl: 0  EXAM: This was a telehealth telephone visit and thus no physical exam was completed.  ASSESSMENT AND PLAN:  Discussed the following assessment and plan:  Sinusitis Patient symptoms seem consistent with sinusitis particularly given focal nature.  I discussed it could also represent a dental issue though he notes no tooth pain.  We will start him on Augmentin given the severity of the sinus symptoms.  Discussed that if he does not start to improve in the next 2 days he should contact us for further evaluation.  Advised if he developed any new symptoms he should contact us right away.  Symptoms do not seem consistent with COVID-19 though the patient was advised that if he developed any symptoms consistent with COVID-19 he should contact us right away.  Discussed monitoring at home until he is improving.    I discussed the assessment and treatment plan with the patient. The patient was provided an opportunity to ask questions and all were answered. The patient agreed with the plan and demonstrated an understanding of the instructions.   The patient was advised to call back or seek an in-person evaluation if the symptoms worsen or if the condition fails to improve as anticipated.  I provided 7  minutes of non-face-to-face time during this encounter.   [name], MD

## 2018-11-26 NOTE — Assessment & Plan Note (Signed)
Patient symptoms seem consistent with sinusitis particularly given focal nature.  I discussed it could also represent a dental issue though he notes no tooth pain.  We will start him on Augmentin given the severity of the sinus symptoms.  Discussed that if he does not start to improve in the next 2 days he should contact us for further evaluation.  Advised if he developed any new symptoms he should contact us right away.  Symptoms do not seem consistent with COVID-19 though the patient was advised that if he developed any symptoms consistent with COVID-19 he should contact us right away.  Discussed monitoring at home until he is improving.

## 2018-11-26 NOTE — Telephone Encounter (Signed)
Patient to be seen for a virtual visit.

## 2019-01-13 ENCOUNTER — Ambulatory Visit (INDEPENDENT_AMBULATORY_CARE_PROVIDER_SITE_OTHER): Payer: Medicare Other | Admitting: Family Medicine

## 2019-01-13 ENCOUNTER — Encounter: Payer: Self-pay | Admitting: Family Medicine

## 2019-01-13 ENCOUNTER — Other Ambulatory Visit: Payer: Self-pay

## 2019-01-13 DIAGNOSIS — R972 Elevated prostate specific antigen [PSA]: Secondary | ICD-10-CM

## 2019-01-13 DIAGNOSIS — N400 Enlarged prostate without lower urinary tract symptoms: Secondary | ICD-10-CM | POA: Diagnosis not present

## 2019-01-13 DIAGNOSIS — I10 Essential (primary) hypertension: Secondary | ICD-10-CM

## 2019-01-13 DIAGNOSIS — E1159 Type 2 diabetes mellitus with other circulatory complications: Secondary | ICD-10-CM | POA: Diagnosis not present

## 2019-01-13 DIAGNOSIS — D3502 Benign neoplasm of left adrenal gland: Secondary | ICD-10-CM

## 2019-01-13 NOTE — Progress Notes (Signed)
Virtual Visit via telephone Note  This visit type was conducted due to national recommendations for restrictions regarding the COVID-19 pandemic (e.g. social distancing).  This format is felt to be most appropriate for this patient at this time.  All issues noted in this document were discussed and addressed.  No physical exam was performed (except for noted visual exam findings with Video Visits).   I connected with Dakota Gonzalez today at  9:00 AM EST by telephone and verified that I am speaking with the correct person using two identifiers. Location patient: home Location provider: work Persons participating in the virtual visit: patient, provider  I discussed the limitations, risks, security and privacy concerns of performing an evaluation and management service by telephone and the availability of in person appointments. I also discussed with the patient that there may be a patient responsible charge related to this service. The patient expressed understanding and agreed to proceed.  Interactive audio and video telecommunications were attempted between this provider and patient, however failed, due to patient having technical difficulties OR patient did not have access to video capability.  We continued and completed visit with audio only.   Reason for visit: follow-up  HPI: BPH: Strain- no Flow- no Frequency- no Urgency- no Nocturia- 0-2x/night Dysuria- no Emptying bladder- yes Medication- flomax, proscar  HYPERTENSION  Disease Monitoring  Home BP Monitoring 110-120s/70s Chest pain- no    Dyspnea- no Medications  Compliance-  Taking amlodipine, lisinopril.   Edema- no  DIABETES Disease Monitoring: Blood Sugar ranges-100s-120s Polyuria/phagia/dipsia- no      Optho- UTD Medications: Compliance- taking glimeperide, metformin Hypoglycemic symptoms- 2 times recently, he will eat something and this will improve     ROS: See pertinent positives and negatives per  HPI.  Past Medical History:  Diagnosis Date  . Allergy   . Arthritis   . Asthma   . Depression   . Diabetes mellitus 2007  . Enlarged prostate   . GERD (gastroesophageal reflux disease)   . Headache(784.0)   . Hyperlipidemia   . Hypertension   . Migraine   . Sinusitis     Past Surgical History:  Procedure Laterality Date  . BACK SURGERY  10/2003   ruptured disc, L4-5, [facility]  . HEMANGIOMA EXCISION  2014   right upper arm, Dr. Ronalee Belts  . PROSTATE BIOPSY  2014   Dr. Jacqlyn Larsen  . TONSILLECTOMY AND ADENOIDECTOMY  1960    Family History  Problem Relation Age of Onset  . Stroke Mother   . Kidney disease Mother   . Diabetes Mother   . Cancer Father   . Stroke Father   . Kidney disease Father   . Cancer Other        colon, breast    SOCIAL HX: Former smoker   Current Outpatient Medications:  .  amLODipine (NORVASC) 2.5 MG tablet, TAKE 1 TABLET(2.5 MG) BY MOUTH DAILY, Disp: 90 tablet, Rfl: 0 .  amoxicillin-clavulanate (AUGMENTIN) 875-125 MG tablet, Take 1 tablet by mouth 2 (two) times daily., Disp: 14 tablet, Rfl: 0 .  aspirin 81 MG tablet, Take 81 mg by mouth daily., Disp: , Rfl:  .  atorvastatin (LIPITOR) 40 MG tablet, TAKE 1 TABLET(40 MG) BY MOUTH DAILY, Disp: 90 tablet, Rfl: 0 .  blood glucose meter kit and supplies, One touch ultra, test once daily, E11.9, Disp: 1 each, Rfl: 0 .  Blood Glucose Monitoring Suppl (ONE TOUCH ULTRA SYSTEM KIT) W/DEVICE KIT, 1 kit by Does not apply route  once., Disp: 1 each, Rfl: 0 .  Coenzyme Q10 (COQ-10) 100 MG CAPS, Take 1 tablet by mouth daily., Disp: , Rfl:  .  cyclobenzaprine (FLEXERIL) 10 MG tablet, Take 1 tablet (10 mg total) by mouth every 8 (eight) hours as needed for muscle spasms (do not drive while using, may make you drowsy)., Disp: 60 tablet, Rfl: 1 .  finasteride (PROSCAR) 5 MG tablet, Take 1 tablet (5 mg total) by mouth daily., Disp: 90 tablet, Rfl: 1 .  glimepiride (AMARYL) 4 MG tablet, TAKE 1 TABLET BY MOUTH TWICE  DAILY, Disp: 180 tablet, Rfl: 0 .  glucose blood (ONE TOUCH ULTRA TEST) test strip, TEST TWICE A DAILY AS DIRECTED, Disp: 200 each, Rfl: 5 .  Lancets Misc. MISC, ONETOUCH DELICA LANCETS FINE MISC check blood sugar twice daily, Disp: 200 each, Rfl: 5 .  lisinopril (ZESTRIL) 20 MG tablet, TAKE 1 TABLET(20 MG) BY MOUTH DAILY, Disp: 90 tablet, Rfl: 1 .  metFORMIN (GLUCOPHAGE) 500 MG tablet, TAKE 2 TABLETS(1000 MG) BY MOUTH TWICE DAILY WITH A MEAL, Disp: 360 tablet, Rfl: 3 .  Multiple Vitamins-Minerals (CENTRUM SILVER PO), Take 1 tablet by mouth daily., Disp: , Rfl:  .  tamsulosin (FLOMAX) 0.4 MG CAPS capsule, Take 1 capsule (0.4 mg total) by mouth daily., Disp: 90 capsule, Rfl: 3  EXAM: This is a telehealth telephone visit and thus no physical exam was completed   ASSESSMENT AND PLAN:  Discussed the following assessment and plan:  HTN (hypertension) Adequately controlled.  Continue current regimen.  Follow-up in 3 months with lab work.  Adrenal adenoma, left Follow-up metanephrines were in the normal range.  No further follow-up needed.  Diabetes mellitus type 2, controlled ([place]) CBGs well-controlled.  Plan for follow-up in 3 months with lab work.  He will continue his current regimen.  BPH (benign prostatic hyperplasia) Well-controlled.  Continue current medications.  Elevated PSA History of this in the distant past.  Recent PSAs have been in the normal range and stable.  Discussed given his age it would be reasonable to discontinue prostate cancer screening.  He agrees with this at this time.  No personal history of prostate cancer.    I discussed the assessment and treatment plan with the patient. The patient was provided an opportunity to ask questions and all were answered. The patient agreed with the plan and demonstrated an understanding of the instructions.   The patient was advised to call back or seek an in-person evaluation if the symptoms worsen or if the condition fails to  improve as anticipated.  I provided 13 minutes of non-face-to-face time during this encounter.   [name], MD

## 2019-01-13 NOTE — Assessment & Plan Note (Signed)
CBGs well-controlled.  Plan for follow-up in 3 months with lab work.  He will continue his current regimen.

## 2019-01-13 NOTE — Assessment & Plan Note (Signed)
Well controlled. Continue current medications  

## 2019-01-13 NOTE — Assessment & Plan Note (Signed)
Adequately controlled.  Continue current regimen.  Follow-up in 3 months with lab work.

## 2019-01-13 NOTE — Assessment & Plan Note (Signed)
Follow-up metanephrines were in the normal range.  No further follow-up needed.

## 2019-01-13 NOTE — Assessment & Plan Note (Signed)
History of this in the distant past.  Recent PSAs have been in the normal range and stable.  Discussed given his age it would be reasonable to discontinue prostate cancer screening.  He agrees with this at this time.  No personal history of prostate cancer.

## 2019-01-14 ENCOUNTER — Other Ambulatory Visit: Payer: Self-pay | Admitting: Family Medicine

## 2019-01-19 ENCOUNTER — Other Ambulatory Visit: Payer: Self-pay | Admitting: Family Medicine

## 2019-01-31 ENCOUNTER — Other Ambulatory Visit: Payer: Self-pay | Admitting: Family Medicine

## 2019-02-09 ENCOUNTER — Other Ambulatory Visit: Payer: Self-pay | Admitting: Family Medicine

## 2019-02-16 ENCOUNTER — Ambulatory Visit: Payer: Medicare Other | Admitting: Family Medicine

## 2019-04-04 ENCOUNTER — Other Ambulatory Visit: Payer: Self-pay | Admitting: Family Medicine

## 2019-04-13 ENCOUNTER — Other Ambulatory Visit: Payer: Self-pay

## 2019-04-13 ENCOUNTER — Encounter: Payer: Self-pay | Admitting: Family Medicine

## 2019-04-13 ENCOUNTER — Ambulatory Visit (INDEPENDENT_AMBULATORY_CARE_PROVIDER_SITE_OTHER): Payer: Medicare Other | Admitting: Family Medicine

## 2019-04-13 DIAGNOSIS — I10 Essential (primary) hypertension: Secondary | ICD-10-CM | POA: Diagnosis not present

## 2019-04-13 DIAGNOSIS — E1159 Type 2 diabetes mellitus with other circulatory complications: Secondary | ICD-10-CM | POA: Diagnosis not present

## 2019-04-13 LAB — COMPREHENSIVE METABOLIC PANEL
ALT: 26 U/L (ref 0–53)
AST: 24 U/L (ref 0–37)
Albumin: 4.8 g/dL (ref 3.5–5.2)
Alkaline Phosphatase: 51 U/L (ref 39–117)
BUN: 12 mg/dL (ref 6–23)
CO2: 26 mEq/L (ref 19–32)
Calcium: 10.3 mg/dL (ref 8.4–10.5)
Chloride: 102 mEq/L (ref 96–112)
Creatinine, Ser: 0.95 mg/dL (ref 0.40–1.50)
GFR: 77.76 mL/min (ref >60.00)
Glucose, Bld: 136 mg/dL — ABNORMAL HIGH (ref 70–99)
Potassium: 4.1 mEq/L (ref 3.5–5.1)
Sodium: 137 mEq/L (ref 135–145)
Total Bilirubin: 1.1 mg/dL (ref 0.2–1.2)
Total Protein: 7.5 g/dL (ref 6.0–8.3)

## 2019-04-13 LAB — HEMOGLOBIN A1C: Hgb A1c MFr Bld: 7.1 % — ABNORMAL HIGH (ref 4.6–6.5)

## 2019-04-13 LAB — LIPID PANEL
Cholesterol: 130 mg/dL (ref 0–200)
HDL: 43.2 mg/dL (ref >39.00)
LDL Cholesterol: 63 mg/dL (ref 0–99)
NonHDL: 86.72
Total CHOL/HDL Ratio: 3
Triglycerides: 119 mg/dL (ref 0.0–149.0)
VLDL: 23.8 mg/dL (ref 0.0–40.0)

## 2019-04-13 NOTE — Patient Instructions (Signed)
Nice to see you. Please check your blood pressure daily for the next 2 weeks and then will have him come in for nurse BP check

## 2019-04-13 NOTE — Progress Notes (Signed)
  [name], MD Phone: [phone]  Dakota Gonzalez is a 74 y.o. male who presents today for f/u.  DIABETES Disease Monitoring: Blood Sugar ranges-110 average, rare excursions to the 180s Polyuria/phagia/dipsia- no      Optho- UTD Medications: Compliance- taking glimeperide, metformin Hypoglycemic symptoms- no  HYPERTENSION  Disease Monitoring  Home BP Monitoring not checking recently, previously was typically 120-130/70s Chest pain- no    Dyspnea- no Medications  Compliance-  Taking lisinopril, amlodipine.  Edema- no    Social History   Tobacco Use  Smoking Status Former Smoker  Smokeless Tobacco Never Used     ROS see history of present illness  Objective  Physical Exam Vitals:   04/13/19 1026  BP: (!) 160/80  Pulse: 100  Temp: (!) 96 F (35.6 C)  SpO2: 97%    BP Readings from Last 3 Encounters:  04/13/19 (!) 160/80  04/09/18 140/90  10/09/17 (!) 160/80   Wt Readings from Last 3 Encounters:  04/13/19 229 lb 12.8 oz (104.2 kg)  01/13/19 234 lb (106.1 kg)  11/26/18 233 lb (105.7 kg)    Physical Exam Constitutional:      General: He is not in acute distress.    Appearance: He is not diaphoretic.  Cardiovascular:     Rate and Rhythm: Normal rate and regular rhythm.     Heart sounds: Normal heart sounds.  Pulmonary:     Effort: Pulmonary effort is normal.     Breath sounds: Normal breath sounds.  Musculoskeletal:     Right lower leg: No edema.     Left lower leg: No edema.  Skin:    General: Skin is warm and dry.  Neurological:     Mental Status: He is alert.    Diabetic Foot Exam - Simple   Simple Foot Form Diabetic Foot exam was performed with the following findings: Yes 04/13/2019 10:53 AM  Visual Inspection See comments: Yes Sensation Testing Intact to touch and monofilament testing bilaterally: Yes Pulse Check Posterior Tibialis and Dorsalis pulse intact bilaterally: Yes Comments Onychomycosis worse on left toes, otherwise no  deformities, ulcerations, or skin breakdown      Assessment/Plan: Please see individual problem list.  HTN (hypertension) Above goal though previously was at goal.  We will have him check daily for the next 2 weeks and then come in for nurse BP check.  Consider altering his regimen at that time.  He will continue his current medication.  Lab work as outlined below.  Diabetes mellitus type 2, controlled ([place]) Seems to be adequately controlled.  Continue current regimen.  Check A1c.   Orders Placed This Encounter  Procedures  . Lipid panel  . HgB A1c  . Comp Met (CMET)    No orders of the defined types were placed in this encounter.   This visit occurred during the SARS-CoV-2 public health emergency.  Safety protocols were in place, including screening questions prior to the visit, additional usage of staff PPE, and extensive cleaning of exam room while observing appropriate contact time as indicated for disinfecting solutions.    [name], MD [place]

## 2019-04-13 NOTE — Assessment & Plan Note (Signed)
Above goal though previously was at goal.  We will have him check daily for the next 2 weeks and then come in for nurse BP check.  Consider altering his regimen at that time.  He will continue his current medication.  Lab work as outlined below.

## 2019-04-13 NOTE — Assessment & Plan Note (Signed)
Seems to be adequately controlled.  Continue current regimen.  Check A1c.

## 2019-04-14 ENCOUNTER — Ambulatory Visit: Payer: Medicare Other | Admitting: Family Medicine

## 2019-04-16 ENCOUNTER — Encounter: Payer: Self-pay | Admitting: Family Medicine

## 2019-04-16 ENCOUNTER — Other Ambulatory Visit: Payer: Self-pay | Admitting: Family Medicine

## 2019-04-16 MED ORDER — GLIMEPIRIDE 4 MG PO TABS: 4.0000 mg | ORAL_TABLET | Freq: Two times a day (BID) | ORAL | 0 refills | Status: DC

## 2019-04-21 ENCOUNTER — Telehealth: Payer: Self-pay

## 2019-04-22 ENCOUNTER — Other Ambulatory Visit: Payer: Self-pay

## 2019-04-22 MED ORDER — EMPAGLIFLOZIN 10 MG PO TABS: 10.0000 mg | ORAL_TABLET | Freq: Every day | ORAL | 2 refills | Status: DC

## 2019-04-22 NOTE — Telephone Encounter (Signed)
Pt called he went to get the Jardiance and it $47. He said that he cant afford that and wanted to know what else he could be put on the cost less.  Ozan Maclay,cma

## 2019-04-22 NOTE — Telephone Encounter (Signed)
Pt called he went to get the Jardiance and it $47. He said that he cant afford that and wanted to know what else he could be put on the cost less

## 2019-04-22 NOTE — Telephone Encounter (Signed)
I think it is worthwhile trying Jardiance.  This should not acutely drop his glucose.  If he does notice more frequent low sugars he should let us know.  If he develops an illness with vomiting, diarrhea, or poor oral intake while on Jardiance he should hold the Jardiance while he has the symptoms and then restart the Jardiance after they resolved.  Thanks.

## 2019-04-23 NOTE — Telephone Encounter (Signed)
Can you contact the pharmacy and see if they can tell us what his insurance would prefer? Thanks.

## 2019-04-23 NOTE — Telephone Encounter (Signed)
Pt called and states that he is going to stick with Jardience because they got the copay down to $8 for thirty day supply. This is a Pharmacist, hospital.  Nastacia Raybuck,cma

## 2019-04-23 NOTE — Telephone Encounter (Signed)
Pt called and states that he is going to stick with Jardience because they got the copay down to $8 for thirty day supply. This is a Pharmacist, hospital

## 2019-04-23 NOTE — Telephone Encounter (Signed)
I called the pharmacy and they suggested that the patient call the insurance to see what the preferred formulary would be and to call us back and let us know, I called and informed the patient and he stated he would call them and let me know.  Caden Fatica,cma

## 2019-04-27 ENCOUNTER — Encounter: Payer: Self-pay | Admitting: Family Medicine

## 2019-04-28 NOTE — Telephone Encounter (Signed)
I called and didi a PA with [employer] and the PA was denied a letter will come by fax for an appeal.  Iyanni Hepp,cma

## 2019-04-28 NOTE — Telephone Encounter (Signed)
Pt states that [employer] faxed over a form about Jardience. It has to be filled out and sent back in order for him to get Medication. [facility] has had it ready for about a week now. Please advise.

## 2019-04-29 NOTE — Telephone Encounter (Signed)
Form for appeal was placed I your signed basket for reason for appealing and signature.  Naylah Cork,cma

## 2019-04-29 NOTE — Telephone Encounter (Signed)
We will await the appeal letter.

## 2019-04-30 ENCOUNTER — Ambulatory Visit (INDEPENDENT_AMBULATORY_CARE_PROVIDER_SITE_OTHER): Payer: Medicare Other

## 2019-04-30 VITALS — Ht 70.0 in | Wt 229.0 lb

## 2019-04-30 DIAGNOSIS — Z0000 Encounter for general adult medical examination without abnormal findings: Secondary | ICD-10-CM | POA: Diagnosis not present

## 2019-04-30 NOTE — Telephone Encounter (Signed)
Signed. Please fax.

## 2019-04-30 NOTE — Patient Instructions (Addendum)
  Dakota Gonzalez , Thank you for taking time to come for your Medicare Wellness Visit. I appreciate your ongoing commitment to your health goals. Please review the following plan we discussed and let me know if I can assist you in the future.   These are the goals we discussed: Goals      Patient Stated   . DIET - REDUCE SUGAR INTAKE (pt-stated)     Low carb diet    . Increase physical activity (pt-stated)     Resume exercise regimen at the gym 2-3 days weekly 45-60 minutes       This is a list of the screening recommended for you and due dates:  Health Maintenance  Topic Date Due  . Flu Shot  08/16/2019  . Hemoglobin A1C  10/14/2019  . Eye exam for diabetics  11/06/2019  . Complete foot exam   04/12/2020  . Cologuard (Stool DNA test)  04/18/2021  . Tetanus Vaccine  03/28/2027  .  Hepatitis C: One time screening is recommended by Center for Disease Control  (CDC) for  adults born from [birth year] through 1964.   Completed  . Pneumonia vaccines  Completed

## 2019-04-30 NOTE — Progress Notes (Signed)
Subjective:   TENOCH MCCLURE is a 74 y.o. male who presents for Medicare Annual/Subsequent preventive examination.  Review of Systems:  No ROS.  Medicare Wellness Virtual Visit.  Visual/audio telehealth visit, UTA vital signs.   Wt/Ht provided.  See social history for additional risk factors.   Cardiac Risk Factors include: advanced age (>42mn, >>67women);male gender;hypertension     Objective:    Vitals: Ht '5\' 10"'  (1.778 m)   Wt 229 lb (103.9 kg)   BMI 32.86 kg/m   Body mass index is 32.86 kg/m.  Advanced Directives 04/30/2019 04/29/2018 04/26/2017 04/25/2016 11/28/2014  Does Patient Have a Medical Advance Directive? Yes Yes Yes No No  Type of AParamedicof ALa VinaLiving will Living will [place]ounty LineLiving will - -  Does patient want to make changes to medical advance directive? No - Patient declined No - Patient declined No - Patient declined - -  Copy of HPisinemoin Chart? No - copy requested - No - copy requested - -  Would patient like information on creating a medical advance directive? - - - No - Patient declined -    Tobacco Social History   Tobacco Use  Smoking Status Former Smoker  Smokeless Tobacco Never Used     Counseling given: Not Answered   Clinical Intake:  Pre-visit preparation completed: Yes        Diabetes: Yes(Followed by pcp)  How often do you need to have someone help you when you read instructions, pamphlets, or other written materials from your doctor or pharmacy?: 1 - Never  Interpreter Needed?: No     Past Medical History:  Diagnosis Date  . Allergy   . Arthritis   . Asthma   . Depression   . Diabetes mellitus 2007  . Enlarged prostate   . GERD (gastroesophageal reflux disease)   . Headache(784.0)   . Hyperlipidemia   . Hypertension   . Migraine   . Sinusitis    Past Surgical History:  Procedure Laterality Date  . BACK SURGERY  10/2003   [facility] . HEMANGIOMA EXCISION  2014   right upper arm, Dr. SRonalee Belts . PROSTATE BIOPSY  2014   Dr. CJacqlyn Larsen . TONSILLECTOMY AND ADENOIDECTOMY  1960   Family History  Problem Relation Age of Onset  . Stroke Mother   . Kidney disease Mother   . Diabetes Mother   . Cancer Father   . Stroke Father   . Kidney disease Father   . Diabetes Sister   . Cancer Other        colon, breast   Social History   Socioeconomic History  . Marital status: Widowed    Spouse name: Not on file  . Number of children: Not on file  . Years of education: Not on file  . Highest education level: Not on file  Occupational History  . Not on file  Tobacco Use  . Smoking status: Former SResearch scientist (life sciences) . Smokeless tobacco: Never Used  Substance and Sexual Activity  . Alcohol use: No  . Drug use: No  . Sexual activity: Never  Other Topics Concern  . 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      Diet - regular   Exercise - ellipitical machine at home  Social Determinants of Health   Financial Resource Strain:   . Difficulty of Paying Living Expenses:   Food Insecurity:   . Worried About Charity fundraiser in the Last Year:   . Arboriculturist in the Last Year:   Transportation Needs:   . Film/video editor (Medical):   Marland Kitchen Lack of Transportation (Non-Medical):   Physical Activity:   . Days of Exercise per Week:   . Minutes of Exercise per Session:   Stress:   . Feeling of Stress :   Social Connections:   . Frequency of Communication with Friends and Family:   . Frequency of Social Gatherings with Friends and Family:   . Attends Religious Services:   . Active Member of Clubs or Organizations:   . Attends Archivist Meetings:   Marland Kitchen Marital Status:     Outpatient Encounter Medications as of 04/30/2019  Medication Sig  . amLODipine (NORVASC) 2.5 MG tablet  TAKE 1 TABLET(2.5 MG) BY MOUTH DAILY  . aspirin 81 MG tablet Take 81 mg by mouth daily.  Marland Kitchen atorvastatin (LIPITOR) 40 MG tablet TAKE 1 TABLET(40 MG) BY MOUTH DAILY  . blood glucose meter kit and supplies One touch ultra, test once daily, E11.9  . Blood Glucose Monitoring Suppl (ONE TOUCH ULTRA SYSTEM KIT) W/DEVICE KIT 1 kit by Does not apply route once.  . Coenzyme Q10 (COQ-10) 100 MG CAPS Take 1 tablet by mouth daily.  . cyclobenzaprine (FLEXERIL) 10 MG tablet Take 1 tablet (10 mg total) by mouth every 8 (eight) hours as needed for muscle spasms (do not drive while using, may make you drowsy).  . empagliflozin (JARDIANCE) 10 MG TABS tablet Take 10 mg by mouth daily before breakfast.  . finasteride (PROSCAR) 5 MG tablet TAKE 1 TABLET(5 MG) BY MOUTH DAILY  . glimepiride (AMARYL) 4 MG tablet Take 1 tablet (4 mg total) by mouth 2 (two) times daily.  Marland Kitchen glucose blood (ONE TOUCH ULTRA TEST) test strip TEST TWICE A DAILY AS DIRECTED  . Lancets Misc. MISC ONETOUCH DELICA LANCETS FINE MISC check blood sugar twice daily  . lisinopril (ZESTRIL) 20 MG tablet TAKE 1 TABLET(20 MG) BY MOUTH DAILY  . metFORMIN (GLUCOPHAGE) 500 MG tablet TAKE 2 TABLETS(1000 MG) BY MOUTH TWICE DAILY WITH A MEAL  . Multiple Vitamins-Minerals (CENTRUM SILVER PO) Take 1 tablet by mouth daily.  . tamsulosin (FLOMAX) 0.4 MG CAPS capsule Take 1 capsule (0.4 mg total) by mouth daily.   No facility-administered encounter medications on file as of 04/30/2019.    Activities of Daily Living In your present state of health, do you have any difficulty performing the following activities: 04/30/2019  Hearing? N  Vision? N  Difficulty concentrating or making decisions? N  Walking or climbing stairs? N  Dressing or bathing? N  Doing errands, shopping? N  Preparing Food and eating ? N  Using the Toilet? N  In the past six months, have you accidently leaked urine? N  Do you have problems with loss of bowel control? N  Managing your  Medications? N  Managing your Finances? N  Housekeeping or managing your Housekeeping? N  Some recent data might be hidden    Patient Care Team: [name], MD as PCP - General (Family Medicine) Wellington Hampshire, MD as Consulting Physician (Cardiology)   Assessment:   This is a routine wellness examination for [place].  Nurse connected with patient 04/30/19 at  9:00 AM EDT by a telephone enabled  telemedicine application and verified that I am speaking with the correct person using two identifiers. Patient stated full name and DOB. Patient gave permission to continue with virtual visit. Patient's location was at home and Nurse's location was at [place] office.   Patient is alert and oriented x3. Patient denies difficulty focusing or concentrating. Patient likes to do Bible Study read and plans to stay active for brain health.  MMSE unable to complete, virtual visit.  6CIT completed through conversation. No issues or concerns.   Health Maintenance Due: -Hgb A1c- 04/13/19 (7.1)  See completed HM at the end of note.   Eye: Visual acuity not assessed. Virtual visit. Followed by their ophthalmologist. Retinopathy- none reported.  Dental: UTD  Hearing: Demonstrates normal hearing during visit.  Safety:  Patient feels safe at home- yes Patient does have smoke detectors at home- yes Patient does wear sunscreen or protective clothing when in direct sunlight - yes Patient does wear seat belt when in a moving vehicle - yes Patient drives- no Adequate lighting in walkways free from debris- yes Grab bars and handrails used as appropriate- yes Ambulates with an assistive device- no  Social: Alcohol intake - no   Smoking history- former  Smokers in home? none Illicit drug use? none  Medication: Started Jardiance this morning before breakfast although uncomfortable adding another medication. Increase in urine output; understands this process can lower blood sugar as well as  dehydrate. Encouraged to stay hydrated. Copay $47; patient states he spoke with insurance and copay can be lowered to $8 once provider submits verification to insurance stating necessity of medication. Patient states he spoke with Cathren Harsh, CMA and she was sending this information to insurance per request. Awaiting feedback.   Pill box in use -yes  Self managed - yes   Covid-19: Precautions and sickness symptoms discussed. Wears mask, social distancing, hand hygiene as appropriate.   Activities of Daily Living Patient denies needing assistance with: household chores, feeding themselves, getting from bed to chair, getting to the toilet, bathing/showering, dressing, managing money, or preparing meals.   Discussed the importance of a healthy diet, water intake and the benefits of aerobic exercise.    Physical activity- active around the home. Plans to start going back to the gym for exercise regularly. Patient states he would like a chance to lower A1c with exercise and a better diet.   Diet:  Modified; tries to have a healthy diet and plans to monitor carb intake closer.  Water: 6 cups daily Caffeine: 1-2 cups daily  Other Providers Patient Care Team: [name], MD as PCP - General (Family Medicine) Wellington Hampshire, MD as Consulting Physician (Cardiology)  Exercise Activities and Dietary recommendations Current Exercise Habits: Home exercise routine, Intensity: Mild  Goals      Patient Stated   . DIET - REDUCE SUGAR INTAKE (pt-stated)     Low carb diet    . Increase physical activity (pt-stated)     Resume exercise regimen at the gym 2-3 days weekly 45-60 minutes       Fall Risk Fall Risk  04/30/2019 04/13/2019 01/13/2019 11/26/2018 04/29/2018  Falls in the past year? 0 0 0 0 0  Number falls in past yr: - 0 0 0 -  Follow up Falls evaluation completed Falls evaluation completed Falls evaluation completed Falls evaluation completed -   Timed Get Up and Go Performed: no,  virtual visit  Depression Screen [facility] 2/9 Scores 04/30/2019 04/13/2019 01/13/2019 11/26/2018  PHQ - 2  Score 0 0 0 0    Cognitive Function MMSE - Mini Mental State Exam 04/25/2016  Orientation to time 5  Orientation to Place 5  Registration 3  Attention/ Calculation 5  Recall 3  Language- name 2 objects 2  Language- repeat 1  Language- follow 3 step command 3  Language- read & follow direction 1  Write a sentence 1  Copy design 1  Total score 30     6CIT Screen 04/30/2019 04/29/2018 04/26/2017  What Year? 0 points 0 points 0 points  What month? 0 points 0 points 0 points  What time? 0 points 0 points 0 points  Count back from 20 - 0 points 0 points  Months in reverse - 0 points 0 points  Repeat phrase - 0 points 0 points  Total Score - 0 0    Immunization History  Administered Date(s) Administered  . Fluad Quad(high Dose 65+) 09/26/2018  . Influenza Split 10/17/2011  . Influenza, High Dose Seasonal PF 09/19/2016, 10/03/2017  . Influenza,inj,Quad PF,6+ Mos 09/22/2012, 10/10/2013, 11/10/2014  . Influenza-Unspecified 11/03/2017  . Moderna SARS-COVID-2 Vaccination 02/27/2019, 03/27/2019  . Pneumococcal Conjugate-13 06/12/2013  . Pneumococcal Polysaccharide-23 10/17/2011  . Tdap 03/20/2017   Screening Tests Health Maintenance  Topic Date Due  . INFLUENZA VACCINE  08/16/2019  . HEMOGLOBIN A1C  10/14/2019  . OPHTHALMOLOGY EXAM  11/06/2019  . FOOT EXAM  04/12/2020  . Fecal DNA (Cologuard)  04/18/2021  . TETANUS/TDAP  03/28/2027  . Hepatitis C Screening  Completed  . PNA vac Low Risk Adult  Completed       Plan:   Keep all routine maintenance appointments.   Follow up 07/14/19 @ 8:00  Medication- Started Jardiance today although uncomfortable adding another medication. Increase in urine output; understands this process can lower blood sugar as well as dehydrate. Encouraged to stay hydrated. Copay $47; patient states he spoke with insurance and copay can be lowered to $8  once provider submits verification stating necessity of medication.  Patient states he spoke with Cathren Harsh, CMA and she was sending this information to insurance per request. Awaiting feedback.   Medicare Attestation I have personally reviewed: The patient's medical and social history Their use of alcohol, tobacco or illicit drugs Their current medications and supplements The patient's functional ability including ADLs,fall risks, home safety risks, cognitive, and hearing and visual impairment Diet and physical activities Evidence for depression   I have reviewed and discussed with patient certain preventive protocols, quality metrics, and best practice recommendations.     Varney Biles, LPN  [id number]

## 2019-04-30 NOTE — Progress Notes (Signed)
I have reviewed the above note and agree.  Nora Sabey, M.D.  

## 2019-05-02 ENCOUNTER — Other Ambulatory Visit: Payer: Self-pay | Admitting: Family Medicine

## 2019-05-04 ENCOUNTER — Other Ambulatory Visit: Payer: Self-pay | Admitting: Family Medicine

## 2019-05-04 NOTE — Telephone Encounter (Signed)
Faxed the appeal to [facility] today.  Aneisa Karren,cma

## 2019-05-11 ENCOUNTER — Telehealth: Payer: Self-pay

## 2019-05-11 ENCOUNTER — Telehealth: Payer: Self-pay | Admitting: Family Medicine

## 2019-05-11 DIAGNOSIS — E1159 Type 2 diabetes mellitus with other circulatory complications: Secondary | ICD-10-CM

## 2019-05-11 NOTE — Telephone Encounter (Signed)
Referral placed.

## 2019-05-11 NOTE — Telephone Encounter (Signed)
Drug

## 2019-05-15 ENCOUNTER — Encounter: Payer: Self-pay | Admitting: Family Medicine

## 2019-05-19 NOTE — Telephone Encounter (Signed)
Pt called to inquire about referral for Jardiance. Informed pt that referral had been placed and he states that he only has about seven days left of medication. He states that he will call and check on that today.

## 2019-05-22 ENCOUNTER — Telehealth: Payer: Self-pay | Admitting: Family Medicine

## 2019-05-22 NOTE — Chronic Care Management (AMB) (Signed)
  Chronic Care Management   Note  05/22/2019 Name: Dakota Gonzalez MRN: [id number] DOB: [birth year]  Dakota Gonzalez is a 74 y.o. year old male who is a primary care patient of Caryl Bis, Angela Adam, MD. I reached out to Laurell Roof by phone today in response to a referral sent by Mr. Dakota Gonzalez PCP, Dr. [name]     [name] was given information about Chronic Care Management services today including:  1. CCM service includes personalized support from designated clinical staff supervised by his physician, including individualized plan of care and coordination with other care providers 2. 24/7 contact phone numbers for assistance for urgent and routine care needs. 3. Service will only be billed when office clinical staff spend 20 minutes or more in a month to coordinate care. 4. Only one practitioner may furnish and bill the service in a calendar month. 5. The patient may stop CCM services at any time (effective at the end of the month) by phone call to the office staff. 6. The patient will be responsible for cost sharing (co-pay) of up to 20% of the service fee (after annual deductible is met).  Patient agreed to services and verbal consent obtained.   Follow up plan: Telephone appointment with care management team member scheduled for:07/09/2019  Glenna Durand, LPN Health Advisor, [place] Management ??[email or web address] ??[phone]

## 2019-05-25 ENCOUNTER — Ambulatory Visit (INDEPENDENT_AMBULATORY_CARE_PROVIDER_SITE_OTHER): Payer: Medicare Other | Admitting: Pharmacist

## 2019-05-25 DIAGNOSIS — I2510 Atherosclerotic heart disease of native coronary artery without angina pectoris: Secondary | ICD-10-CM | POA: Diagnosis not present

## 2019-05-25 DIAGNOSIS — I10 Essential (primary) hypertension: Secondary | ICD-10-CM

## 2019-05-25 DIAGNOSIS — E1165 Type 2 diabetes mellitus with hyperglycemia: Secondary | ICD-10-CM

## 2019-05-25 MED ORDER — GLIMEPIRIDE 4 MG PO TABS: 2.0000 mg | ORAL_TABLET | Freq: Two times a day (BID) | ORAL | 2 refills | Status: DC

## 2019-05-25 NOTE — Chronic Care Management (AMB) (Signed)
Chronic Care Management   Note  05/25/2019 Name: Dakota Gonzalez MRN: [id number] DOB: 06-24-1945   Subjective:  Dakota Gonzalez is a 74 y.o. year old male who is a primary care patient of Caryl Bis, Angela Adam, MD. The CCM team was consulted for assistance with chronic disease management and care coordination needs.    Contacted patient for medication access needs.  Review of patient status, including review of consultants reports, laboratory and other test data, was performed as part of comprehensive evaluation and provision of chronic care management services.   SDOH (Social Determinants of Health) assessments and interventions performed: yes    Objective:  Lab Results  Component Value Date   CREATININE 0.95 04/13/2019   CREATININE 0.88 08/28/2018   CREATININE 0.99 04/09/2018    Lab Results  Component Value Date   HGBA1C 7.1 (H) 04/13/2019       Component Value Date/Time   CHOL 130 04/13/2019 1058   TRIG 119.0 04/13/2019 1058   HDL 43.20 04/13/2019 1058   CHOLHDL 3 04/13/2019 1058   VLDL 23.8 04/13/2019 1058   LDLCALC 63 04/13/2019 1058   LDLDIRECT 70.0 08/29/2016 0940    Clinical ASCVD: No  The ASCVD Risk score Mikey Bussing DC Jr., et al., 2013) failed to calculate for the following reasons:   The systolic blood pressure is missing    BP Readings from Last 3 Encounters:  04/13/19 (!) 160/80  04/09/18 140/90  10/09/17 (!) 160/80    Allergies  Allergen Reactions  . Aspartame Other (See Comments)    Medications Reviewed Today    Reviewed by De Hollingshead, [facility] (Pharmacist) on 05/25/19 at [phone]  Med List Status: <None>  Medication Order Taking? Sig Documenting Provider Last Dose Status Informant  amLODipine (NORVASC) 2.5 MG tablet [id number] Yes TAKE 1 TABLET(2.5 MG) BY MOUTH DAILY [name], MD Taking Active   aspirin 81 MG tablet [id number] Yes Take 81 mg by mouth daily. [provider] Taking Active   atorvastatin (LIPITOR) 40 MG tablet  [id number] Yes TAKE 1 TABLET(40 MG) BY MOUTH DAILY [name], MD Taking Active   blood glucose meter kit and supplies [id number] Yes One touch ultra, test once daily, E11.9 [name], MD Taking Active         Discontinued 05/25/19 0850 (Completed Course)   Coenzyme Q10 (COQ-10) 100 MG CAPS [id number] Yes Take 1 tablet by mouth daily. [provider] Taking Active   cyclobenzaprine (FLEXERIL) 10 MG tablet [id number] No Take 1 tablet (10 mg total) by mouth every 8 (eight) hours as needed for muscle spasms (do not drive while using, may make you drowsy).  Patient not taking: Reported on 05/25/2019   Jackolyn Confer, MD Not Taking Active   empagliflozin (JARDIANCE) 10 MG TABS tablet [id number] Yes Take 10 mg by mouth daily before breakfast. [name], MD Taking Active   finasteride (PROSCAR) 5 MG tablet [id number] Yes TAKE 1 TABLET(5 MG) BY MOUTH DAILY [name], MD Taking Active   glimepiride (AMARYL) 4 MG tablet [id number] Yes Take 1 tablet (4 mg total) by mouth 2 (two) times daily. [name], MD Taking Active   glucose blood (ONE TOUCH ULTRA TEST) test strip [id number] Yes TEST TWICE A DAILY AS DIRECTED [name], MD Taking Active   Lancets Misc. [place] [id number] Yes ONETOUCH DELICA LANCETS FINE MISC check blood sugar twice daily [name], MD Taking Active   lisinopril (ZESTRIL) 20 MG tablet [id number] Yes TAKE  1 TABLET(20 MG) BY MOUTH DAILY [name], MD Taking Active   metFORMIN (GLUCOPHAGE) 500 MG tablet [id number] Yes TAKE 2 TABLETS(1000 MG) BY MOUTH TWICE DAILY WITH A MEAL [name], MD Taking Active   Multiple Vitamins-Minerals (CENTRUM SILVER PO) [id number] Yes Take 1 tablet by mouth daily. [provider] Taking Active   tamsulosin (FLOMAX) 0.4 MG CAPS capsule [id number] Yes Take 1 capsule (0.4 mg total) by mouth daily. [name], MD Taking Active            Assessment:   Goals Addressed             This Visit's Progress     Patient Stated   . PharmD "I can't afford this medication" (pt-stated)       CARE PLAN ENTRY (see longtitudinal plan of care for additional care plan information)  Current Barriers:  . Diabetes: uncontrolled; complicated by chronic medical conditions including HTN, HLD, BPH most recent A1c 7.1% o Patient notes tolerability of Jardiance, denies GU infections, does note that his BP has been better controlled w/ Jardiance addition . Most recent eGFR: ~77 ml/min . Current antihyperglycemic regimen: metformin 1000 mg BID, glimepiride 4 mg BID, Jardiance 10 mg (x1 month) . Reports episodes of hypoglycemia in the mid-afternoon - episodes in the 60s  . Current meal patterns: o Breakfast: Glucerna . Current blood glucose readings:  o Before lunch: ~110 o Before supper: ~80 . Cardiovascular risk reduction: o Current hypertensive regimen: amlodipine 2.5 mg daily, lisinopril 20 mg daily o Current hyperlipidemia regimen: atorvastatin 40 mg; LDL at goal <70 o Current antiplatelet regimen: ASA 81 mg daily   Pharmacist Clinical Goal(s):  Marland Kitchen Over the next 90 days, patient will work with PharmD and primary care provider to address optimized glucose readings  Interventions: . Comprehensive medication review performed, medication list updated in electronic medical record . Inter-disciplinary care team collaboration (see longitudinal plan of care) . Decrease glimepiride to 2 mg BID. Patient will split tablets, and will let us know if they are too small to split and we can send a new prescription for 2 mg tablet strength  . Reviewed income. Patient should qualify for BI assistance. Received patient portion and income information, as well as provider information. Submitted to [employer]. Will pass along to CPhT for follow up.  . Moving forward, will plan to maximize Jardiance and hopefully eliminate glimepiride. Reviewed risk of hypoglycemia and weight gain with patient.    Patient Self Care Activities:  . Patient will check blood glucose BID, document, and provide at future appointments . Patient will take medications as prescribed . Patient will report any questions or concerns to provider   Initial goal documentation        Plan: - Rescheduled f/u call in ~ 4 weeks  Catie Darnelle Maffucci, PharmD, [place], [place] Pharmacist [place] [phone]

## 2019-05-25 NOTE — Patient Instructions (Signed)
Visit Information  Goals Addressed            This Visit's Progress     Patient Stated   . PharmD "I can't afford this medication" (pt-stated)       CARE PLAN ENTRY (see longtitudinal plan of care for additional care plan information)  Current Barriers:  . Diabetes: uncontrolled; complicated by chronic medical conditions including HTN, HLD, BPH most recent A1c 7.1% o Patient notes tolerability of Jardiance, denies GU infections, does note that his BP has been better controlled w/ Jardiance addition . Most recent eGFR: ~77 ml/min . Current antihyperglycemic regimen: metformin 1000 mg BID, glimepiride 4 mg BID, Jardiance 10 mg (x1 month) . Reports episodes of hypoglycemia in the mid-afternoon - episodes in the 60s  . Current meal patterns: o Breakfast: Glucerna . Current blood glucose readings:  o Before lunch: ~110 o Before supper: ~80 . Cardiovascular risk reduction: o Current hypertensive regimen: amlodipine 2.5 mg daily, lisinopril 20 mg daily o Current hyperlipidemia regimen: atorvastatin 40 mg; LDL at goal <70 o Current antiplatelet regimen: ASA 81 mg daily   Pharmacist Clinical Goal(s):  Marland Kitchen Over the next 90 days, patient will work with PharmD and primary care provider to address optimized glucose readings  Interventions: . Comprehensive medication review performed, medication list updated in electronic medical record . Inter-disciplinary care team collaboration (see longitudinal plan of care) . Decrease glimepiride to 2 mg BID. Patient will split tablets, and will let us know if they are too small to split and we can send a new prescription for 2 mg tablet strength  . Reviewed income. Patient should qualify for BI assistance. Received patient portion and income information, as well as provider information. Submitted to [employer]. Will pass along to CPhT for follow up.  . Moving forward, will plan to maximize Jardiance and hopefully eliminate glimepiride. Reviewed risk of  hypoglycemia and weight gain with patient.   Patient Self Care Activities:  . Patient will check blood glucose BID, document, and provide at future appointments . Patient will take medications as prescribed . Patient will report any questions or concerns to provider   Initial goal documentation        Patient verbalizes understanding of instructions provided today.   Plan: - Rescheduled f/u call in ~ 4 weeks  Catie Darnelle Maffucci, PharmD, [place], [place] Pharmacist [place] [phone]

## 2019-06-01 ENCOUNTER — Ambulatory Visit: Payer: Self-pay | Admitting: Pharmacist

## 2019-06-01 ENCOUNTER — Other Ambulatory Visit: Payer: Self-pay | Admitting: Pharmacy Technician

## 2019-06-01 NOTE — Chronic Care Management (AMB) (Signed)
  Chronic Care Management   Note  06/01/2019 Name: Dakota Gonzalez MRN: [id number] DOB: 04/30/1945  Dakota Gonzalez is a 74 y.o. year old male who is a primary care patient of Caryl Bis, Angela Adam, MD. The CCM team was consulted for assistance with chronic disease management and care coordination needs.    Received fax that patient had been denied for BI assistance for Jardiance due to "having private insurance". Patient does not have private insurance, he has Medicare Part D. Will collaborate w/ CPhT to correct and refax.   Catie Darnelle Maffucci, PharmD, [place], CPP Clinical Pharmacist [place] [phone]

## 2019-06-01 NOTE — Patient Outreach (Signed)
[place] [facility]) Care Management  06/01/2019  Dakota Gonzalez 31-Dec-1945 [id number]  Received fax from embedded pharmacist Catie Darnelle Maffucci that patient had been denied with BI Cares for Jardiance due to having private insurance.  After review the application it was determine that the wrong box was checked.  Care coordination call placed to BI in regards to the application.  Spoke to [place] and informed the patient must have been confused about the type of insurance he has. Informed her that patient has [facility] Medicare complete and not private insurance.A copy of the insurance card was included when the application was faxed.  She informed she would outreach patient to verify this information . Once the information was able to be verified, then they will re process his application.  Will follow up with BI as previously scheduled. Will also route note to [place] as Juluis Rainier.  Shanay Woolman P. Candis Kabel, [place]  [phone]

## 2019-06-02 ENCOUNTER — Other Ambulatory Visit: Payer: Self-pay | Admitting: Pharmacy Technician

## 2019-06-02 NOTE — Patient Outreach (Signed)
[facility]) Care Management  06/02/2019  Dakota Gonzalez 02-15-1945 [id number]   Received notification from embedded [facility] R[facility] Catie Darnelle Maffucci that patient was denied due to having private insurance.   Upon review of his application it was noted that patient check the incorrect boxes relating to private vs medicare part D. Corrected application and re faxed to BI.  Will follow up with BI in 5-10 business days.  Jill P. Simcox, [place]  [phone]

## 2019-06-03 ENCOUNTER — Other Ambulatory Visit: Payer: Self-pay | Admitting: Pharmacy Technician

## 2019-06-03 NOTE — Patient Outreach (Signed)
[facility]) Care Management  06/03/2019  Dakota Gonzalez August 30, 1945 [id number]  Care coordination call placed to [place] in regards to patient's Jardiance.  Spoke to [place] who informed the application is still showing as Denied due to patient having private insurance. She informed a call was placed to thae patient on 06/01/2019 but there was no answer and a voicemail was not set up and therefore a voicemail was not able to be left. Informed an updated application was also faxed over to [facility] yesterday.  She informed if patient could call them at [id number] and update this information, then the processing could resume. She informed she spoke to her supervisor to inquire if they could take a verbal from someone other than the patient was was told the change would have to come from the patient.  Will outreach embedded [place] for assistance.  [name], [place]  [phone]

## 2019-06-04 ENCOUNTER — Ambulatory Visit: Payer: Self-pay | Admitting: Pharmacist

## 2019-06-04 ENCOUNTER — Encounter: Payer: Self-pay | Admitting: Pharmacist

## 2019-06-04 NOTE — Chronic Care Management (AMB) (Signed)
  Chronic Care Management   Note  06/04/2019 Name: [name] MRN: [id number] DOB: 1945/03/27  Dakota Gonzalez is a 74 y.o. year old male who is a primary care patient of Caryl Bis, Angela Adam, MD. The CCM team was consulted for assistance with chronic disease management and care coordination needs.    Contacted patient to f/u on BI application for Jardiance. Per CPhT, patient needs to call and update that he has Medicare, not commercial/private insurance. Will also send MyChart message with this information.  Catie Darnelle Maffucci, PharmD, [place], CPP Clinical Pharmacist [place] [phone]

## 2019-06-05 ENCOUNTER — Other Ambulatory Visit: Payer: Self-pay | Admitting: Pharmacy Technician

## 2019-06-05 ENCOUNTER — Telehealth: Payer: Self-pay

## 2019-06-05 NOTE — Patient Outreach (Signed)
[facility]) Care Management  06/05/2019  Dakota Gonzalez February 01, 1945 [id number]  Care coordination call placed to BI in regards to patient's Jardiance application.  Spoke to [place] who informed patient was APPROVED 06/04/2019-01/15/2020. Randall Hiss informed the medication was ordered on 06/04/2019 and is being shipped today 06/05/2019. He informed patient should receive the medication in the next 5-7 business days.  Will follow up with patient in 5-10 business days to inquire if medication was received and to discuss the refill procedure.  Dakota Gonzalez P. Swati Granberry, [place]  [phone]

## 2019-06-05 NOTE — Telephone Encounter (Signed)
Noted. Do we need to submit anything else for him to receive the meds? Or will they just go directly to him?

## 2019-06-05 NOTE — Telephone Encounter (Signed)
Nope, the prescription is already being processed and should ship to him in the next 7-10 business days

## 2019-06-12 ENCOUNTER — Encounter: Payer: Self-pay | Admitting: Family Medicine

## 2019-06-17 ENCOUNTER — Other Ambulatory Visit: Payer: Self-pay | Admitting: Pharmacy Technician

## 2019-06-17 NOTE — Patient Outreach (Signed)
[facility]) Care Management  06/17/2019  Dakota Gonzalez [birth year] [id number]  ADDENDUM   Incoming call from patient regarding patient assistance medication delivery of Jardiance with BI, HIPAA identifiers verified.   Patient confirmed receiving the medication mentioned above. Discussed refill procedure with patient which will require him to call BI approximately 14-21 days before running out of medication. Informed patient where to find the phone number to call either on the pharmacy label on the medication bottle or in the paperwork. Patient verbalized understanding.  Patient inquired if the glimepiride dose could be called in as a smaller strength tablet instead of him having to cut the higher strength in half. Informed patient I would outreach embedded [facility] RPh Catie Darnelle Maffucci as a reminder to discuss this at the call he has scheduled with her on 06/25/2019. Patient was agreeable to this plan. In basket message was sent to pharmacist named above. Confirmed patient had name and number.  Follow up:  Will route note to embedded [place] for case closure as patient assistance is completed.  Selene Peltzer P. Piedad Standiford, [place]  [phone]

## 2019-06-17 NOTE — Patient Outreach (Signed)
[place] [facility]) Care Management  06/17/2019  TRUSTYN EWERT 1945-08-21 [id number]    Unsuccessful call placed to patient regarding patient assistance medication delivery of Jardiance from [place], HIPAA compliant voicemail left.   Per chart review the following was found:    06/12/19 2:33 PM Dr. Caryl Bis, I received my first shipment of 90 days of Jardiance in todays mail. Thank you and the pharmacy for making this happen. Please let the pharmacist that I received it, and also my sugar has been ok since I cut my Glimepiride tablets to half tablet twice a day, my blood pressures have come down. Thanks, Christhoper Serrette    Was calling to discuss refill procedure with patient.  Follow up:  Will route note to embedded RPh Catie Darnelle Maffucci to inform patient on refill procedure at next [facility] office visit. Will close case and remove myself from care team as patient assistance is completed.  Othella Slappey P. Kenia Teagarden, [place]  [phone]

## 2019-06-25 ENCOUNTER — Ambulatory Visit (INDEPENDENT_AMBULATORY_CARE_PROVIDER_SITE_OTHER): Payer: Medicare Other | Admitting: Pharmacist

## 2019-06-25 DIAGNOSIS — E1159 Type 2 diabetes mellitus with other circulatory complications: Secondary | ICD-10-CM | POA: Diagnosis not present

## 2019-06-25 DIAGNOSIS — I10 Essential (primary) hypertension: Secondary | ICD-10-CM

## 2019-06-25 NOTE — Patient Instructions (Signed)
Visit Information  Goals Addressed              This Visit's Progress     Patient Stated   .  PharmD "I can't afford this medication" (pt-stated)        CARE PLAN ENTRY (see longtitudinal plan of care for additional care plan information)  Current Barriers:  . Diabetes: uncontrolled; complicated by chronic medical conditions including HTN, HLD, BPH most recent A1c 7.1% o Reports being pleased with impact of Jardiance on BG and BP o Reports glimepiride tablets are difficult to split . Most recent eGFR: ~77 ml/min . Current antihyperglycemic regimen: metformin 1000 mg BID, glimepiride 2 mg BID, Jardiance 10 mg  o APPROVED for Jardiance assistance through [place] through 01/15/20 . Reports 1 episodes of hypoglycemia in the mid-afternoon on Tuesday . Current blood glucose readings:  o Before lunch: ~ 70-100s o Before supper: ~80-110s o 2 hours after meals: ~140-150s . Current exercise:  o Exercises daily at home; has a treadmill and weights at home  . Cardiovascular risk reduction: o Current hypertensive regimen: amlodipine 2.5 mg daily, lisinopril 20 mg daily - Home BP readings: 115/63; 109/56, 110/60. Denies any s/sx lightheadedness, dizziness  o Current hyperlipidemia regimen: atorvastatin 40 mg; LDL at goal <70 o Current antiplatelet regimen: ASA 81 mg daily   Pharmacist Clinical Goal(s):  Marland Kitchen Over the next 90 days, patient will work with PharmD and primary care provider to address optimized glucose readings  Interventions: . Comprehensive medication review performed, medication list updated in electronic medical record . Inter-disciplinary care team collaboration (see longitudinal plan of care) . Discontinue glimepiride to reduce risk of hypoglycemia. Discussed that if premeals trend to >130 or post meals >180, call me, and we can discuss increasing Jardiance.  . Continue checking BG daily and documenting.  . Discussed improvement in BP. Continue to document. May consider d/c of  amlodipine moving forward, particularly if s/sx hypotension develop  Patient Self Care Activities:  . Patient will check blood glucose BID, document, and provide at future appointments . Patient will take medications as prescribed . Patient will report any questions or concerns to provider   Please see past updates related to this goal by clicking on the "Past Updates" button in the selected goal         Patient verbalizes understanding of instructions provided today.   Plan:  - Scheduled f/u call in ~ 8-9 weeks  Catie Darnelle Maffucci, PharmD, [place], [place] Pharmacist [place] [phone]

## 2019-06-25 NOTE — Chronic Care Management (AMB) (Signed)
Chronic Care Management   Follow Up Note   06/25/2019 Name: Dakota Gonzalez MRN: [id number] DOB: [birth year]  Referred by: [name], MD Reason for referral : Chronic Care Management (Medication Mangagement)   Dakota Gonzalez is a 74 y.o. year old male who is a primary care patient of Caryl Bis, Angela Adam, MD. The CCM team was consulted for assistance with chronic disease management and care coordination needs.    Contacted patient for medication management review.   Review of patient status, including review of consultants reports, relevant laboratory and other test results, and collaboration with appropriate care team members and the patient's provider was performed as part of comprehensive patient evaluation and provision of chronic care management services.    SDOH (Social Determinants of Health) assessments performed: No See Care Plan activities for detailed interventions related to [facility])     Outpatient Encounter Medications as of 06/25/2019  Medication Sig  . amLODipine (NORVASC) 2.5 MG tablet TAKE 1 TABLET(2.5 MG) BY MOUTH DAILY  . aspirin 81 MG tablet Take 81 mg by mouth daily.  Marland Kitchen atorvastatin (LIPITOR) 40 MG tablet TAKE 1 TABLET(40 MG) BY MOUTH DAILY  . empagliflozin (JARDIANCE) 10 MG TABS tablet Take 10 mg by mouth daily before breakfast.  . glimepiride (AMARYL) 4 MG tablet Take 0.5 tablets (2 mg total) by mouth 2 (two) times daily.  Marland Kitchen lisinopril (ZESTRIL) 20 MG tablet TAKE 1 TABLET(20 MG) BY MOUTH DAILY  . metFORMIN (GLUCOPHAGE) 500 MG tablet TAKE 2 TABLETS(1000 MG) BY MOUTH TWICE DAILY WITH A MEAL  . blood glucose meter kit and supplies One touch ultra, test once daily, E11.9  . Coenzyme Q10 (COQ-10) 100 MG CAPS Take 1 tablet by mouth daily.  . cyclobenzaprine (FLEXERIL) 10 MG tablet Take 1 tablet (10 mg total) by mouth every 8 (eight) hours as needed for muscle spasms (do not drive while using, may make you drowsy). (Patient not taking: Reported on 05/25/2019)  .  finasteride (PROSCAR) 5 MG tablet TAKE 1 TABLET(5 MG) BY MOUTH DAILY  . glucose blood (ONE TOUCH ULTRA TEST) test strip TEST TWICE A DAILY AS DIRECTED  . Lancets Misc. MISC ONETOUCH DELICA LANCETS FINE MISC check blood sugar twice daily  . Multiple Vitamins-Minerals (CENTRUM SILVER PO) Take 1 tablet by mouth daily.  . tamsulosin (FLOMAX) 0.4 MG CAPS capsule Take 1 capsule (0.4 mg total) by mouth daily.   No facility-administered encounter medications on file as of 06/25/2019.     Objective:   Goals Addressed              This Visit's Progress     Patient Stated   .  PharmD "I can't afford this medication" (pt-stated)        CARE PLAN ENTRY (see longtitudinal plan of care for additional care plan information)  Current Barriers:  . Diabetes: uncontrolled; complicated by chronic medical conditions including HTN, HLD, BPH most recent A1c 7.1% o Reports being pleased with impact of Jardiance on BG and BP o Reports glimepiride tablets are difficult to split . Most recent eGFR: ~77 ml/min . Current antihyperglycemic regimen: metformin 1000 mg BID, glimepiride 2 mg BID, Jardiance 10 mg  o APPROVED for Jardiance assistance through [place] through 01/15/20 . Reports 1 episodes of hypoglycemia in the mid-afternoon on Tuesday . Current blood glucose readings:  o Before lunch: ~ 70-100s o Before supper: ~80-110s o 2 hours after meals: ~140-150s . Current exercise:  o Exercises daily at home; has a treadmill and weights  at home  . Cardiovascular risk reduction: o Current hypertensive regimen: amlodipine 2.5 mg daily, lisinopril 20 mg daily - Home BP readings: 115/63; 109/56, 110/60. Denies any s/sx lightheadedness, dizziness  o Current hyperlipidemia regimen: atorvastatin 40 mg; LDL at goal <70 o Current antiplatelet regimen: ASA 81 mg daily   Pharmacist Clinical Goal(s):  Marland Kitchen Over the next 90 days, patient will work with PharmD and primary care provider to address optimized glucose  readings  Interventions: . Comprehensive medication review performed, medication list updated in electronic medical record . Inter-disciplinary care team collaboration (see longitudinal plan of care) . Discontinue glimepiride to reduce risk of hypoglycemia. Discussed that if premeals trend to >130 or post meals >180, call me, and we can discuss increasing Jardiance.  . Continue checking BG daily and documenting.  . Discussed improvement in BP. Continue to document. May consider d/c of amlodipine moving forward, particularly if s/sx hypotension develop  Patient Self Care Activities:  . Patient will check blood glucose BID, document, and provide at future appointments . Patient will take medications as prescribed . Patient will report any questions or concerns to provider   Please see past updates related to this goal by clicking on the "Past Updates" button in the selected goal          Plan:  - Scheduled f/u call in ~ 8-9 weeks  Catie Darnelle Maffucci, PharmD, [place], [place] Pharmacist [place] [phone]

## 2019-07-03 ENCOUNTER — Encounter: Payer: Self-pay | Admitting: Family Medicine

## 2019-07-06 ENCOUNTER — Ambulatory Visit: Payer: Medicare Other | Admitting: Pharmacist

## 2019-07-06 DIAGNOSIS — E1159 Type 2 diabetes mellitus with other circulatory complications: Secondary | ICD-10-CM

## 2019-07-06 NOTE — Chronic Care Management (AMB) (Signed)
Chronic Care Management   Follow Up Note   07/06/2019 Name: Dakota Gonzalez MRN: [id number] DOB: June 21, 1945  Referred by: [name], MD Reason for referral : Chronic Care Management (Medication Management)   Dakota Gonzalez is a 74 y.o. year old male who is a primary care patient of Caryl Bis, Angela Adam, MD. The CCM team was consulted for assistance with chronic disease management and care coordination needs.    Review of patient status, including review of consultants reports, relevant laboratory and other test results, and collaboration with appropriate care team members and the patient's provider was performed as part of comprehensive patient evaluation and provision of chronic care management services.    SDOH (Social Determinants of Health) assessments performed: No See Care Plan activities for detailed interventions related to [facility])     Outpatient Encounter Medications as of 07/06/2019  Medication Sig  . amLODipine (NORVASC) 2.5 MG tablet TAKE 1 TABLET(2.5 MG) BY MOUTH DAILY  . aspirin 81 MG tablet Take 81 mg by mouth daily.  Marland Kitchen atorvastatin (LIPITOR) 40 MG tablet TAKE 1 TABLET(40 MG) BY MOUTH DAILY  . blood glucose meter kit and supplies One touch ultra, test once daily, E11.9  . Coenzyme Q10 (COQ-10) 100 MG CAPS Take 1 tablet by mouth daily.  . cyclobenzaprine (FLEXERIL) 10 MG tablet Take 1 tablet (10 mg total) by mouth every 8 (eight) hours as needed for muscle spasms (do not drive while using, may make you drowsy). (Patient not taking: Reported on 05/25/2019)  . empagliflozin (JARDIANCE) 10 MG TABS tablet Take 10 mg by mouth daily before breakfast.  . finasteride (PROSCAR) 5 MG tablet TAKE 1 TABLET(5 MG) BY MOUTH DAILY  . glucose blood (ONE TOUCH ULTRA TEST) test strip TEST TWICE A DAILY AS DIRECTED  . Lancets Misc. MISC ONETOUCH DELICA LANCETS FINE MISC check blood sugar twice daily  . lisinopril (ZESTRIL) 20 MG tablet TAKE 1 TABLET(20 MG) BY MOUTH DAILY  . metFORMIN  (GLUCOPHAGE) 500 MG tablet TAKE 2 TABLETS(1000 MG) BY MOUTH TWICE DAILY WITH A MEAL  . Multiple Vitamins-Minerals (CENTRUM SILVER PO) Take 1 tablet by mouth daily.  . tamsulosin (FLOMAX) 0.4 MG CAPS capsule Take 1 capsule (0.4 mg total) by mouth daily.   No facility-administered encounter medications on file as of 07/06/2019.     Objective:   Goals Addressed              This Visit's Progress     Patient Stated   .  PharmD "I can't afford this medication" (pt-stated)        CARE PLAN ENTRY (see longtitudinal plan of care for additional care plan information)  Current Barriers:  . Diabetes: uncontrolled; complicated by chronic medical conditions including HTN, HLD, BPH most recent A1c 7.1% o Patient called me, concerned about blood sugars increasing after glimepiride discontinuation . Most recent eGFR: ~77 ml/min . Current antihyperglycemic regimen: metformin 1000 mg BID, Jardiance 10 mg  o APPROVED for Jardiance assistance through [place] through 01/15/20 . Reports 1 episodes of hypoglycemia in the mid-afternoon on Tuesday . Current blood glucose readings:  o Fasting: 130 this morning, 115-130s  o Late afternoon: 150-160s . Cardiovascular risk reduction: o Current hypertensive regimen: amlodipine 2.5 mg daily, lisinopril 20 mg daily - Home BP readings: 115/63; 109/56, 110/60. Denies any s/sx lightheadedness, dizziness  o Current hyperlipidemia regimen: atorvastatin 40 mg; LDL at goal <70 o Current antiplatelet regimen: ASA 81 mg daily   Pharmacist Clinical Goal(s):  Marland Kitchen Over the  next 90 days, patient will work with PharmD and primary care provider to address optimized glucose readings  Interventions: . Comprehensive medication review performed, medication list updated in electronic medical record . Inter-disciplinary care team collaboration (see longitudinal plan of care) . Reiterated that goal fasting is <130 and goal 2 hour post prandial is 180, so the readings he is reporting  are within goal. However, could also be appropriate to increase Jardiance to 25 mg daily. Offered either option to patient. He noted that he will continue current regimen and discuss w/ Dr. Caryl Bis at appointment next week  Patient Self Care Activities:  . Patient will check blood glucose BID, document, and provide at future appointments . Patient will take medications as prescribed . Patient will report any questions or concerns to provider   Please see past updates related to this goal by clicking on the "Past Updates" button in the selected goal          Plan:  - Will f/u as previously scheduled  Catie Darnelle Maffucci, PharmD, [place], [place] Pharmacist [place] [phone]

## 2019-07-06 NOTE — Patient Instructions (Addendum)
Visit Information  Goals Addressed              This Visit's Progress     Patient Stated   .  PharmD "I can't afford this medication" (pt-stated)        CARE PLAN ENTRY (see longtitudinal plan of care for additional care plan information)  Current Barriers:  . Diabetes: uncontrolled; complicated by chronic medical conditions including HTN, HLD, BPH most recent A1c 7.1% o Patient called me, concerned about blood sugars increasing after glimepiride discontinuation . Most recent eGFR: ~77 ml/min . Current antihyperglycemic regimen: metformin 1000 mg BID, Jardiance 10 mg  o APPROVED for Jardiance assistance through [place] through 01/15/20 . Reports 1 episodes of hypoglycemia in the mid-afternoon on Tuesday . Current blood glucose readings:  o Fasting: 130 this morning, 115-130s  o Late afternoon: 150-160s . Cardiovascular risk reduction: o Current hypertensive regimen: amlodipine 2.5 mg daily, lisinopril 20 mg daily - Home BP readings: 115/63; 109/56, 110/60. Denies any s/sx lightheadedness, dizziness  o Current hyperlipidemia regimen: atorvastatin 40 mg; LDL at goal <70 o Current antiplatelet regimen: ASA 81 mg daily   Pharmacist Clinical Goal(s):  Marland Kitchen Over the next 90 days, patient will work with PharmD and primary care provider to address optimized glucose readings  Interventions: . Comprehensive medication review performed, medication list updated in electronic medical record . Inter-disciplinary care team collaboration (see longitudinal plan of care) . Reiterated that goal fasting is <130 and goal 2 hour post prandial is 180, so the readings he is reporting are within goal. However, could also be appropriate to increase Jardiance to 25 mg daily. Offered either option to patient. He noted that he will continue current regimen and discuss w/ Dr. Caryl Bis at appointment next week  Patient Self Care Activities:  . Patient will check blood glucose BID, document, and provide at future  appointments . Patient will take medications as prescribed . Patient will report any questions or concerns to provider   Please see past updates related to this goal by clicking on the "Past Updates" button in the selected goal         The patient verbalized understanding of instructions provided today and declined a print copy of patient instruction materials.   Plan:  - Will f/u as previously scheduled  Catie Darnelle Maffucci, PharmD, [place], [place] Pharmacist [place] [phone]

## 2019-07-06 NOTE — Chronic Care Management (AMB) (Signed)
Chronic Care Management   Follow Up Note   07/06/2019 Name: Dakota Gonzalez MRN: [id number] DOB: 1945/04/27  Referred by: [name], MD Reason for referral : Chronic Care Management (Medication Management)   Dakota Gonzalez is a 74 y.o. year old male who is a primary care patient of Caryl Bis, Angela Adam, MD. The CCM team was consulted for assistance with chronic disease management and care coordination needs.    Received message from patient regarding BG readings.   Review of patient status, including review of consultants reports, relevant laboratory and other test results, and collaboration with appropriate care team members and the patient's provider was performed as part of comprehensive patient evaluation and provision of chronic care management services.    SDOH (Social Determinants of Health) assessments performed: No See Care Plan activities for detailed interventions related to [facility])     Outpatient Encounter Medications as of 07/06/2019  Medication Sig  . amLODipine (NORVASC) 2.5 MG tablet TAKE 1 TABLET(2.5 MG) BY MOUTH DAILY  . aspirin 81 MG tablet Take 81 mg by mouth daily.  Marland Kitchen atorvastatin (LIPITOR) 40 MG tablet TAKE 1 TABLET(40 MG) BY MOUTH DAILY  . blood glucose meter kit and supplies One touch ultra, test once daily, E11.9  . Coenzyme Q10 (COQ-10) 100 MG CAPS Take 1 tablet by mouth daily.  . cyclobenzaprine (FLEXERIL) 10 MG tablet Take 1 tablet (10 mg total) by mouth every 8 (eight) hours as needed for muscle spasms (do not drive while using, may make you drowsy). (Patient not taking: Reported on 05/25/2019)  . empagliflozin (JARDIANCE) 10 MG TABS tablet Take 10 mg by mouth daily before breakfast.  . finasteride (PROSCAR) 5 MG tablet TAKE 1 TABLET(5 MG) BY MOUTH DAILY  . glucose blood (ONE TOUCH ULTRA TEST) test strip TEST TWICE A DAILY AS DIRECTED  . Lancets Misc. MISC ONETOUCH DELICA LANCETS FINE MISC check blood sugar twice daily  . lisinopril (ZESTRIL) 20 MG  tablet TAKE 1 TABLET(20 MG) BY MOUTH DAILY  . metFORMIN (GLUCOPHAGE) 500 MG tablet TAKE 2 TABLETS(1000 MG) BY MOUTH TWICE DAILY WITH A MEAL  . Multiple Vitamins-Minerals (CENTRUM SILVER PO) Take 1 tablet by mouth daily.  . tamsulosin (FLOMAX) 0.4 MG CAPS capsule Take 1 capsule (0.4 mg total) by mouth daily.   No facility-administered encounter medications on file as of 07/06/2019.     Objective:   Goals Addressed              This Visit's Progress     Patient Stated   .  PharmD "I can't afford this medication" (pt-stated)        CARE PLAN ENTRY (see longtitudinal plan of care for additional care plan information)  Current Barriers:  . Diabetes: uncontrolled; complicated by chronic medical conditions including HTN, HLD, BPH most recent A1c 7.1% . Most recent eGFR: ~77 ml/min . Current antihyperglycemic regimen: metformin 1000 mg BID, glimepiride 2 mg BID, Jardiance 10 mg  o APPROVED for Jardiance assistance through [place] through 01/15/20 . Reports 1 episodes of hypoglycemia in the mid-afternoon on Tuesday . Current blood glucose readings:  o Before lunch: 100-120s o After meal: max 140s . Cardiovascular risk reduction: o Current hypertensive regimen: amlodipine 2.5 mg daily, lisinopril 20 mg daily - Home BP readings: 115/63; 109/56, 110/60. Denies any s/sx lightheadedness, dizziness  o Current hyperlipidemia regimen: atorvastatin 40 mg; LDL at goal <70 o Current antiplatelet regimen: ASA 81 mg daily   Pharmacist Clinical Goal(s):  Marland Kitchen Over the next  90 days, patient will work with PharmD and primary care provider to address optimized glucose readings  Interventions: . Comprehensive medication review performed, medication list updated in electronic medical record . Inter-disciplinary care team collaboration (see longitudinal plan of care) . Continue metformin 1000 mg BID and Jardiance 10 mg daily. PCP f/u in ~1 week  Patient Self Care Activities:  . Patient will check blood  glucose BID, document, and provide at future appointments . Patient will take medications as prescribed . Patient will report any questions or concerns to provider   Please see past updates related to this goal by clicking on the "Past Updates" button in the selected goal          Plan:  - Will f/u as previously scheduled  Catie Darnelle Maffucci, PharmD, [place], [place] Pharmacist [place] [phone]

## 2019-07-09 ENCOUNTER — Telehealth: Payer: Medicare Other

## 2019-07-13 ENCOUNTER — Other Ambulatory Visit: Payer: Self-pay | Admitting: Family Medicine

## 2019-07-14 ENCOUNTER — Other Ambulatory Visit: Payer: Self-pay

## 2019-07-14 ENCOUNTER — Encounter: Payer: Self-pay | Admitting: Family Medicine

## 2019-07-14 ENCOUNTER — Ambulatory Visit (INDEPENDENT_AMBULATORY_CARE_PROVIDER_SITE_OTHER): Payer: Medicare Other | Admitting: Family Medicine

## 2019-07-14 VITALS — BP 135/80 | HR 87 | Temp 98.1°F | Ht 70.0 in | Wt 215.1 lb

## 2019-07-14 DIAGNOSIS — E1159 Type 2 diabetes mellitus with other circulatory complications: Secondary | ICD-10-CM | POA: Diagnosis not present

## 2019-07-14 DIAGNOSIS — I10 Essential (primary) hypertension: Secondary | ICD-10-CM

## 2019-07-14 LAB — POCT GLYCOSYLATED HEMOGLOBIN (HGB A1C): Hemoglobin A1C: 6.3 % — AB (ref 4.0–5.6)

## 2019-07-14 MED ORDER — LANCETS MISC. MISC: 5 refills | Status: DC

## 2019-07-14 MED ORDER — EMPAGLIFLOZIN 25 MG PO TABS: 25.0000 mg | ORAL_TABLET | Freq: Every day | ORAL | 3 refills | Status: DC

## 2019-07-14 MED ORDER — GLUCOSE BLOOD VI STRP: ORAL_STRIP | 5 refills | Status: DC

## 2019-07-14 MED ORDER — EMPAGLIFLOZIN 10 MG PO TABS
10.0000 mg | ORAL_TABLET | Freq: Every day | ORAL | 3 refills | Status: DC
Start: 2019-07-14 — End: 2019-08-27

## 2019-07-14 NOTE — Assessment & Plan Note (Signed)
Currently very well controlled at home.  Blood pressure readings reviewed and outlined above.  He will continue his current regimen.  He will let us know if it trends up.

## 2019-07-14 NOTE — Assessment & Plan Note (Addendum)
Some CBGs running slightly above goal though his A1c is well controlled.  We will continue Jardiance 10 mg once daily and Metformin.  CMA will contact the pharmacy to cancel the Jardiance 25 mg prescription.

## 2019-07-14 NOTE — Telephone Encounter (Signed)
I called the pharmacy and cancelled the 26 mg of Jardiance with the pharmacist.  Lesia Hausen

## 2019-07-14 NOTE — Telephone Encounter (Signed)
-----   Message from [name], MD sent at 07/14/2019  8:33 AM EDT ----- Please call his pharmacy and cancel the jardiance 25 mg prescription. I want him to stay on jardiance 10 mg daily.

## 2019-07-14 NOTE — Progress Notes (Signed)
  Dakota Rumps, MD Phone: [phone]  Dakota Gonzalez is a 74 y.o. male who presents today for f/u.  Hypertension: Has been running less than 120/70 since going on Jardiance.  Taking amlodipine and lisinopril.  No chest pain, shortness of breath, or edema.  Diabetes: Typically 108-150 on CBGs.  He is off glimepiride.  He is taking Jardiance and Metformin.  No polyuria or polydipsia.  No hypoglycemia.  Social History   Tobacco Use  Smoking Status Former Smoker  Smokeless Tobacco Never Used     ROS see history of present illness  Objective  Physical Exam Vitals:   07/14/19 0810  BP: 135/80  Pulse: 87  Temp: 98.1 F (36.7 C)  SpO2: 99%    BP Readings from Last 3 Encounters:  07/14/19 135/80  04/13/19 (!) 160/80  04/09/18 140/90   Wt Readings from Last 3 Encounters:  07/14/19 215 lb 1.6 oz (97.6 kg)  04/30/19 229 lb (103.9 kg)  04/13/19 229 lb 12.8 oz (104.2 kg)    Physical Exam Constitutional:      General: He is not in acute distress.    Appearance: He is not diaphoretic.  Cardiovascular:     Rate and Rhythm: Normal rate and regular rhythm.     Heart sounds: Normal heart sounds.  Pulmonary:     Effort: Pulmonary effort is normal.     Breath sounds: Normal breath sounds.  Musculoskeletal:     Right lower leg: No edema.     Left lower leg: No edema.  Skin:    General: Skin is warm and dry.  Neurological:     Mental Status: He is alert.      Assessment/Plan: Please see individual problem list.  HTN (hypertension) Currently very well controlled at home.  Blood pressure readings reviewed and outlined above.  He will continue his current regimen.  He will let us know if it trends up.  Diabetes mellitus type 2, controlled ([place]) Some CBGs running slightly above goal though his A1c is well controlled.  We will continue Jardiance 10 mg once daily and Metformin.  CMA will contact the pharmacy to cancel the Jardiance 25 mg prescription.   Orders Placed  This Encounter  Procedures  . POCT HgB A1C    Meds ordered this encounter  Medications  . DISCONTD: empagliflozin (JARDIANCE) 25 MG TABS tablet    Sig: Take 1 tablet (25 mg total) by mouth daily before breakfast.    Dispense:  30 tablet    Refill:  3    Needs to be generic  . empagliflozin (JARDIANCE) 10 MG TABS tablet    Sig: Take 1 tablet (10 mg total) by mouth daily before breakfast.    Dispense:  90 tablet    Refill:  3    This visit occurred during the SARS-CoV-2 public health emergency.  Safety protocols were in place, including screening questions prior to the visit, additional usage of staff PPE, and extensive cleaning of exam room while observing appropriate contact time as indicated for disinfecting solutions.    Dakota Rumps, MD [place]

## 2019-07-14 NOTE — Patient Instructions (Addendum)
Nice to see you. Please continue current medications. Please continue to monitor your sugars and blood pressure.

## 2019-07-15 ENCOUNTER — Ambulatory Visit: Payer: Self-pay | Admitting: Pharmacist

## 2019-07-15 ENCOUNTER — Telehealth: Payer: Self-pay | Admitting: Family Medicine

## 2019-07-15 NOTE — Telephone Encounter (Signed)
Pt would like a call back about his empagliflozin (JARDIANCE) 10 MG TABS tablet being sent to [facility] he said that he gets this from the company and wanted to know if this is still correct

## 2019-07-15 NOTE — Chronic Care Management (AMB) (Signed)
°  Chronic Care Management   Note  07/15/2019 Name: [name] MRN: [id number] DOB: Jun 06, 1945  Dakota Gonzalez is a 74 y.o. year old male who is a primary care patient of Caryl Bis, Angela Adam, MD. The CCM team was consulted for assistance with chronic disease management and care coordination needs.    Received call from patient with confusion about why the Jardiance script was sent to [facility]. Reviewed that he doesn't need to worry about picking it up, and to continue to take through [facility] patient assistance. He verbalized understanding.   Catie Darnelle Maffucci, PharmD, [place], CPP Clinical Pharmacist [place] [phone]

## 2019-07-16 NOTE — Telephone Encounter (Signed)
Catie the pharmacist fixed this and notified the patient.  Wahneta Derocher,cma

## 2019-07-22 ENCOUNTER — Other Ambulatory Visit: Payer: Self-pay | Admitting: Family Medicine

## 2019-07-27 ENCOUNTER — Other Ambulatory Visit: Payer: Self-pay | Admitting: Family Medicine

## 2019-08-02 ENCOUNTER — Other Ambulatory Visit: Payer: Self-pay | Admitting: Family Medicine

## 2019-08-10 ENCOUNTER — Encounter: Payer: Self-pay | Admitting: Family Medicine

## 2019-08-27 ENCOUNTER — Ambulatory Visit (INDEPENDENT_AMBULATORY_CARE_PROVIDER_SITE_OTHER): Payer: Medicare Other | Admitting: Pharmacist

## 2019-08-27 ENCOUNTER — Encounter: Payer: Self-pay | Admitting: Pharmacist

## 2019-08-27 ENCOUNTER — Telehealth: Payer: Self-pay | Admitting: Pharmacist

## 2019-08-27 DIAGNOSIS — E1159 Type 2 diabetes mellitus with other circulatory complications: Secondary | ICD-10-CM | POA: Diagnosis not present

## 2019-08-27 DIAGNOSIS — E1165 Type 2 diabetes mellitus with hyperglycemia: Secondary | ICD-10-CM | POA: Diagnosis not present

## 2019-08-27 MED ORDER — EMPAGLIFLOZIN 25 MG PO TABS: 25.0000 mg | ORAL_TABLET | Freq: Every day | ORAL | 3 refills | Status: DC

## 2019-08-27 NOTE — Telephone Encounter (Signed)
Patient returned call, see CCM documentation

## 2019-08-27 NOTE — Chronic Care Management (AMB) (Signed)
Chronic Care Management   Follow Up Note   08/27/2019 Name: DARIO YONO MRN: [id number] DOB: 07/18/1945  Referred by: [name], MD Reason for referral : Chronic Care Management (Medication Management)   AHYAN KREEGER is a 74 y.o. year old male who is a primary care patient of Caryl Bis, Angela Adam, MD. The CCM team was consulted for assistance with chronic disease management and care coordination needs.    Contacted patient for medication management review.  Review of patient status, including review of consultants reports, relevant laboratory and other test results, and collaboration with appropriate care team members and the patient's provider was performed as part of comprehensive patient evaluation and provision of chronic care management services.    SDOH (Social Determinants of Health) assessments performed: Yes See Care Plan activities for detailed interventions related to SDOH)  SDOH Interventions     Most Recent Value  SDOH Interventions  Financial Strain Interventions Other (Comment)  [medication assistance]       Outpatient Encounter Medications as of 08/27/2019  Medication Sig  . metFORMIN (GLUCOPHAGE) 500 MG tablet TAKE 2 TABLETS(1000 MG) BY MOUTH TWICE DAILY WITH A MEAL  . [DISCONTINUED] empagliflozin (JARDIANCE) 10 MG TABS tablet Take 1 tablet (10 mg total) by mouth daily before breakfast.  . amLODipine (NORVASC) 2.5 MG tablet TAKE 1 TABLET(2.5 MG) BY MOUTH DAILY  . aspirin 81 MG tablet Take 81 mg by mouth daily.  Marland Kitchen atorvastatin (LIPITOR) 40 MG tablet TAKE 1 TABLET(40 MG) BY MOUTH DAILY  . blood glucose meter kit and supplies One touch ultra, test once daily, E11.9  . Coenzyme Q10 (COQ-10) 100 MG CAPS Take 1 tablet by mouth daily.  . cyclobenzaprine (FLEXERIL) 10 MG tablet Take 1 tablet (10 mg total) by mouth every 8 (eight) hours as needed for muscle spasms (do not drive while using, may make you drowsy).  . empagliflozin (JARDIANCE) 25 MG TABS tablet  Take 1 tablet (25 mg total) by mouth daily.  . finasteride (PROSCAR) 5 MG tablet TAKE 1 TABLET(5 MG) BY MOUTH DAILY  . glucose blood (ONE TOUCH ULTRA TEST) test strip TEST TWICE A DAILY AS DIRECTED  . Lancets Misc. MISC ONETOUCH DELICA LANCETS FINE MISC check blood sugar twice daily  . lisinopril (ZESTRIL) 20 MG tablet TAKE 1 TABLET(20 MG) BY MOUTH DAILY  . Multiple Vitamins-Minerals (CENTRUM SILVER PO) Take 1 tablet by mouth daily.  . tamsulosin (FLOMAX) 0.4 MG CAPS capsule TAKE 1 CAPSULE(0.4 MG) BY MOUTH DAILY   No facility-administered encounter medications on file as of 08/27/2019.     Objective:   Goals Addressed              This Visit's Progress     Patient Stated   .  PharmD "I can't afford this medication" (pt-stated)        CARE PLAN ENTRY (see longtitudinal plan of care for additional care plan information)  Current Barriers:  . Social, financial, and community barriers:  o Denies any concerns at this time given medication assistance approval . Diabetes: controlled, though home glucose readings a bit higher; complicated by chronic medical conditions including HTN, HLD, BPH most recent A1c 6.3% . Most recent eGFR: ~77 ml/min . Current antihyperglycemic regimen: metformin 1000 mg BID, Jardiance 10 mg  o APPROVED for Jardiance assistance through [place] through 01/15/20 . Denies any episodes of hypoglycemia . Current blood glucose readings:  o Before supper: ~120-130s o 3 hour post prandial: ~150-160s, occasional 200s  . Current meal  patterns: o Breakfast: sometimes 1 piece of toast, sometimes Glucerna shake o Focuses on whole wheat choices, cutting back on pastas and pizzas . Cardiovascular risk reduction: o Current hypertensive regimen: amlodipine 2.5 mg daily, lisinopril 20 mg daily; home BP readings remain ~110-120s/60-70s, HR in 80s.  o Current hyperlipidemia regimen: atorvastatin 40 mg; LDL at goal <70 o Current antiplatelet regimen: ASA 81 mg daily . Eye exam: up  to date . Nephropathy screening: up to date  Pharmacist Clinical Goal(s):  Marland Kitchen Over the next 90 days, patient will work with PharmD and primary care provider to address optimized glucose readings  Interventions: . Comprehensive medication review performed, medication list updated in electronic medical record . Inter-disciplinary care team collaboration (see longitudinal plan of care) . Given elevated "fasting" readings, increase Jardiance to 25 mg daily. Contacted [facility] to update prescription.  . Discussed goal BP readings. Patient will continue to check BP at home and let me know if any symptoms of hypotension w/ Jardiance dose increase. If so, will plan to d/c amlodipine.   Patient Self Care Activities:  . Patient will check blood glucose BID, document, and provide at future appointments . Patient will take medications as prescribed . Patient will report any questions or concerns to provider   Please see past updates related to this goal by clicking on the "Past Updates" button in the selected goal          Plan:  -Scheduled f/u call in ~ 6 weeks  Catie Darnelle Maffucci, PharmD, [place], [place] Pharmacist [place] [phone]

## 2019-08-27 NOTE — Telephone Encounter (Signed)
°  Chronic Care Management   Note  08/27/2019 Name: JACKSON COFFIELD MRN: [id number] DOB: 07-May-1945   Attempted to contact patient for scheduled appointment for medication management support. Left HIPAA compliant message for patient to return my call at their convenience.    Plan: - If I do not hear back from the patient by end of business today, will collaborate with Care Guide to outreach to schedule follow up with me  Catie Darnelle Maffucci, PharmD, [place], [place] Pharmacist [place] [phone]

## 2019-08-27 NOTE — Patient Instructions (Signed)
Visit Information  Goals Addressed              This Visit's Progress     Patient Stated   .  PharmD "I can't afford this medication" (pt-stated)        CARE PLAN ENTRY (see longtitudinal plan of care for additional care plan information)  Current Barriers:  . Social, financial, and community barriers:  o Denies any concerns at this time given medication assistance approval . Diabetes: controlled, though home glucose readings a bit higher; complicated by chronic medical conditions including HTN, HLD, BPH most recent A1c 6.3% . Most recent eGFR: ~77 ml/min . Current antihyperglycemic regimen: metformin 1000 mg BID, Jardiance 10 mg  o APPROVED for Jardiance assistance through [place] through 01/15/20 . Denies any episodes of hypoglycemia . Current blood glucose readings:  o Before supper: ~120-130s o 3 hour post prandial: ~150-160s, occasional 200s  . Current meal patterns: o Breakfast: sometimes 1 piece of toast, sometimes Glucerna shake o Focuses on whole wheat choices, cutting back on pastas and pizzas . Cardiovascular risk reduction: o Current hypertensive regimen: amlodipine 2.5 mg daily, lisinopril 20 mg daily; home BP readings remain ~110-120s/60-70s, HR in 80s.  o Current hyperlipidemia regimen: atorvastatin 40 mg; LDL at goal <70 o Current antiplatelet regimen: ASA 81 mg daily . Eye exam: up to date . Nephropathy screening: up to date  Pharmacist Clinical Goal(s):  Marland Kitchen Over the next 90 days, patient will work with PharmD and primary care provider to address optimized glucose readings  Interventions: . Comprehensive medication review performed, medication list updated in electronic medical record . Inter-disciplinary care team collaboration (see longitudinal plan of care) . Given elevated "fasting" readings, increase Jardiance to 25 mg daily. Contacted [facility] to update prescription.  . Discussed goal BP readings. Patient will continue to check BP at home and let me know if  any symptoms of hypotension w/ Jardiance dose increase. If so, will plan to d/c amlodipine.   Patient Self Care Activities:  . Patient will check blood glucose BID, document, and provide at future appointments . Patient will take medications as prescribed . Patient will report any questions or concerns to provider   Please see past updates related to this goal by clicking on the "Past Updates" button in the selected goal         The patient verbalized understanding of instructions provided today and declined a print copy of patient instruction materials.   Plan:  -Scheduled f/u call in ~ 6 weeks  Catie Darnelle Maffucci, PharmD, [place], [place] Pharmacist [place] [phone]

## 2019-09-04 ENCOUNTER — Ambulatory Visit: Payer: Medicare Other | Admitting: Pharmacist

## 2019-09-04 DIAGNOSIS — E1159 Type 2 diabetes mellitus with other circulatory complications: Secondary | ICD-10-CM

## 2019-09-04 NOTE — Chronic Care Management (AMB) (Signed)
Chronic Care Management   Follow Up Note   09/04/2019 Name: Dakota Gonzalez MRN: [id number] DOB: 1945-02-28  Referred by: [name], MD Reason for referral : Chronic Care Management (Medication Management)   Dakota Gonzalez is a 74 y.o. year old male who is a primary care patient of Caryl Bis, Angela Adam, MD. The CCM team was consulted for assistance with chronic disease management and care coordination needs.    Received message from patient regarding medication assistance.  Review of patient status, including review of consultants reports, relevant laboratory and other test results, and collaboration with appropriate care team members and the patient's provider was performed as part of comprehensive patient evaluation and provision of chronic care management services.    SDOH (Social Determinants of Health) assessments performed: Yes See Care Plan activities for detailed interventions related to SDOH)  SDOH Interventions     Most Recent Value  SDOH Interventions  Financial Strain Interventions Other (Comment)  [medication assistance]       Outpatient Encounter Medications as of 09/04/2019  Medication Sig  . amLODipine (NORVASC) 2.5 MG tablet TAKE 1 TABLET(2.5 MG) BY MOUTH DAILY  . aspirin 81 MG tablet Take 81 mg by mouth daily.  Marland Kitchen atorvastatin (LIPITOR) 40 MG tablet TAKE 1 TABLET(40 MG) BY MOUTH DAILY  . blood glucose meter kit and supplies One touch ultra, test once daily, E11.9  . Coenzyme Q10 (COQ-10) 100 MG CAPS Take 1 tablet by mouth daily.  . cyclobenzaprine (FLEXERIL) 10 MG tablet Take 1 tablet (10 mg total) by mouth every 8 (eight) hours as needed for muscle spasms (do not drive while using, may make you drowsy).  . empagliflozin (JARDIANCE) 25 MG TABS tablet Take 1 tablet (25 mg total) by mouth daily.  . finasteride (PROSCAR) 5 MG tablet TAKE 1 TABLET(5 MG) BY MOUTH DAILY  . glucose blood (ONE TOUCH ULTRA TEST) test strip TEST TWICE A DAILY AS DIRECTED  .  Lancets Misc. MISC ONETOUCH DELICA LANCETS FINE MISC check blood sugar twice daily  . lisinopril (ZESTRIL) 20 MG tablet TAKE 1 TABLET(20 MG) BY MOUTH DAILY  . metFORMIN (GLUCOPHAGE) 500 MG tablet TAKE 2 TABLETS(1000 MG) BY MOUTH TWICE DAILY WITH A MEAL  . Multiple Vitamins-Minerals (CENTRUM SILVER PO) Take 1 tablet by mouth daily.  . tamsulosin (FLOMAX) 0.4 MG CAPS capsule TAKE 1 CAPSULE(0.4 MG) BY MOUTH DAILY   No facility-administered encounter medications on file as of 09/04/2019.     Objective:   Goals Addressed              This Visit's Progress     Patient Stated   .  PharmD "I can't afford this medication" (pt-stated)        CARE PLAN ENTRY (see longtitudinal plan of care for additional care plan information)  Current Barriers:  . Social, financial, and community barriers:  o Notes today that he received the 10 mg and 25 mg orders of Jardiance from [facility] patient assistance.  . Diabetes: controlled, though home glucose readings a bit higher; complicated by chronic medical conditions including HTN, HLD, BPH most recent A1c 6.3% . Most recent eGFR: ~77 ml/min . Current antihyperglycemic regimen: metformin 1000 mg BID, Jardiance 25 mg - just received supply o APPROVED for Jardiance assistance through [place] through 01/15/20 . Cardiovascular risk reduction: o Current hypertensive regimen: amlodipine 2.5 mg daily, lisinopril 20 mg daily;  o Current hyperlipidemia regimen: atorvastatin 40 mg; LDL at goal <70 o Current antiplatelet regimen: ASA 81 mg  daily . Eye exam: up to date . Nephropathy screening: up to date  Pharmacist Clinical Goal(s):  Marland Kitchen Over the next 90 days, patient will work with PharmD and primary care provider to address optimized glucose readings  Interventions: . Comprehensive medication review performed, medication list updated in electronic medical record . Inter-disciplinary care team collaboration (see longitudinal plan of care) . Counseled patient that he can  take two of the 10 mg Jardiance tablets to complete this supply first, then increase to the 25 mg tablet strength.    Patient Self Care Activities:  . Patient will check blood glucose BID, document, and provide at future appointments . Patient will take medications as prescribed . Patient will report any questions or concerns to provider   Please see past updates related to this goal by clicking on the "Past Updates" button in the selected goal          Plan:  - Will outreach as previously scheduled  Catie Darnelle Maffucci, PharmD, [place], [place] Pharmacist [place] [phone]

## 2019-09-04 NOTE — Patient Instructions (Signed)
Visit Information  Goals Addressed              This Visit's Progress     Patient Stated   .  PharmD "I can't afford this medication" (pt-stated)        CARE PLAN ENTRY (see longtitudinal plan of care for additional care plan information)  Current Barriers:  . Social, financial, and community barriers:  o Notes today that he received the 10 mg and 25 mg orders of Jardiance from [facility] patient assistance.  . Diabetes: controlled, though home glucose readings a bit higher; complicated by chronic medical conditions including HTN, HLD, BPH most recent A1c 6.3% . Most recent eGFR: ~77 ml/min . Current antihyperglycemic regimen: metformin 1000 mg BID, Jardiance 25 mg - just received supply o APPROVED for Jardiance assistance through [place] through 01/15/20 . Cardiovascular risk reduction: o Current hypertensive regimen: amlodipine 2.5 mg daily, lisinopril 20 mg daily;  o Current hyperlipidemia regimen: atorvastatin 40 mg; LDL at goal <70 o Current antiplatelet regimen: ASA 81 mg daily . Eye exam: up to date . Nephropathy screening: up to date  Pharmacist Clinical Goal(s):  Marland Kitchen Over the next 90 days, patient will work with PharmD and primary care provider to address optimized glucose readings  Interventions: . Comprehensive medication review performed, medication list updated in electronic medical record . Inter-disciplinary care team collaboration (see longitudinal plan of care) . Counseled patient that he can take two of the 10 mg Jardiance tablets to complete this supply first, then increase to the 25 mg tablet strength.    Patient Self Care Activities:  . Patient will check blood glucose BID, document, and provide at future appointments . Patient will take medications as prescribed . Patient will report any questions or concerns to provider   Please see past updates related to this goal by clicking on the "Past Updates" button in the selected goal         The patient verbalized  understanding of instructions provided today and declined a print copy of patient instruction materials.   Plan:  - Will outreach as previously scheduled  Catie Darnelle Maffucci, PharmD, [place], [place] Pharmacist [place] [phone]

## 2019-09-30 ENCOUNTER — Telehealth: Payer: Self-pay

## 2019-09-30 NOTE — Chronic Care Management (AMB) (Signed)
  Care Management   Note  09/30/2019 Name: Dakota Gonzalez MRN: [id number] DOB: 10-14-1945  Dakota Gonzalez is a 74 y.o. year old male who is a primary care patient of [name], MD and is actively engaged with the care management team. I reached out to Laurell Roof by phone today to assist with re-scheduling a follow up visit with the Pharmacist  Follow up plan: Unsuccessful telephone outreach attempt made. A HIPPA compliant phone message was left for the patient providing contact information and requesting a return call.  The care management team will reach out to the patient again over the next 7 days.  If patient returns call to provider office, please advise to call [facility]  at [address], [place], [place], [place] [address] Direct Dial: [phone] [email or web address] Website: [email or web address]

## 2019-09-30 NOTE — Chronic Care Management (AMB) (Signed)
  Care Management   Note  09/30/2019 Name: Dakota Gonzalez MRN: [id number] DOB: 01-Apr-1945  Dakota Gonzalez is a 74 y.o. year old male who is a primary care patient of Caryl Bis, Angela Adam, MD and is actively engaged with the care management team. I reached out to Laurell Roof by phone today to assist with re-scheduling a follow up visit with the Pharmacist  Follow up plan: Telephone appointment with care management team member scheduled for:10/08/2019  Noreene Larsson, [place], [place], [place] [address] Direct Dial: [phone] [email or web address] Website: [email or web address]

## 2019-10-08 ENCOUNTER — Ambulatory Visit (INDEPENDENT_AMBULATORY_CARE_PROVIDER_SITE_OTHER): Payer: Medicare Other | Admitting: Pharmacist

## 2019-10-08 DIAGNOSIS — I10 Essential (primary) hypertension: Secondary | ICD-10-CM

## 2019-10-08 DIAGNOSIS — E1159 Type 2 diabetes mellitus with other circulatory complications: Secondary | ICD-10-CM

## 2019-10-08 DIAGNOSIS — N400 Enlarged prostate without lower urinary tract symptoms: Secondary | ICD-10-CM

## 2019-10-08 NOTE — Chronic Care Management (AMB) (Signed)
Chronic Care Management   Follow Up Note   10/08/2019 Name: Dakota Gonzalez MRN: [id number] DOB: 1945/11/21  Referred by: Dakota Haven, MD Reason for referral : Chronic Care Management (Medication Management)   Dakota Gonzalez is a 74 y.o. year old male who is a primary care patient of Caryl Bis, Angela Adam, MD. The CCM team was consulted for assistance with chronic disease management and care coordination needs.    Contacted patient for medication management review.  Review of patient status, including review of consultants reports, relevant laboratory and other test results, and collaboration with appropriate care team members and the patient's provider was performed as part of comprehensive patient evaluation and provision of chronic care management services.    SDOH (Social Determinants of Health) assessments performed: Yes See Care Plan activities for detailed interventions related to SDOH)  SDOH Interventions     Most Recent Value  SDOH Interventions  Financial Strain Interventions Other (Comment)  [manufacturer assistance]       Outpatient Encounter Medications as of 10/08/2019  Medication Sig  . amLODipine (NORVASC) 2.5 MG tablet TAKE 1 TABLET(2.5 MG) BY MOUTH DAILY  . aspirin 81 MG tablet Take 81 mg by mouth daily.  Marland Kitchen atorvastatin (LIPITOR) 40 MG tablet TAKE 1 TABLET(40 MG) BY MOUTH DAILY  . blood glucose meter kit and supplies One touch ultra, test once daily, E11.9  . Coenzyme Q10 (COQ-10) 100 MG CAPS Take 1 tablet by mouth daily.  . cyclobenzaprine (FLEXERIL) 10 MG tablet Take 1 tablet (10 mg total) by mouth every 8 (eight) hours as needed for muscle spasms (do not drive while using, may make you drowsy).  . empagliflozin (JARDIANCE) 25 MG TABS tablet Take 1 tablet (25 mg total) by mouth daily.  . finasteride (PROSCAR) 5 MG tablet TAKE 1 TABLET(5 MG) BY MOUTH DAILY  . glucose blood (ONE TOUCH ULTRA TEST) test strip TEST TWICE A DAILY AS DIRECTED  . Lancets Misc.  MISC ONETOUCH DELICA LANCETS FINE MISC check blood sugar twice daily  . lisinopril (ZESTRIL) 20 MG tablet TAKE 1 TABLET(20 MG) BY MOUTH DAILY  . metFORMIN (GLUCOPHAGE) 500 MG tablet TAKE 2 TABLETS(1000 MG) BY MOUTH TWICE DAILY WITH A MEAL  . Multiple Vitamins-Minerals (CENTRUM SILVER PO) Take 1 tablet by mouth daily.  . tamsulosin (FLOMAX) 0.4 MG CAPS capsule TAKE 1 CAPSULE(0.4 MG) BY MOUTH DAILY   No facility-administered encounter medications on file as of 10/08/2019.     Objective:   Goals Addressed              This Visit's Progress     Patient Stated   .  PharmD "I want to keep my health controlled" (pt-stated)        [place] (see longtitudinal plan of care for additional care plan information)  Current Barriers:  . Social, financial, and community barriers:  o None at this time. APPROVED for Jardiance assistance for 01/15/20 . Diabetes: controlled, complicated by chronic medical conditions including HTN, HLD, BPH most recent A1c 6.3% . Most recent eGFR: ~77 ml/min . Current antihyperglycemic regimen: metformin 1000 mg BID, Jardiance 20 mg daily (completing 10 mg supply before starting 25 mg supply) o APPROVED for Jardiance assistance through [place] through 01/15/20 . Current glucose readings: o Fastings 100s-130s, occasional low 140, though also occasional 80-90. . Current exercise:  o Going to the gym 2 times weekly, notes that his glucose readings are improved after the days that he exercises. Plans to increase to 3  times weekly. . Cardiovascular risk reduction: o Current hypertensive regimen: amlodipine 2.5 mg daily, lisinopril 20 mg daily;  - Home BP readings: 100-110s/50-60s. Denies any lightheadedness/dizziness or s/sx hypotension. Wonders about d/c amlodipine.  o Current hyperlipidemia regimen: atorvastatin 40 mg; LDL at goal <70 o Current antiplatelet regimen: ASA 81 mg daily . BPH: finasteride 5 mg daily, tamsulosin 0.4 mg daily - notes that his last script  for finasteride was prescribed by urology, so his refill request went there. Is no longer following yearly w/ urology d/t stability. Wonders if PCP would order a refill on finasteride for him . Eye exam: up to date . Nephropathy screening: up to date . Foot exam: up to date . Vaccines: due for annual flu shot  Pharmacist Clinical Goal(s):  Marland Kitchen Over the next 90 days, patient will work with PharmD and primary care provider to address optimized glucose readings  Interventions: . Comprehensive medication review performed, medication list updated in electronic medical record . Inter-disciplinary care team collaboration (see longitudinal plan of care) . Praised for continued maintenance of goal glucose readings. Continue current regimen. Will transition to Jardiance 25 mg tab when he completes supply of 10 mg tab. Discussed that we will pursue re-enrollment for [facility] Cares assistance for Jardiance at our next call . Discussed current BP readings, especially in light of patient goal to increase exercise. Agree to hold amlodipine, have patient continue to check BP readings at home. Agree that if SBP remains <130, we will plan to officially d/c amlodipine moving forward. If BP readings trend up, patient will re-start amlodipine. Will f/u about this in ~5 weeks . Will pass along patient's request to refill finasteride to PCP  Patient Self Care Activities:  . Patient will check blood glucose BID, document, and provide at future appointments . Patient will take medications as prescribed . Patient will report any questions or concerns to provider   Please see past updates related to this goal by clicking on the "Past Updates" button in the selected goal          Plan:  - Scheduled f/u call in ~ 5-6 weeks  Catie Darnelle Maffucci, PharmD, [place], [place] Pharmacist [place] [phone]

## 2019-10-08 NOTE — Patient Instructions (Addendum)
Visit Information  Goals Addressed              This Visit's Progress     Patient Stated   .  PharmD "I want to keep my health controlled" (pt-stated)        [place] (see longtitudinal plan of care for additional care plan information)  Current Barriers:  . Social, financial, and community barriers:  o None at this time. APPROVED for Jardiance assistance for 01/15/20 . Diabetes: controlled, complicated by chronic medical conditions including HTN, HLD, BPH most recent A1c 6.3% . Most recent eGFR: ~77 ml/min . Current antihyperglycemic regimen: metformin 1000 mg BID, Jardiance 20 mg daily (completing 10 mg supply before starting 25 mg supply) o APPROVED for Jardiance assistance through [place] through 01/15/20 . Current glucose readings: o Fastings 100s-130s, occasional low 140, though also occasional 80-90. . Current exercise:  o Going to the gym 2 times weekly, notes that his glucose readings are improved after the days that he exercises. Plans to increase to 3 times weekly. . Cardiovascular risk reduction: o Current hypertensive regimen: amlodipine 2.5 mg daily, lisinopril 20 mg daily;  - Home BP readings: 100-110s/50-60s. Denies any lightheadedness/dizziness or s/sx hypotension. Wonders about d/c amlodipine.  o Current hyperlipidemia regimen: atorvastatin 40 mg; LDL at goal <70 o Current antiplatelet regimen: ASA 81 mg daily . BPH: finasteride 5 mg daily, tamsulosin 0.4 mg daily - notes that his last script for finasteride was prescribed by urology, so his refill request went there. Is no longer following yearly w/ urology d/t stability. Wonders if PCP would order a refill on finasteride for him . Eye exam: up to date . Nephropathy screening: up to date . Foot exam: up to date . Vaccines: due for annual flu shot  Pharmacist Clinical Goal(s):  Marland Kitchen Over the next 90 days, patient will work with PharmD and primary care provider to address optimized glucose  readings  Interventions: . Comprehensive medication review performed, medication list updated in electronic medical record . Inter-disciplinary care team collaboration (see longitudinal plan of care) . Praised for continued maintenance of goal glucose readings. Continue current regimen. Will transition to Jardiance 25 mg tab when he completes supply of 10 mg tab. Discussed that we will pursue re-enrollment for [facility] Cares assistance for Jardiance at our next call . Discussed current BP readings, especially in light of patient goal to increase exercise. Agree to hold amlodipine, have patient continue to check BP readings at home. Agree that if SBP remains <130, we will plan to officially d/c amlodipine moving forward. If BP readings trend up, patient will re-start amlodipine. Will f/u about this in ~5 weeks . Will pass along patient's request to refill finasteride to PCP  Patient Self Care Activities:  . Patient will check blood glucose BID, document, and provide at future appointments . Patient will take medications as prescribed . Patient will report any questions or concerns to provider   Please see past updates related to this goal by clicking on the "Past Updates" button in the selected goal         The patient verbalized understanding of instructions provided today and declined a print copy of patient instruction materials.    Plan:  - Scheduled f/u call in ~ 5-6 weeks  Catie Darnelle Maffucci, PharmD, [place], [place] Pharmacist [place] [phone]

## 2019-10-10 ENCOUNTER — Encounter: Payer: Self-pay | Admitting: Family Medicine

## 2019-10-10 MED ORDER — FINASTERIDE 5 MG PO TABS: ORAL_TABLET | ORAL | 1 refills | Status: DC

## 2019-10-10 NOTE — Addendum Note (Signed)
Addended by: [name] on: 10/10/2019 02:25 PM   Modules accepted: Orders

## 2019-10-11 ENCOUNTER — Encounter: Payer: Self-pay | Admitting: Family Medicine

## 2019-10-13 ENCOUNTER — Telehealth: Payer: Medicare Other

## 2019-10-28 ENCOUNTER — Other Ambulatory Visit: Payer: Self-pay | Admitting: Family Medicine

## 2019-11-12 DIAGNOSIS — E119 Type 2 diabetes mellitus without complications: Secondary | ICD-10-CM | POA: Diagnosis not present

## 2019-11-12 LAB — HM DIABETES EYE EXAM

## 2019-11-19 ENCOUNTER — Ambulatory Visit: Payer: Medicare Other | Admitting: Pharmacist

## 2019-11-19 DIAGNOSIS — E1159 Type 2 diabetes mellitus with other circulatory complications: Secondary | ICD-10-CM

## 2019-11-19 DIAGNOSIS — I10 Essential (primary) hypertension: Secondary | ICD-10-CM

## 2019-11-19 DIAGNOSIS — E785 Hyperlipidemia, unspecified: Secondary | ICD-10-CM

## 2019-11-19 DIAGNOSIS — I2510 Atherosclerotic heart disease of native coronary artery without angina pectoris: Secondary | ICD-10-CM

## 2019-11-19 NOTE — Patient Instructions (Signed)
Visit Information  Goals Addressed              This Visit's Progress     Patient Stated   .  PharmD "I want to keep my health controlled" (pt-stated)        Dakota Gonzalez (see longtitudinal plan of care for additional care plan information)  Current Barriers:  . Social, financial, and community barriers:  o None at this time. Due to reapply for Jardiance assistance through [facility] for 2022 . Diabetes: controlled, complicated by chronic medical conditions including HTN, HLD, BPH most recent A1c 6.3% . Most recent eGFR: ~77 ml/min . Current antihyperglycemic regimen: metformin 1000 mg BID, Jardiance 25 mg daily  o APPROVED for Jardiance assistance through [facility] through 01/15/20 . Current glucose readings: o Fastings and pre-supper: 100s-120s o 2 hour post prandial: ~150-160 . Current exercise:  o Gym three days per week.  . Cardiovascular risk reduction: o Current hypertensive regimen: amlodipine 2.5 mg daily- has been holding, lisinopril 20 mg daily;  - Home BP readings: 100-110/60; 2 readings w/ BP up to 120; no episodes of SBP <100 as before when taking amlodipine o Current hyperlipidemia regimen: atorvastatin 40 mg; LDL at goal <70 o Current antiplatelet regimen: ASA 81 mg daily . BPH: finasteride 5 mg daily, tamsulosin 0.4 mg daily; endorses good tolerability and efficacy of this regimen. . Eye exam: DUE . Nephropathy screening: up to date . Foot exam: up to date . Vaccines: due for annual flu shot; up to date on COVID vaccines  Pharmacist Clinical Goal(s):  Marland Kitchen Over the next 90 days, patient will work with PharmD and primary care provider to address optimized glucose readings  Interventions: . Comprehensive medication review performed, medication list updated in electronic medical record . Inter-disciplinary care team collaboration (see longitudinal plan of care) . Reviewed goal A1c, goal fasting, and goal 2 hour post prandial. Praised for continued maintenance of goal  glucose readings. Continue metformin 1000 mg BID and Jardiance 25 mg daily.  . Due to reapply for [facility] assistance. Patient meets new income requirement. Will collaborate w/ CPhT, provider to mail patient assistance application, complete, and submit to BI.  Marland Kitchen Discontinue amlodipine. Continue lisinopril 20 mg daily. Refills up to date. . Lipids at goal. Continue atorvastatin 40 mg daily. Refills up to date.  Patient Self Care Activities:  . Patient will collaborate w/ interdisciplinary team for medication access needs. . Patient will check blood glucose BID, document, and provide at future appointments . Patient will check BP daily, document, and provide at future appointments . Patient will take medications as prescribed . Patient will report any questions or concerns to provider   Please see past updates related to this goal by clicking on the "Past Updates" button in the selected goal         The patient verbalized understanding of instructions provided today and declined a print copy of patient instruction materials.      Plan:  - PCP f/u in ~6 weeks. Scheduled f/u phone call in ~ 12 weeks  Catie Darnelle Maffucci, PharmD, [place], [place] Pharmacist [place] [phone]

## 2019-11-19 NOTE — Chronic Care Management (AMB) (Signed)
Chronic Care Management   Follow Up Note   11/19/2019 Name: Dakota Gonzalez MRN: [id number] DOB: [birth year]  Referred by: [name], MD Reason for referral : Chronic Care Management (Medication Management)   Dakota Gonzalez is a 74 y.o. year old male who is a primary care patient of Caryl Bis, Angela Adam, MD. The CCM team was consulted for assistance with chronic disease management and care coordination needs.    Contacted patient for medication management review.  Review of patient status, including review of consultants reports, relevant laboratory and other test results, and collaboration with appropriate care team members and the patient's provider was performed as part of comprehensive patient evaluation and provision of chronic care management services.    SDOH (Social Determinants of Health) assessments performed: Yes See Care Plan activities for detailed interventions related to SDOH)  SDOH Interventions     Most Recent Value  SDOH Interventions  SDOH Interventions for the Following Domains Financial Strain  Financial Strain Interventions Other (Comment)  [manufacturer assistance]  Physical Activity Interventions Intervention Not Indicated       Outpatient Encounter Medications as of 11/19/2019  Medication Sig  . aspirin 81 MG tablet Take 81 mg by mouth daily.  Marland Kitchen atorvastatin (LIPITOR) 40 MG tablet TAKE 1 TABLET(40 MG) BY MOUTH DAILY  . blood glucose meter kit and supplies One touch ultra, test once daily, E11.9  . Coenzyme Q10 (COQ-10) 100 MG CAPS Take 1 tablet by mouth daily.  . empagliflozin (JARDIANCE) 25 MG TABS tablet Take 1 tablet (25 mg total) by mouth daily.  . finasteride (PROSCAR) 5 MG tablet TAKE 1 TABLET(5 MG) BY MOUTH DAILY  . glucose blood (ONE TOUCH ULTRA TEST) test strip TEST TWICE A DAILY AS DIRECTED  . Lancets Misc. MISC ONETOUCH DELICA LANCETS FINE MISC check blood sugar twice daily  . lisinopril (ZESTRIL) 20 MG tablet TAKE 1 TABLET(20 MG) BY  MOUTH DAILY  . metFORMIN (GLUCOPHAGE) 500 MG tablet TAKE 2 TABLETS(1000 MG) BY MOUTH TWICE DAILY WITH A MEAL  . Multiple Vitamins-Minerals (CENTRUM SILVER PO) Take 1 tablet by mouth daily.  . tamsulosin (FLOMAX) 0.4 MG CAPS capsule TAKE 1 CAPSULE(0.4 MG) BY MOUTH DAILY  . cyclobenzaprine (FLEXERIL) 10 MG tablet Take 1 tablet (10 mg total) by mouth every 8 (eight) hours as needed for muscle spasms (do not drive while using, may make you drowsy). (Patient not taking: Reported on 11/19/2019)  . [DISCONTINUED] amLODipine (NORVASC) 2.5 MG tablet TAKE 1 TABLET(2.5 MG) BY MOUTH DAILY   No facility-administered encounter medications on file as of 11/19/2019.     Objective:   Goals Addressed              This Visit's Progress     Patient Stated   .  PharmD "I want to keep my health controlled" (pt-stated)        [place] (see longtitudinal plan of care for additional care plan information)  Current Barriers:  . Social, financial, and community barriers:  o None at this time. Due to reapply for Jardiance assistance through [facility] for 2022 . Diabetes: controlled, complicated by chronic medical conditions including HTN, HLD, BPH most recent A1c 6.3% . Most recent eGFR: ~77 ml/min . Current antihyperglycemic regimen: metformin 1000 mg BID, Jardiance 25 mg daily  o APPROVED for Jardiance assistance through [place] through 01/15/20 . Current glucose readings: o Fastings and pre-supper: 100s-120s o 2 hour post prandial: ~150-160 . Current exercise:  o Gym three days per week.  Marland Kitchen  Cardiovascular risk reduction: o Current hypertensive regimen: amlodipine 2.5 mg daily- has been holding, lisinopril 20 mg daily;  - Home BP readings: 100-110/60; 2 readings w/ BP up to 120; no episodes of SBP <100 as before when taking amlodipine o Current hyperlipidemia regimen: atorvastatin 40 mg; LDL at goal <70 o Current antiplatelet regimen: ASA 81 mg daily . BPH: finasteride 5 mg daily, tamsulosin 0.4 mg daily;  endorses good tolerability and efficacy of this regimen. . Eye exam: DUE . Nephropathy screening: up to date . Foot exam: up to date . Vaccines: due for annual flu shot; up to date on COVID vaccines  Pharmacist Clinical Goal(s):  Marland Kitchen Over the next 90 days, patient will work with PharmD and primary care provider to address optimized glucose readings  Interventions: . Comprehensive medication review performed, medication list updated in electronic medical record . Inter-disciplinary care team collaboration (see longitudinal plan of care) . Reviewed goal A1c, goal fasting, and goal 2 hour post prandial. Praised for continued maintenance of goal glucose readings. Continue metformin 1000 mg BID and Jardiance 25 mg daily.  . Due to reapply for [facility] assistance. Patient meets new income requirement. Will collaborate w/ CPhT, provider to mail patient assistance application, complete, and submit to BI.  Marland Kitchen Discontinue amlodipine. Continue lisinopril 20 mg daily. Refills up to date. . Lipids at goal. Continue atorvastatin 40 mg daily. Refills up to date.  Patient Self Care Activities:  . Patient will collaborate w/ interdisciplinary team for medication access needs. . Patient will check blood glucose BID, document, and provide at future appointments . Patient will check BP daily, document, and provide at future appointments . Patient will take medications as prescribed . Patient will report any questions or concerns to provider   Please see past updates related to this goal by clicking on the "Past Updates" button in the selected goal          Plan:  - PCP f/u in ~6 weeks. Scheduled f/u phone call in ~ 12 weeks  Catie Darnelle Maffucci, PharmD, [place], [place] Pharmacist [place] [phone]

## 2019-11-27 ENCOUNTER — Telehealth: Payer: Medicare Other

## 2019-12-01 ENCOUNTER — Ambulatory Visit: Payer: Medicare Other | Admitting: Pharmacist

## 2019-12-01 DIAGNOSIS — E1159 Type 2 diabetes mellitus with other circulatory complications: Secondary | ICD-10-CM

## 2019-12-01 DIAGNOSIS — I10 Essential (primary) hypertension: Secondary | ICD-10-CM

## 2019-12-01 DIAGNOSIS — I2510 Atherosclerotic heart disease of native coronary artery without angina pectoris: Secondary | ICD-10-CM

## 2019-12-01 NOTE — Chronic Care Management (AMB) (Signed)
**Note Dakota-Identified via Obfuscation** Chronic Care Management   Pharmacy Note  12/01/2019 Name: Dakota Gonzalez MRN: [id number] DOB: 11-Jan-1946   Subjective:  Dakota Gonzalez is a 74 y.o. year old male who is a primary care patient of Caryl Bis, Angela Adam, MD. The CCM team was consulted for assistance with chronic disease management and care coordination needs.    Engaged with patient by telephone for follow up visit in response to provider referral for pharmacy case management and/or care coordination services.   Consent to Services:  Dakota Gonzalez was given information about Chronic Care Management services, agreed to services, and gave verbal consent prior to initiation of services on 05/25/19. Please see initial visit note for detailed documentation.   Review of patient status, including review of consultants reports, laboratory and other test data, was performed as part of comprehensive evaluation and provision of chronic care management services.   SDOH (Social Determinants of Health) assessments and interventions performed:  SDOH Interventions     Most Recent Value  SDOH Interventions  Financial Strain Interventions Other (Comment)  [manufacturer assistance]       Objective:  Lab Results  Component Value Date   CREATININE 0.95 04/13/2019   CREATININE 0.88 08/28/2018   CREATININE 0.99 04/09/2018    Lab Results  Component Value Date   HGBA1C 6.3 (A) 07/14/2019       Component Value Date/Time   CHOL 130 04/13/2019 1058   TRIG 119.0 04/13/2019 1058   HDL 43.20 04/13/2019 1058   CHOLHDL 3 04/13/2019 1058   VLDL 23.8 04/13/2019 1058   LDLCALC 63 04/13/2019 1058   LDLDIRECT 70.0 08/29/2016 0940    Clinical ASCVD: No  The 10-year ASCVD risk score Dakota Gonzalez DC Jr., et al., 2013) is: 42%   Values used to calculate the score:     Age: 57 years     Sex: Male     Is Non-Hispanic African American: No     Diabetic: Yes     Tobacco smoker: No     Systolic Blood Pressure: 540 mmHg     Is BP treated: Yes      HDL Cholesterol: 43.2 mg/dL     Total Cholesterol: 130 mg/dL    BP Readings from Last 3 Encounters:  07/14/19 135/80  04/13/19 (!) 160/80  04/09/18 140/90    Assessment:   Allergies  Allergen Reactions  . Aspartame Other (See Comments)    Medications Reviewed Today    Reviewed by Dakota Gonzalez, RPH-CPP (Pharmacist) on 11/19/19 at 1359  Med List Status: <None>  Medication Order Taking? Sig Documenting Provider Last Dose Status Informant  aspirin 81 MG tablet [id number] Yes Take 81 mg by mouth daily. [provider] Taking Active   atorvastatin (LIPITOR) 40 MG tablet [id number] Yes TAKE 1 TABLET(40 MG) BY MOUTH DAILY [name], MD Taking Active   blood glucose meter kit and supplies [id number] Yes One touch ultra, test once daily, E11.9 [name], MD Taking Active   Coenzyme Q10 (COQ-10) 100 MG CAPS [id number] Yes Take 1 tablet by mouth daily. [provider] Taking Active   cyclobenzaprine (FLEXERIL) 10 MG tablet [id number] No Take 1 tablet (10 mg total) by mouth every 8 (eight) hours as needed for muscle spasms (do not drive while using, may make you drowsy).  Patient not taking: Reported on 11/19/2019   Jackolyn Confer, MD Not Taking Active   empagliflozin (JARDIANCE) 25 MG TABS tablet [id number] Yes Take 1 tablet (25 mg total) by mouth daily.  [name], MD Taking Active   finasteride (PROSCAR) 5 MG tablet [id number] Yes TAKE 1 TABLET(5 MG) BY MOUTH DAILY [name], MD Taking Active   glucose blood (ONE TOUCH ULTRA TEST) test strip [id number] Yes TEST TWICE A DAILY AS DIRECTED [name], MD Taking Active   Lancets Misc. [place] [id number] Yes ONETOUCH DELICA LANCETS FINE MISC check blood sugar twice daily [name], MD Taking Active   lisinopril (ZESTRIL) 20 MG tablet [id number] Yes TAKE 1 TABLET(20 MG) BY MOUTH DAILY [name], MD Taking Active   metFORMIN (GLUCOPHAGE) 500 MG tablet [id number] Yes TAKE 2  TABLETS(1000 MG) BY MOUTH TWICE DAILY WITH A MEAL [name], MD Taking Active   Multiple Vitamins-Minerals (CENTRUM SILVER PO) [id number] Yes Take 1 tablet by mouth daily. [provider] Taking Active   tamsulosin (FLOMAX) 0.4 MG CAPS capsule [id number] Yes TAKE 1 CAPSULE(0.4 MG) BY MOUTH DAILY [name], MD Taking Active           Patient Active Problem List   Diagnosis Date Noted  . Adrenal adenoma, left 10/09/2017  . BPH (benign prostatic hyperplasia) 06/20/2017  . Onychomycosis 03/18/2017  . Right knee pain 11/18/2015  . Coronary atherosclerosis of native coronary artery 11/09/2014  . Obesity (BMI 30-39.9) 03/02/2013  . HTN (hypertension) 02/26/2012  . Incomplete emptying of bladder 02/11/2012  . Nodular prostate with urinary obstruction 02/11/2012  . Restless legs 01/21/2012  . Diabetes mellitus type 2, controlled ([place]) 07/09/2011  . Hyperlipidemia 07/09/2011  . Elevated PSA 07/09/2011  . Chronic back pain 07/09/2011    Medication Assistance: Application for Jardiance ([place]) medication assistance program in process. Anticipated assistance start date 01/2020. See plan of care below for additional detail.   Patient Care Plan: Medication Management    Problem Identified: Diabetes, Hypertension     Long-Range Goal: Disease Progression Prevention   This Visit's Progress: On track  Priority: High  Note:   Current Barriers:  . Unable to independently afford treatment regimen  Pharmacist Clinical Goal(s):  Marland Kitchen Over the next 30 days, patient will verbalize ability to afford treatment regimen through collaboration with PharmD and provider.  . Over the next 90 days, patient will maintain control of diabetes as evidenced by A1c through collaboration with PharmD and provider.   Interventions: . Inter-disciplinary care team collaboration (see longitudinal plan of care) . Comprehensive medication review performed; medication list updated in electronic medical  record  Diabetes: . Controlled; current treatment: metformin 1000 mg BID, Jardiance 25 mg daily o Due to reapply for Jardiance through [employer] . Current glucose readings: fasting glucose: 100-120s post prandial glucose: 150-160s . Current exercise: 30-45 minutes three days weekly . Antiplatelet therapy: aspirin 81 mg daily . Recommended to continue current regimen . Collaborated with CPhT and provider on 9937 reapplication for patient assistance. Answered patient questions regarding application. He notes he will complete and mail back to CPhT this week  Hypertension: . Controlled; current treatment: lisinopril 20 mg daily  . Current home readings: 100-110/60 . Recommended to continue current regimen  Hyperlipidemia: . Controlled; current treatment: atorvastatin 40 mg daily . Recommended to continue current treatment regimen  BPH: . Controlled per patient report; current treatment: finasteride 5 mg daily, tamsulosin 0.4 mg daily  Patient Goals/Self-Care Activities . Over the next 30 days, patient will:  - take medications as prescribed collaborate with provider on medication access solutions  Follow Up Plan: Telephone follow up appointment with care management team member scheduled  for: ~8 weeks       Plan: Telephone follow up appointment with care management team member scheduled for: 02/22/2020  Catie Darnelle Maffucci, PharmD, BCACP, [place] Pharmacist [place] [phone]

## 2019-12-01 NOTE — Patient Instructions (Signed)
Visit Information  Patient Care Plan: Medication Management    Problem Identified: Diabetes, Hypertension     Long-Range Goal: Disease Progression Prevention   This Visit's Progress: On track  Priority: High  Note:   Current Barriers:   Unable to independently afford treatment regimen  Pharmacist Clinical Goal(s):   Over the next 30 days, patient will verbalize ability to afford treatment regimen through collaboration with PharmD and provider.   Over the next 90 days, patient will maintain control of diabetes as evidenced by A1c through collaboration with PharmD and provider.   Interventions:  Inter-disciplinary care team collaboration (see longitudinal plan of care)  Comprehensive medication review performed; medication list updated in electronic medical record  Diabetes:  Controlled; current treatment: metformin 1000 mg BID, Jardiance 25 mg daily o Due to reapply for Jardiance through [employer]  Current glucose readings: fasting glucose: 100-120s post prandial glucose: 150-160s  Current exercise: 30-45 minutes three days weekly  Antiplatelet therapy: aspirin 81 mg daily  Recommended to continue current regimen  Collaborated with CPhT and provider on 1655 reapplication for patient assistance. Answered patient questions regarding application. He notes he will complete and mail back to CPhT this week  Hypertension:  Controlled; current treatment: lisinopril 20 mg daily   Current home readings: 100-110/60  Recommended to continue current regimen  Hyperlipidemia:  Controlled; current treatment: atorvastatin 40 mg daily  Recommended to continue current treatment regimen  BPH:  Controlled per patient report; current treatment: finasteride 5 mg daily, tamsulosin 0.4 mg daily  Patient Goals/Self-Care Activities  Over the next 30 days, patient will:  - take medications as prescribed collaborate with provider on medication access solutions  Follow Up Plan:  Telephone follow up appointment with care management team member scheduled for: ~8 weeks      The patient verbalized understanding of instructions, educational materials, and care plan provided today and declined offer to receive copy of patient instructions, educational materials, and care plan.    Plan: Telephone follow up appointment with care management team member scheduled for: 02/22/2020  Catie Darnelle Maffucci, PharmD, BCACP, [place] Pharmacist [place] [phone]

## 2019-12-07 ENCOUNTER — Telehealth: Payer: Self-pay

## 2019-12-07 NOTE — Telephone Encounter (Signed)
I called the patient back and he had a question for Catie and I asked her the questions and gave him her response about a form and he understood.  Dakota Gonzalez,cma

## 2019-12-07 NOTE — Telephone Encounter (Signed)
Pt would like to speak with you but did not disclose why. Please advise

## 2019-12-23 ENCOUNTER — Encounter: Payer: Self-pay | Admitting: Family Medicine

## 2019-12-28 ENCOUNTER — Other Ambulatory Visit: Payer: Self-pay | Admitting: Family Medicine

## 2020-01-13 ENCOUNTER — Ambulatory Visit: Payer: Medicare Other | Admitting: Family Medicine

## 2020-01-14 ENCOUNTER — Encounter: Payer: Self-pay | Admitting: Family Medicine

## 2020-01-14 ENCOUNTER — Ambulatory Visit (INDEPENDENT_AMBULATORY_CARE_PROVIDER_SITE_OTHER): Payer: Medicare Other | Admitting: Family Medicine

## 2020-01-14 ENCOUNTER — Other Ambulatory Visit: Payer: Self-pay

## 2020-01-14 DIAGNOSIS — E1159 Type 2 diabetes mellitus with other circulatory complications: Secondary | ICD-10-CM | POA: Diagnosis not present

## 2020-01-14 DIAGNOSIS — I10 Essential (primary) hypertension: Secondary | ICD-10-CM

## 2020-01-14 DIAGNOSIS — N400 Enlarged prostate without lower urinary tract symptoms: Secondary | ICD-10-CM | POA: Diagnosis not present

## 2020-01-14 DIAGNOSIS — G2581 Restless legs syndrome: Secondary | ICD-10-CM

## 2020-01-14 NOTE — Assessment & Plan Note (Signed)
Seems to be well controlled.  Check A1c.  He will continue Jardiance 25 mg daily and Metformin 1000 mg twice daily.

## 2020-01-14 NOTE — Progress Notes (Signed)
  Marikay Alar, MD Phone: [phone]  Dakota Gonzalez is a 74 y.o. male who presents today for f/u.  BPH: Strain- no Flow- good Frequency- no Urgency- some Nocturia- 2x Dysuria- no Emptying bladder- yes Medication- flomax Notes he is happy with this issue at this time.  DIABETES Disease Monitoring: Blood Sugar ranges-110s-120s, excursions to 150 Polyuria/phagia/dipsia- no      Optho- UTD Medications: Compliance- taking jardiance, metformin Hypoglycemic symptoms- no The patient has been exercising daily and monitoring his diet.  HYPERTENSION  Disease Monitoring  Home BP Monitoring 110/60s Chest pain- no    Dyspnea- no Medications  Compliance-  Taking lisinopril 20 mg daily.   Edema- no  Restless leg syndrome: Patient notes this is quite a bit better than it was.  Occasionally will take a Flexeril though notes that just makes him drowsy and he is able to sleep easier.  Notes this has been improved over the last month.    Social History   Tobacco Use  Smoking Status Former Smoker  Smokeless Tobacco Never Used     ROS see history of present illness  Objective  Physical Exam Vitals:   01/14/20 1414  BP: 128/74  Pulse: 93  Temp: 98.1 F (36.7 C)  SpO2: 98%    BP Readings from Last 3 Encounters:  01/14/20 128/74  07/14/19 135/80  04/13/19 (!) 160/80   Wt Readings from Last 3 Encounters:  01/14/20 205 lb (93 kg)  07/14/19 215 lb 1.6 oz (97.6 kg)  04/30/19 229 lb (103.9 kg)    Physical Exam Constitutional:      General: He is not in acute distress.    Appearance: He is not diaphoretic.  Cardiovascular:     Rate and Rhythm: Normal rate and regular rhythm.     Heart sounds: Normal heart sounds.  Pulmonary:     Effort: Pulmonary effort is normal.     Breath sounds: Normal breath sounds.  Musculoskeletal:        General: No edema.     Right lower leg: No edema.     Left lower leg: No edema.  Skin:    General: Skin is warm and dry.   Neurological:     Mental Status: He is alert.      Assessment/Plan: Please see individual problem list.  Problem List Items Addressed This Visit    BPH (benign prostatic hyperplasia)    Adequately controlled.  He will continue Flomax 1 capsule daily and finasteride 5 mg daily.      Diabetes mellitus type 2, controlled (HCC) (Chronic)    Seems to be well controlled.  Check A1c.  He will continue Jardiance 25 mg daily and Metformin 1000 mg twice daily.      Relevant Orders   HgB A1c   HTN (hypertension) (Chronic)    Well-controlled.  He will continue lisinopril 20 mg daily.  We will check a BMP.      Relevant Orders   Basic Metabolic Panel (BMET)   Restless legs    Check ferritin.      Relevant Orders   Ferritin      This visit occurred during the SARS-CoV-2 public health emergency.  Safety protocols were in place, including screening questions prior to the visit, additional usage of staff PPE, and extensive cleaning of exam room while observing appropriate contact time as indicated for disinfecting solutions.    Marikay Alar, MD [facility]

## 2020-01-14 NOTE — Assessment & Plan Note (Signed)
Check ferritin 

## 2020-01-14 NOTE — Patient Instructions (Signed)
Nice to see you. We will check lab work today. Please keep up the diet and the exercise changes.

## 2020-01-14 NOTE — Assessment & Plan Note (Signed)
Adequately controlled.  He will continue Flomax 1 capsule daily and finasteride 5 mg daily.

## 2020-01-14 NOTE — Assessment & Plan Note (Signed)
Well-controlled.  He will continue lisinopril 20 mg daily.  We will check a BMP.

## 2020-01-15 LAB — BASIC METABOLIC PANEL
BUN: 20 mg/dL (ref 7–25)
CO2: 25 mmol/L (ref 20–32)
Calcium: 10.6 mg/dL — ABNORMAL HIGH (ref 8.6–10.3)
Chloride: 102 mmol/L (ref 98–110)
Creat: 1.11 mg/dL (ref 0.70–1.18)
Glucose, Bld: 114 mg/dL — ABNORMAL HIGH (ref 65–99)
Potassium: 4.5 mmol/L (ref 3.5–5.3)
Sodium: 138 mmol/L (ref 135–146)

## 2020-01-15 LAB — FERRITIN: Ferritin: 120 ng/mL (ref 24–380)

## 2020-01-15 LAB — HEMOGLOBIN A1C
Hgb A1c MFr Bld: 7.4 % of total Hgb — ABNORMAL HIGH (ref <5.7)
Mean Plasma Glucose: 166 mg/dL
eAG (mmol/L): 9.2 mmol/L

## 2020-01-17 ENCOUNTER — Encounter: Payer: Self-pay | Admitting: Family Medicine

## 2020-01-18 ENCOUNTER — Other Ambulatory Visit: Payer: Self-pay | Admitting: Family Medicine

## 2020-01-24 ENCOUNTER — Other Ambulatory Visit: Payer: Self-pay | Admitting: Family Medicine

## 2020-01-26 ENCOUNTER — Telehealth: Payer: Self-pay

## 2020-01-26 NOTE — Telephone Encounter (Signed)
-----   Message from [name], MD sent at 01/25/2020  4:37 PM EST ----- Please let the patient know his calcium was mildly elevated on his prior lab work.  We should recheck that sometime in the next week or 2.  Please place an order for a BMP for a diagnosis of hypercalcemia.  Thanks.

## 2020-02-09 ENCOUNTER — Other Ambulatory Visit (INDEPENDENT_AMBULATORY_CARE_PROVIDER_SITE_OTHER): Payer: Medicare Other

## 2020-02-09 ENCOUNTER — Other Ambulatory Visit: Payer: Self-pay

## 2020-02-09 LAB — BASIC METABOLIC PANEL
BUN: 13 mg/dL (ref 6–23)
CO2: 29 mEq/L (ref 19–32)
Calcium: 10.1 mg/dL (ref 8.4–10.5)
Chloride: 104 mEq/L (ref 96–112)
Creatinine, Ser: 0.94 mg/dL (ref 0.40–1.50)
GFR: 80.36 mL/min (ref >60.00)
Glucose, Bld: 128 mg/dL — ABNORMAL HIGH (ref 70–99)
Potassium: 4 mEq/L (ref 3.5–5.1)
Sodium: 140 mEq/L (ref 135–145)

## 2020-02-22 ENCOUNTER — Ambulatory Visit (INDEPENDENT_AMBULATORY_CARE_PROVIDER_SITE_OTHER): Payer: Medicare Other | Admitting: Pharmacist

## 2020-02-22 DIAGNOSIS — E1159 Type 2 diabetes mellitus with other circulatory complications: Secondary | ICD-10-CM | POA: Diagnosis not present

## 2020-02-22 DIAGNOSIS — I10 Essential (primary) hypertension: Secondary | ICD-10-CM

## 2020-02-22 DIAGNOSIS — E785 Hyperlipidemia, unspecified: Secondary | ICD-10-CM

## 2020-02-22 NOTE — Patient Instructions (Signed)
Visit Information  PATIENT GOALS: Goals Addressed              This Visit's Progress     Patient Stated   .  Self Monitoring (pt-stated)        Patient Goals/Self-Care Activities . Over the next 30 days, patient will:  - take medications as prescribed collaborate with provider on medication access solutions        Patient verbalizes understanding of instructions provided today and agrees to view in [place].   Plan: Telephone follow up appointment with care management team member scheduled for:  ~8 weeks  Catie Darnelle Maffucci, PharmD, [place], [place] Clinical Pharmacist [employer] [facility] [employer] [phone]

## 2020-02-22 NOTE — Chronic Care Management (AMB) (Signed)
Chronic Care Management Pharmacy Note  02/22/2020 Name:  Dakota Gonzalez MRN:  [id number] DOB:  [birth year]  Subjective: Dakota Gonzalez is an 75 y.o. year old male who is a primary patient of Caryl Bis, Angela Adam, MD.  The CCM team was consulted for assistance with disease management and care coordination needs.    Engaged with patient by telephone for follow up visit in response to provider referral for pharmacy case management and/or care coordination services.   Consent to Services:  The patient was given information about Chronic Care Management services, agreed to services, and gave verbal consent prior to initiation of services.  Please see initial visit note for detailed documentation.   Objective:  Lab Results  Component Value Date   CREATININE 0.94 02/09/2020   CREATININE 1.11 01/14/2020   CREATININE 0.95 04/13/2019    Lab Results  Component Value Date   HGBA1C 7.4 (H) 01/14/2020       Component Value Date/Time   CHOL 130 04/13/2019 1058   TRIG 119.0 04/13/2019 1058   HDL 43.20 04/13/2019 1058   CHOLHDL 3 04/13/2019 1058   VLDL 23.8 04/13/2019 1058   LDLCALC 63 04/13/2019 1058   LDLDIRECT 70.0 08/29/2016 0940   Clinical ASCVD: No  The 10-year ASCVD risk score Mikey Bussing DC Jr., et al., 2013) is: 39.1%   Values used to calculate the score:     Age: 38 years     Sex: Male     Is Non-Hispanic African American: No     Diabetic: Yes     Tobacco smoker: No     Systolic Blood Pressure: 097 mmHg     Is BP treated: Yes     HDL Cholesterol: 43.2 mg/dL     Total Cholesterol: 130 mg/dL     BP Readings from Last 3 Encounters:  01/14/20 128/74  07/14/19 135/80  04/13/19 (!) 160/80    Assessment: Review of patient past medical history, allergies, medications, health status, including review of consultants reports, laboratory and other test data, was performed as part of comprehensive evaluation and provision of chronic care management services.   SDOH:  (Social  Determinants of Health) assessments and interventions performed:    CCM Care Plan  Allergies  Allergen Reactions  . Aspartame Other (See Comments)    Medications Reviewed Today    Reviewed by Avie Arenas, RPH (Pharmacist) on 02/22/20 at 0953  Med List Status: <None>  Medication Order Taking? Sig Documenting Provider Last Dose Status Informant  aspirin 81 MG tablet [id number] Yes Take 81 mg by mouth daily. [provider] Taking Active   atorvastatin (LIPITOR) 40 MG tablet [id number] Yes TAKE 1 TABLET(40 MG) BY MOUTH DAILY [name], MD Taking Active   blood glucose meter kit and supplies [id number] Yes One touch ultra, test once daily, E11.9 [name], MD Taking Active   Coenzyme Q10 (COQ-10) 100 MG CAPS [id number] Yes Take 1 tablet by mouth daily. [provider] Taking Active   cyclobenzaprine (FLEXERIL) 10 MG tablet [id number] No Take 1 tablet (10 mg total) by mouth every 8 (eight) hours as needed for muscle spasms (do not drive while using, may make you drowsy).  Patient not taking: Reported on 02/22/2020   Jackolyn Confer, MD Not Taking Active   empagliflozin (JARDIANCE) 25 MG TABS tablet [id number] Yes Take 1 tablet (25 mg total) by mouth daily. [name], MD Taking Active   finasteride (PROSCAR) 5 MG tablet [id number] Yes TAKE 1 TABLET(5  MG) BY MOUTH DAILY [name], MD Taking Active   glucose blood (ONE TOUCH ULTRA TEST) test strip [id number]  TEST TWICE A DAILY AS DIRECTED [name], MD  Active   Lancets Misc. [place] [id number]  ONETOUCH DELICA LANCETS FINE MISC check blood sugar twice daily [name], MD  Active   lisinopril (ZESTRIL) 20 MG tablet [id number] Yes TAKE 1 TABLET(20 MG) BY MOUTH DAILY [name], MD Taking Active   metFORMIN (GLUCOPHAGE) 500 MG tablet [id number] Yes TAKE 2 TABLETS(1000 MG) BY MOUTH TWICE DAILY WITH A MEAL [name], MD Taking Active   Multiple Vitamins-Minerals (CENTRUM SILVER  PO) [id number] Yes Take 1 tablet by mouth daily. [provider] Taking Active   tamsulosin (FLOMAX) 0.4 MG CAPS capsule [id number] Yes TAKE 1 CAPSULE(0.4 MG) BY MOUTH DAILY [name], MD Taking Active           Patient Active Problem List   Diagnosis Date Noted  . Adrenal adenoma, left 10/09/2017  . BPH (benign prostatic hyperplasia) 06/20/2017  . Onychomycosis 03/18/2017  . Right knee pain 11/18/2015  . Coronary atherosclerosis of native coronary artery 11/09/2014  . Obesity (BMI 30-39.9) 03/02/2013  . HTN (hypertension) 02/26/2012  . Incomplete emptying of bladder 02/11/2012  . Nodular prostate with urinary obstruction 02/11/2012  . Restless legs 01/21/2012  . Diabetes mellitus type 2, controlled ([place]) 07/09/2011  . Hyperlipidemia 07/09/2011  . Elevated PSA 07/09/2011  . Chronic back pain 07/09/2011    Conditions to be addressed/monitored: CAD, HTN, HLD and DMII  Care Plan : Medication Management  Updates made by De Hollingshead, RPH-CPP since 02/22/2020 12:00 AM    Problem: Diabetes, Hypertension     Long-Range Goal: Disease Progression Prevention   This Visit's Progress: On track  Recent Progress: On track  Priority: High  Note:   Current Barriers:  . Unable to achieve control of diabetes   Pharmacist Clinical Goal(s):  Marland Kitchen Over the next 30 days, patient will verbalize ability to afford treatment regimen through collaboration with PharmD and provider.  . Over the next 90 days, patient will maintain control of diabetes as evidenced by A1c through collaboration with PharmD and provider. .  Interventions: . 1:1 collaboration with [name], MD regarding development and update of comprehensive plan of care as evidenced by provider attestation and co-signature . Inter-disciplinary care team collaboration (see longitudinal plan of care) . Comprehensive medication review performed; medication list updated in electronic medical  record  Diabetes: . Uncontrolled; current treatment: metformin 1000 mg BID, Jardiance 25 mg daily o Enrolled in [employer] for Jardiance patient assistance . Current glucose readings: post prandial glucose: 130-150s o Reports concern of few sugars close to 200 - occurs due to dietary indiscretions (chips, potatoes) . Current exercise: ~5x/weekly (15-20 mins) - treadmill, fast walking for 1 mile, weight training, and [employer] occasionally . Current diet: Breakfast: bagel w/peanut butter sometimes and coffee w/no sugar; lunch: soup and sandwhich w/chips occasionally; Dinner: substituting salads for potatoes, vegetables, and meat (chicken or [place])  . Recommended to continue current regimen . Encouraged patient to focus on a heart-healthy and diabetes-friendly diet and to continue exercising for 20-30 minutes 5x/weekly  Hypertension: . Controlled; current treatment: lisinopril 20 mg qAM  . Current home readings: 105-110/60s . Denies dizziness and lightheadedness  . Recommended to continue current regimen  Hyperlipidemia and ASCVD prevention with hx of CAD . Controlled (goal<70); current treatment: atorvastatin 40 mg daily and aspirin 81 mg  daily . Recommended to continue current treatment regimen  BPH: . Controlled; current treatment: finasteride 5 mg daily, tamsulosin 0.4 mg daily  Patient Goals/Self-Care Activities . Over the next 90 days, patient will:  - take medications as prescribed collaborate with provider on medication access solutions  Follow Up Plan: Telephone follow up appointment with care management team member scheduled for: ~8 weeks      Medication Assistance: Jardianceobtained through [place] medication assistance program.  Enrollment ends 01/14/2021  Follow Up:  Patient agrees to Care Plan and Follow-up.  Plan: Telephone follow up appointment with care management team member scheduled for:  ~8 weeks  Catie Darnelle Maffucci, PharmD, [place], [place] Clinical  Pharmacist [employer] at [employer] [phone]

## 2020-04-03 ENCOUNTER — Other Ambulatory Visit: Payer: Self-pay | Admitting: Family Medicine

## 2020-04-23 ENCOUNTER — Other Ambulatory Visit: Payer: Self-pay | Admitting: Family Medicine

## 2020-04-25 ENCOUNTER — Encounter: Payer: Self-pay | Admitting: Family Medicine

## 2020-04-25 ENCOUNTER — Ambulatory Visit: Payer: Medicare Other | Admitting: Pharmacist

## 2020-04-25 ENCOUNTER — Telehealth: Payer: Self-pay | Admitting: Pharmacist

## 2020-04-25 DIAGNOSIS — E1165 Type 2 diabetes mellitus with hyperglycemia: Secondary | ICD-10-CM

## 2020-04-25 DIAGNOSIS — I10 Essential (primary) hypertension: Secondary | ICD-10-CM

## 2020-04-25 DIAGNOSIS — E785 Hyperlipidemia, unspecified: Secondary | ICD-10-CM

## 2020-04-25 NOTE — Patient Instructions (Signed)
  Visit Information  PATIENT GOALS: Goals Addressed              This Visit's Progress   .  Medication Management (pt-stated)        Patient Goals/Self-Care Activities . Over the next 30 days, patient will:  - take medications as prescribed collaborate with provider on medication access solutions.        Patient verbalizes understanding of instructions provided today and agrees to view in [place].   Telephone follow up appointment with care management team member scheduled for: ~10 weeks  Lorel Monaco, PharmD, [place]

## 2020-04-25 NOTE — Telephone Encounter (Signed)
Noted.  Can we get him set up for a same-day visit with me on Wednesday?

## 2020-04-25 NOTE — Telephone Encounter (Signed)
See phone note

## 2020-04-25 NOTE — Chronic Care Management (AMB) (Addendum)
**Note Dakota-Identified via Obfuscation** Chronic Care Management Pharmacy Note  04/25/2020 Name:  Dakota Gonzalez MRN:  [id number] DOB:  1945/05/14  Subjective: Dakota Gonzalez is an 75 y.o. year old male who is a primary patient of Dakota Gonzalez, Dakota Adam, MD.  The CCM team was consulted for assistance with disease management and care coordination needs.    Engaged with patient by telephone for follow up visit in response to provider referral for pharmacy case management and/or care coordination services.   Consent to Services:  The patient was given information about Chronic Care Management services, agreed to services, and gave verbal consent prior to initiation of services.  Please see initial visit note for detailed documentation.   Patient Care Team: Dakota Haven, MD as PCP - General (Family Medicine) Dakota Hampshire, MD as Consulting Physician (Cardiology) Dakota Gonzalez, RPH-CPP as Pharmacist (Pharmacist)  Recent office visits: None since last visit  Recent consult visits: None since last visit   Hospital visits: None in previous 6 months  Objective:  Lab Results  Component Value Date   CREATININE 0.94 02/09/2020   CREATININE 1.11 01/14/2020   CREATININE 0.95 04/13/2019    Lab Results  Component Value Date   HGBA1C 7.4 (H) 01/14/2020   Last diabetic Eye exam:  Lab Results  Component Value Date/Time   HMDIABEYEEXA No Retinopathy 11/12/2019 12:00 AM    Last diabetic Foot exam:  Lab Results  Component Value Date/Time   HMDIABFOOTEX Normal 12/14/2013 12:00 AM        Component Value Date/Time   CHOL 130 04/13/2019 1058   TRIG 119.0 04/13/2019 1058   HDL 43.20 04/13/2019 1058   CHOLHDL 3 04/13/2019 1058   VLDL 23.8 04/13/2019 1058   LDLCALC 63 04/13/2019 1058   LDLDIRECT 70.0 08/29/2016 0940    Hepatic Function Latest Ref Rng & Units 04/13/2019 04/09/2018 10/09/2017  Total Protein 6.0 - 8.3 g/dL 7.5 7.8 7.7  Albumin 3.5 - 5.2 g/dL 4.8 4.9 4.7  AST 0 - 37 U/L '24 20 20  ' ALT 0 - 53  U/L '26 27 27  ' Alk Phosphatase 39 - 117 U/L 51 50 50  Total Bilirubin 0.2 - 1.2 mg/dL 1.1 1.3(H) 1.3(H)  Bilirubin, Direct <=0.2 mg/dL - - -    No results found for: TSH, FREET4  CBC Latest Ref Rng & Units 11/28/2014 11/09/2014 12/14/2013  WBC 3.8 - 10.6 K/uL 9.5 8.4 7.8  Hemoglobin 13.0 - 18.0 g/dL 15.0 15.1 14.7  Hematocrit 40.0 - 52.0 % 45.9 45.4 44.6  Platelets 150 - 440 K/uL 159 167.0 167.0    No results found for: VD25OH  Clinical ASCVD: No  The 10-year ASCVD risk score Mikey Bussing DC Jr., et al., 2013) is: 39.1%   Values used to calculate the score:     Age: 34 years     Sex: Male     Is Non-Hispanic African American: No     Diabetic: Yes     Tobacco smoker: No     Systolic Blood Pressure: 277 mmHg     Is BP treated: Yes     HDL Cholesterol: 43.2 mg/dL     Total Cholesterol: 130 mg/dL     Social History   Tobacco Use  Smoking Status Former Smoker  Smokeless Tobacco Never Used   BP Readings from Last 3 Encounters:  01/14/20 128/74  07/14/19 135/80  04/13/19 (!) 160/80   Pulse Readings from Last 3 Encounters:  01/14/20 93  07/14/19 87  04/13/19 100  Wt Readings from Last 3 Encounters:  01/14/20 205 lb (93 kg)  07/14/19 215 lb 1.6 oz (97.6 kg)  04/30/19 229 lb (103.9 kg)    Assessment: Review of patient past medical history, allergies, medications, health status, including review of consultants reports, laboratory and other test data, was performed as part of comprehensive evaluation and provision of chronic care management services.   SDOH:  (Social Determinants of Health) assessments and interventions performed: none today   CCM Care Plan  Allergies  Allergen Reactions  . Aspartame Other (See Comments)    Medications Reviewed Today    Reviewed by Avie Arenas, RPH (Pharmacist) on 04/25/20 at 0950  Med List Status: <None>  Medication Order Taking? Sig Documenting Provider Last Dose Status Informant  aspirin 81 MG tablet [id number] Yes Take 81 mg by  mouth daily. [provider] Taking Active   atorvastatin (LIPITOR) 40 MG tablet [id number] Yes TAKE 1 TABLET(40 MG) BY MOUTH DAILY Dakota Haven, MD Taking Active   blood glucose meter kit and supplies [id number] Yes One touch ultra, test once daily, E11.9 Dakota Haven, MD Taking Active   Coenzyme Q10 (COQ-10) 100 MG CAPS [id number] Yes Take 1 tablet by mouth daily. [provider] Taking Active   cyclobenzaprine (FLEXERIL) 10 MG tablet [id number] Yes Take 1 tablet (10 mg total) by mouth every 8 (eight) hours as needed for muscle spasms (do not drive while using, may make you drowsy). Jackolyn Confer, MD Taking Active            Med Note [facility], Dakota Gonzalez A   Mon Apr 25, 2020  9:50 AM) Uses 1-2x/month  docusate sodium (COLACE) 100 MG capsule [id number] Yes Take 100 mg by mouth daily as needed for mild constipation. [provider] Taking Active Self           Med Note (Dakota Gonzalez A   Mon Apr 25, 2020  9:50 AM) Taking QHS  empagliflozin (JARDIANCE) 25 MG TABS tablet [id number] Yes Take 1 tablet (25 mg total) by mouth daily. Dakota Haven, MD Taking Active   finasteride (PROSCAR) 5 MG tablet [id number] Yes TAKE 1 TABLET(5 MG) BY MOUTH DAILY Dakota Haven, MD Taking Active   glucose blood (ONE TOUCH ULTRA TEST) test strip [id number] Yes TEST TWICE A DAILY AS DIRECTED Dakota Haven, MD Taking Active   Lancets Misc. [place] [id number] Yes ONETOUCH DELICA LANCETS FINE MISC check blood sugar twice daily Dakota Haven, MD Taking Active   lisinopril (ZESTRIL) 20 MG tablet [id number] Yes TAKE 1 TABLET(20 MG) BY MOUTH DAILY Dakota Haven, MD Taking Active   metFORMIN (GLUCOPHAGE) 500 MG tablet [id number] Yes TAKE 2 TABLETS(1000 MG) BY MOUTH TWICE DAILY WITH A MEAL Dakota Haven, MD Taking Active   Multiple Vitamins-Minerals (CENTRUM SILVER PO) [id number] Yes Take 1 tablet by mouth daily. [provider] Taking Active   tamsulosin (FLOMAX) 0.4 MG CAPS  capsule [id number] Yes TAKE 1 CAPSULE(0.4 MG) BY MOUTH DAILY Dakota Haven, MD Taking Active           Patient Active Problem List   Diagnosis Date Noted  . Adrenal adenoma, left 10/09/2017  . BPH (benign prostatic hyperplasia) 06/20/2017  . Onychomycosis 03/18/2017  . Right knee pain 11/18/2015  . Coronary atherosclerosis of native coronary artery 11/09/2014  . Obesity (BMI 30-39.9) 03/02/2013  . HTN (hypertension) 02/26/2012  . Incomplete emptying of bladder 02/11/2012  . Nodular prostate with urinary obstruction 02/11/2012  . Restless  legs 01/21/2012  . Diabetes mellitus type 2, controlled ([place]) 07/09/2011  . Hyperlipidemia 07/09/2011  . Elevated PSA 07/09/2011  . Chronic back pain 07/09/2011    Immunization History  Administered Date(s) Administered  . Fluad Quad(high Dose 65+) 09/26/2018  . Influenza Split 10/17/2011  . Influenza, High Dose Seasonal PF 09/19/2016, 10/03/2017  . Influenza,inj,Quad PF,6+ Mos 09/22/2012, 10/10/2013, 11/10/2014  . Influenza-Unspecified 11/03/2017, 10/20/2019  . Moderna SARS-COV2 Booster Vaccination 10/16/2019  . Moderna Sars-Covid-2 Vaccination 02/27/2019, 03/27/2019  . Pneumococcal Conjugate-13 06/12/2013  . Pneumococcal Polysaccharide-23 10/17/2011  . Tdap 03/20/2017    Conditions to be addressed/monitored: CAD, HTN, HLD, DMII and BPH  Care Plan : Medication Management  Updates made by Tayo Maute A, RPH since 04/25/2020 12:00 AM    Problem: Diabetes, Hypertension     Long-Range Goal: Disease Progression Prevention   This Visit's Progress: On track  Recent Progress: On track  Priority: High  Note:   Current Barriers:  . Unable to achieve control of diabetes   Pharmacist Clinical Goal(s):  Marland Kitchen Over the next 30 days, patient will verbalize ability to afford treatment regimen through collaboration with PharmD and provider.  . Over the next 90 days, patient will maintain control of diabetes as evidenced by A1c through  collaboration with PharmD and provider.  Interventions: . 1:1 collaboration with Dakota Haven, MD regarding development and update of comprehensive plan of care as evidenced by provider attestation and co-signature . Inter-disciplinary care team collaboration (see longitudinal plan of care) . Comprehensive medication review performed; medication list updated in electronic medical record  Diabetes: . Uncontrolled; current treatment: metformin 1000 mg BID, Jardiance 25 mg daily o Enrolled in [employer] for Jardiance patient assistance . Current glucose readings: post prandial glucose: <160 . Denies hypoglycemic events/episodes . Current exercise: ~5x/weekly (15-20 mins) - treadmill, fast walking for 1 mile, weight training, and [employer] occasionally . Current diet: Breakfast: bagel w/peanut butter sometimes and coffee w/no sugar; lunch: soup and sandwhich w/chips occasionally; Dinner: substituting salads for potatoes, vegetables, and meat (chicken or [place])  . Recommended to continue current regimen  Hypertension: . Controlled; current treatment: lisinopril 20 mg qAM  . Current home readings: 110s/60s . Denies dizziness, headaches, fatigue and blurry vision  . Recommended to continue current regimen  Hyperlipidemia and ASCVD prevention with hx of CAD . Controlled (goal<70); current treatment: atorvastatin 40 mg daily and aspirin 81 mg daily . Recommended to continue current treatment regimen  BPH: . Controlled; current treatment: finasteride 5 mg daily, tamsulosin 0.4 mg daily . Reviewed MyChart message with patient where he reported dark urine at the end of his stream last night. Increased urinary frequency and discomfort. Will collaborate w/ PCP for evaluation and treatment.   Patient Goals/Self-Care Activities . Over the next 90 days, patient will:  - take medications as prescribed collaborate with provider on medication access solutions  Follow Up Plan: Telephone  follow up appointment with care management team member scheduled for: ~10 weeks      Medication Assistance: Jardiance obtained through [place] medication assistance program. Enrollment ends 01/14/2021  Patient's preferred pharmacy is:  [facility] [place], [address] [place] [address] Phone: [phone] Fax: [phone]  Follow Up:  Patient agrees to Care Plan and Follow-up.  Plan: Telephone follow up appointment with care management team member scheduled for:  ~10 weeks  Lorel Monaco, PharmD, BCPS PGY2  Ambulatory Care Resident [facility]  Pharmacy   I was present for this visit and agree with the documentation by the resident as above.   Catie Darnelle Maffucci, PharmD, [place], [place] Clinical Pharmacist [employer] [facility] [place]

## 2020-04-25 NOTE — Telephone Encounter (Signed)
Patient is scheduled to see the provider tomorrow.  Nnaemeka Samson,cma

## 2020-04-25 NOTE — Telephone Encounter (Signed)
Dakota Gonzalez sent a message via mychart regarding of a couple of dark drops at the end of urination.  Spoke with him and he reported experiencing urinary frequency and some discomfort while urinating. Also reported "dark drops" but pt doesn't think it's blood. He denied pain, burning, and itching while urinating and dark stools. Symptoms has only occurred twice in the last 1-2 days. No changes in medications and only diet change was added prune juice.

## 2020-04-26 ENCOUNTER — Encounter: Payer: Self-pay | Admitting: Family Medicine

## 2020-04-26 ENCOUNTER — Ambulatory Visit (INDEPENDENT_AMBULATORY_CARE_PROVIDER_SITE_OTHER): Payer: Medicare Other | Admitting: Family Medicine

## 2020-04-26 ENCOUNTER — Other Ambulatory Visit: Payer: Self-pay

## 2020-04-26 VITALS — BP 160/70 | HR 97 | Temp 98.3°F | Ht 70.0 in | Wt 200.2 lb

## 2020-04-26 DIAGNOSIS — I10 Essential (primary) hypertension: Secondary | ICD-10-CM

## 2020-04-26 DIAGNOSIS — R350 Frequency of micturition: Secondary | ICD-10-CM | POA: Diagnosis not present

## 2020-04-26 LAB — URINALYSIS, MICROSCOPIC ONLY

## 2020-04-26 LAB — POCT URINALYSIS DIPSTICK
Bilirubin, UA: NEGATIVE
Glucose, UA: POSITIVE — AB
Ketones, UA: 40
Leukocytes, UA: NEGATIVE
Nitrite, UA: NEGATIVE
Protein, UA: NEGATIVE
Spec Grav, UA: 1.01 (ref 1.010–1.025)
Urobilinogen, UA: 0.2 E.U./dL
pH, UA: 5 (ref 5.0–8.0)

## 2020-04-26 NOTE — Patient Instructions (Signed)
Nice to see you. We will let you know what your urine culture and urine microscopy shows.  If you have recurrence of your symptoms please let us know.

## 2020-04-26 NOTE — Assessment & Plan Note (Signed)
Well-controlled at home.  He has some measure of whitecoat hypertension.  He will continue lisinopril 20 mg once daily.

## 2020-04-26 NOTE — Assessment & Plan Note (Signed)
Undetermined cause.  Symptoms have improved at this time.  His urine dipstick did have moderate blood though we will send this for urine microscopy to confirm.  Also send for urine culture.  Discussed the potential for seeing urology if he does have microscopic blood on his urinalysis with no infection.

## 2020-04-26 NOTE — Progress Notes (Signed)
Dakota Gonzalez °

## 2020-04-26 NOTE — Progress Notes (Signed)
Dakota Rumps, MD Phone: [phone]  MARKO SKALSKI is a 75 y.o. male who presents today for same-day visit.  Urinary frequency: Patient notes last week he developed urinary frequency.  His urine felt abrasive when it was coming out though it did not hurt.  He noted no urgency.  He notes on Sunday he had 2 drops of brownish urine at the end of his urination and this occurred one other time that night.  He notes his symptoms started to improve Monday morning and have not recurred.  No bright red blood.  No abdominal pain.  No penile discharge.  Hypertension: Typically 105-115/60s.  Taking lisinopril.  No chest pain, shortness of breath, or edema.  Social History   Tobacco Use  Smoking Status Former Smoker  Smokeless Tobacco Never Used    Current Outpatient Medications on File Prior to Visit  Medication Sig Dispense Refill  . aspirin 81 MG tablet Take 81 mg by mouth daily.    Marland Kitchen atorvastatin (LIPITOR) 40 MG tablet TAKE 1 TABLET(40 MG) BY MOUTH DAILY 90 tablet 0  . blood glucose meter kit and supplies One touch ultra, test once daily, E11.9 1 each 0  . Coenzyme Q10 (COQ-10) 100 MG CAPS Take 1 tablet by mouth daily.    . cyclobenzaprine (FLEXERIL) 10 MG tablet Take 1 tablet (10 mg total) by mouth every 8 (eight) hours as needed for muscle spasms (do not drive while using, may make you drowsy). 60 tablet 1  . docusate sodium (COLACE) 100 MG capsule Take 100 mg by mouth daily as needed for mild constipation.    . empagliflozin (JARDIANCE) 25 MG TABS tablet Take 1 tablet (25 mg total) by mouth daily. 90 tablet 3  . finasteride (PROSCAR) 5 MG tablet TAKE 1 TABLET(5 MG) BY MOUTH DAILY 90 tablet 1  . glucose blood (ONE TOUCH ULTRA TEST) test strip TEST TWICE A DAILY AS DIRECTED 200 each 5  . Lancets (ONETOUCH DELICA PLUS [id number]) MISC USE TO CHECK BLOOD SUGAR TWICE DAILY    . Lancets Misc. MISC ONETOUCH DELICA LANCETS FINE MISC check blood sugar twice daily 200 each 5  . lisinopril  (ZESTRIL) 20 MG tablet TAKE 1 TABLET(20 MG) BY MOUTH DAILY 90 tablet 1  . metFORMIN (GLUCOPHAGE) 500 MG tablet TAKE 2 TABLETS(1000 MG) BY MOUTH TWICE DAILY WITH A MEAL 360 tablet 3  . Multiple Vitamins-Minerals (CENTRUM SILVER PO) Take 1 tablet by mouth daily.    . tamsulosin (FLOMAX) 0.4 MG CAPS capsule TAKE 1 CAPSULE(0.4 MG) BY MOUTH DAILY 90 capsule 3   No current facility-administered medications on file prior to visit.     ROS see history of present illness  Objective  Physical Exam Vitals:   04/26/20 1254  BP: (!) 160/70  Pulse: 97  Temp: 98.3 F (36.8 C)  SpO2: 98%    BP Readings from Last 3 Encounters:  04/26/20 (!) 160/70  01/14/20 128/74  07/14/19 135/80   Wt Readings from Last 3 Encounters:  04/26/20 200 lb 3.2 oz (90.8 kg)  01/14/20 205 lb (93 kg)  07/14/19 215 lb 1.6 oz (97.6 kg)    Physical Exam Constitutional:      General: He is not in acute distress.    Appearance: He is not diaphoretic.  Cardiovascular:     Rate and Rhythm: Normal rate and regular rhythm.     Heart sounds: Normal heart sounds.  Pulmonary:     Effort: Pulmonary effort is normal.     Breath sounds:  Normal breath sounds.  Abdominal:     General: Bowel sounds are normal. There is no distension.     Palpations: Abdomen is soft.     Tenderness: There is no abdominal tenderness. There is no guarding or rebound.  Skin:    General: Skin is warm and dry.  Neurological:     Mental Status: He is alert.      Assessment/Plan: Please see individual problem list.  Problem List Items Addressed This Visit    HTN (hypertension) (Chronic)    Well-controlled at home.  He has some measure of whitecoat hypertension.  He will continue lisinopril 20 mg once daily.      Urine frequency - Primary    Undetermined cause.  Symptoms have improved at this time.  His urine dipstick did have moderate blood though we will send this for urine microscopy to confirm.  Also send for urine culture.   Discussed the potential for seeing urology if he does have microscopic blood on his urinalysis with no infection.      Relevant Orders   POCT Urinalysis Dipstick (Completed)   Urine Microscopic   Urine Culture      This visit occurred during the SARS-CoV-2 public health emergency.  Safety protocols were in place, including screening questions prior to the visit, additional usage of staff PPE, and extensive cleaning of exam room while observing appropriate contact time as indicated for disinfecting solutions.    Dakota Rumps, MD [place]

## 2020-04-27 LAB — URINE CULTURE
MICRO NUMBER:: 11760252
Result:: NO GROWTH
SPECIMEN QUALITY:: ADEQUATE

## 2020-05-03 ENCOUNTER — Telehealth: Payer: Self-pay

## 2020-05-03 ENCOUNTER — Ambulatory Visit (INDEPENDENT_AMBULATORY_CARE_PROVIDER_SITE_OTHER): Payer: Medicare Other

## 2020-05-03 VITALS — BP 121/64 | HR 84 | Ht 70.0 in | Wt 200.0 lb

## 2020-05-03 DIAGNOSIS — Z0000 Encounter for general adult medical examination without abnormal findings: Secondary | ICD-10-CM | POA: Diagnosis not present

## 2020-05-03 DIAGNOSIS — E1159 Type 2 diabetes mellitus with other circulatory complications: Secondary | ICD-10-CM

## 2020-05-03 NOTE — Telephone Encounter (Signed)
Duplicate appointment canceled.  Fasting lab scheduled for tomorrow.  Patient aware.

## 2020-05-03 NOTE — Progress Notes (Signed)
Subjective:   Dakota Gonzalez is a 75 y.o. male who presents for Medicare Annual/Subsequent preventive examination.  Review of Systems    No ROS.  Medicare Wellness Virtual Visit.     Cardiac Risk Factors include: advanced age (>52mn, >>39women);male gender;diabetes mellitus;hypertension     Objective:    Today's Vitals   05/03/20 0911  BP: 121/64  Pulse: 84  Weight: 200 lb (90.7 kg)  Height: '5\' 10"'  (1.778 m)   Body mass index is 28.7 kg/m.  Advanced Directives 05/03/2020 04/30/2019 04/29/2018 04/26/2017 04/25/2016 11/28/2014  Does Patient Have a Medical Advance Directive? Yes Yes Yes Yes No No  Type of AParamedicof [place]Living will Living will [place] will - -  Does patient want to make changes to medical advance directive? No - Patient declined No - Patient declined No - Patient declined No - Patient declined - -  Copy of [place] Chart? No - copy requested No - copy requested - No - copy requested - -  Would patient like information on creating a medical advance directive? - - - - No - Patient declined -    Current Medications (verified) Outpatient Encounter Medications as of 05/03/2020  Medication Sig  . aspirin 81 MG tablet Take 81 mg by mouth daily.  .Marland Kitchenatorvastatin (LIPITOR) 40 MG tablet TAKE 1 TABLET(40 MG) BY MOUTH DAILY  . blood glucose meter kit and supplies One touch ultra, test once daily, E11.9  . Coenzyme Q10 (COQ-10) 100 MG CAPS Take 1 tablet by mouth daily.  . cyclobenzaprine (FLEXERIL) 10 MG tablet Take 1 tablet (10 mg total) by mouth every 8 (eight) hours as needed for muscle spasms (do not drive while using, may make you drowsy).  .Marland Kitchendocusate sodium (COLACE) 100 MG capsule Take 100 mg by mouth daily as needed for mild constipation.  . empagliflozin (JARDIANCE) 25 MG TABS tablet Take 1 tablet (25 mg total) by mouth daily.  . finasteride (PROSCAR) 5 MG  tablet TAKE 1 TABLET(5 MG) BY MOUTH DAILY  . glucose blood (ONE TOUCH ULTRA TEST) test strip TEST TWICE A DAILY AS DIRECTED  . Lancets (ONETOUCH DELICA PLUS [id number] MISC USE TO CHECK BLOOD SUGAR TWICE DAILY  . Lancets Misc. MISC ONETOUCH DELICA LANCETS FINE MISC check blood sugar twice daily  . lisinopril (ZESTRIL) 20 MG tablet TAKE 1 TABLET(20 MG) BY MOUTH DAILY  . metFORMIN (GLUCOPHAGE) 500 MG tablet TAKE 2 TABLETS(1000 MG) BY MOUTH TWICE DAILY WITH A MEAL  . Multiple Vitamins-Minerals (CENTRUM SILVER PO) Take 1 tablet by mouth daily.  . tamsulosin (FLOMAX) 0.4 MG CAPS capsule TAKE 1 CAPSULE(0.4 MG) BY MOUTH DAILY   No facility-administered encounter medications on file as of 05/03/2020.    Allergies (verified) Aspartame   History: Past Medical History:  Diagnosis Date  . Allergy   . Arthritis   . Asthma   . Depression   . Diabetes mellitus 2007  . Enlarged prostate   . GERD (gastroesophageal reflux disease)   . Headache(784.0)   . Hyperlipidemia   . Hypertension   . Migraine   . Sinusitis    Past Surgical History:  Procedure Laterality Date  . BACK SURGERY  10/2003   [facility] . HEM[facility]IOMA EXCISION  2014   right upper arm, Dr. SRonalee Belts . PROSTATE BIOPSY  2014   Dr. [place]lyn Larsen . [place]  Family History  Problem Relation Age of Onset  . Stroke Mother   . Kidney disease Mother   . Diabetes Mother   . Cancer Father   . Stroke Father   . Kidney disease Father   . Diabetes Sister   . Cancer Other        colon, breast   Social History   Socioeconomic History  . Marital status: Widowed    Spouse name: Not on file  . Number of children: Not on file  . Years of education: Not on file  . Highest education level: Not on file  Occupational History  . Not on file  Tobacco Use  . Smoking status: Former Research scientist (life sciences)  . Smokeless tobacco: Never Used  Vaping Use  . Vaping Use: Never used  Substance and Sexual  Activity  . Alcohol use: No  . Drug use: No  . Sexual activity: Never  Other Topics Concern  . Not on file  Social History Narrative   Lives in [place]. 1 son lives in [place].      Work - Disabled, previously Museum/gallery conservator and railroad.      Diet - regular   Exercise - ellipitical machine at home   Social Determinants of Health   Financial Resource Strain: Medium Risk  . Difficulty of Paying Living Expenses: Somewhat hard  Food Insecurity: No Food Insecurity  . Worried About Charity fundraiser in the Last Year: Never true  . Ran Out of Food in the Last Year: Never true  Transportation Needs: No Transportation Needs  . Lack of Transportation (Medical): No  . Lack of Transportation (Non-Medical): No  Physical Activity: Sufficiently Active  . Days of Exercise per Week: 3 days  . Minutes of Exercise per Session: 50 min  Stress: No Stress Concern Present  . Feeling of Stress : Not at all  Social Connections: Unknown  . Frequency of Communication with Friends and Family: More than three times a week  . Frequency of Social Gatherings with Friends and Family: More than three times a week  . Attends Religious Services: Not on file  . Active Member of Clubs or Organizations: Not on file  . Attends Archivist Meetings: Not on file  . Marital Status: Not on file    Tobacco Counseling Counseling given: Not Answered   Clinical Intake:  Pre-visit preparation completed: Yes    Nutrition Risk Assessment: Has the patient had any N/V/D within the last 2 months?  No  Does the patient have any non-healing wounds?  No  Has the patient had any unintentional weight loss or weight gain?  No   Diabetes Management: Is the patient seen by Chronic Care Management for management of their diabetes?  Yes   Diabetic Exams: Foot exam due. Notes no changes. Followed by pcp. Next appointment noted.    Diabetes: Yes (Followed by pcp)  How often do you need to have  someone help you when you read instructions, pamphlets, or other written materials from your doctor or pharmacy?: 1 - Never    Interpreter Needed?: No      Activities of Daily Living In your present state of health, do you have any difficulty performing the following activities: 05/03/2020  Hearing? N  Vision? N  Difficulty concentrating or making decisions? N  Walking or climbing stairs? N  Dressing or bathing? N  Doing errands, shopping? N  Preparing Food and eating ? N  Using the Toilet? N  In the  past six months, have you accidently leaked urine? N  Do you have problems with loss of bowel control? N  Managing your Medications? N  Managing your Finances? N  Housekeeping or managing your Housekeeping? N  Some recent data might be hidden    Patient Care Team: [name], MD as PCP - General (Family Medicine) Wellington Hampshire, MD as Consulting Physician (Cardiology) De Hollingshead, RPH-CPP as Pharmacist (Pharmacist)  Indicate any recent Medical Services you may have received from other than [facility] providers in the past year (date may be approximate).     Assessment:   This is a routine wellness examination for Kadin.  I connected with Davius today by telephone and verified that I am speaking with the correct person using two identifiers. Location patient: home Location provider: work Persons participating in the virtual visit: patient, Marine scientist.    I discussed the limitations, risks, security and privacy concerns of performing an evaluation and management service by telephone and the availability of in person appointments. The patient expressed understanding and verbally consented to this telephonic visit.    Interactive audio and video telecommunications were attempted between this provider and patient, however failed, due to patient having technical difficulties OR patient did not have access to video capability.  We continued and completed visit with audio  only.  Some vital signs may be absent or patient reported.   Hearing/Vision screen  Hearing Screening   '125Hz'  '250Hz'  '500Hz'  '1000Hz'  '2000Hz'  '3000Hz'  '4000Hz'  '6000Hz'  '8000Hz'   Right ear:           Left ear:           Comments: Patient is able to hear conversational tones without difficulty.  No issues reported.  Vision Screening Comments: Followed by [facility] Wears corrective lenses when reading  Annual visits  Virtual visit  Dietary issues and exercise activities discussed: Current Exercise Habits: Home exercise routine, Type of exercise: treadmill;walking, Intensity: Mild  Reduced salt/Low carb Good water intake  Goals      Patient Stated   .  DIET - REDUCE SUGAR INTAKE (pt-stated)      Low carb diet    .  Medication Management (pt-stated)      Patient Goals/Self-Care Activities . Over the next 30 days, patient will:  - take medications as prescribed collaborate with provider on medication access solutions..       Depression Screen PHQ 2/9 Scores 05/03/2020 04/26/2020 07/14/2019 04/30/2019 04/13/2019 01/13/2019 11/26/2018  PHQ - 2 Score 0 0 0 0 0 0 0    Fall Risk Fall Risk  05/03/2020 04/26/2020 01/14/2020 07/14/2019 04/30/2019  Falls in the past year? 0 0 1 0 0  Number falls in past yr: 0 0 0 0 -  Injury with Fall? 0 - 0 - -  Follow up Falls evaluation completed Falls evaluation completed Falls evaluation completed Falls evaluation completed Falls evaluation completed    Gonzales: Handrails in use when climbing stairs? Yes Home free of loose throw rugs in walkways, pet beds, electrical cords, etc? Yes  Adequate lighting in your home to reduce risk of falls? Yes   ASSISTIVE DEVICES UTILIZED TO PREVENT FALLS: Use of a cane, walker or w/c? No   TIMED UP AND GO: Was the test performed? No . Virtual visit.   Cognitive Function: Patient is alert and oriented x3.  Denies difficulty focusing, making decisions, memory loss.  Enjoys  reading, crossword puzzles and other brain stimulating activity.  MMSE/6CIT deferred. Normal by direct communication/observation.    MMSE - Mini Mental State Exam 04/25/2016  Orientation to time 5  Orientation to Place 5  Registration 3  Attention/ Calculation 5  Recall 3  Language- name 2 objects 2  Language- repeat 1  Language- follow 3 step command 3  Language- read & follow direction 1  Write a sentence 1  Copy design 1  Total score 30     6CIT Screen 04/30/2019 04/29/2018 04/26/2017  What Year? 0 points 0 points 0 points  What month? 0 points 0 points 0 points  What time? 0 points 0 points 0 points  Count back from 20 - 0 points 0 points  Months in reverse - 0 points 0 points  Repeat phrase - 0 points 0 points  Total Score - 0 0    Immunizations Immunization History  Administered Date(s) Administered  . Fluad Quad(high Dose 65+) 09/26/2018  . Influenza Split 10/17/2011  . Influenza, High Dose Seasonal PF 09/19/2016, 10/03/2017  . Influenza,inj,Quad PF,6+ Mos 09/22/2012, 10/10/2013, 11/10/2014  . Influenza-Unspecified 11/03/2017, 10/20/2019  . Moderna SARS-COV2 Booster Vaccination 10/16/2019  . Moderna Sars-Covid-2 Vaccination 02/27/2019, 03/27/2019  . Pneumococcal Conjugate-13 06/12/2013  . Pneumococcal Polysaccharide-23 10/17/2011  . Tdap 03/20/2017    Health Maintenance Health Maintenance  Topic Date Due  . FOOT EXAM  04/12/2020  . HEMOGLOBIN A1C  07/14/2020  . INFLUENZA VACCINE  08/15/2020  . OPHTHALMOLOGY EXAM  11/11/2020  . Fecal DNA (Cologuard)  04/18/2021  . TETANUS/TDAP  03/28/2027  . COVID-19 Vaccine  Completed  . Hepatitis C Screening  Completed  . PNA vac Low Risk Adult  Completed  . HPV VACCINES  Aged Out   Colorectal cancer screening: Type of screening: Cologuard. Completed 04/19/18. Repeat every 3 years  Lung Cancer Screening: (Low Dose CT Chest recommended if Age 68-80 years, 30 pack-year currently smoking OR have quit w/in 15years.) does not  qualify.    Dental Screening: Recommended annual dental exams for proper oral hygiene.  Community Resource Referral / Chronic Care Management: CRR required this visit?  No   CCM required this visit?  No      Plan:   Keep all routine maintenance appointments.   Follow up 05/13/20 @ 1:00  CCM 07/04/20 @ 9:00  Follow up 07/15/20/@ 9:30  I have personally reviewed and noted the following in the patient's chart:   . Medical and social history . Use of alcohol, tobacco or illicit drugs  . Current medications and supplements . Functional ability and status . Nutritional status . Physical activity . Advanced directives . List of other physicians . Hospitalizations, surgeries, and ER visits in previous 12 months . Vitals . Screenings to include cognitive, depression, and falls . Referrals and appointments  In addition, I have reviewed and discussed with patient certain preventive protocols, quality metrics, and best practice recommendations. A written personalized care plan for preventive services as well as general preventive health recommendations were provided to patient via mychart.     Varney Biles, LPN   09/18/8331

## 2020-05-03 NOTE — Telephone Encounter (Signed)
I think that appointment was scheduled by accident when he was scheduled to see me for his urine issues. We can cancel it. Order placed for an A1c. Please get him scheduled for the lab.

## 2020-05-03 NOTE — Telephone Encounter (Signed)
Patient would like a callback regarding upcoming appointment scheduled on 4/29. States he does not understand what he is following up on. Last visit 4/12 with urinary issues resolved.  If possible, he would also like the appointment to be scheduled in the morning to have A1c checked as well.

## 2020-05-03 NOTE — Patient Instructions (Addendum)
Dakota Gonzalez , Thank you for taking time to come for your Medicare Wellness Visit. I appreciate your ongoing commitment to your health goals. Please review the following plan we discussed and let me know if I can assist you in the future.   These are the goals we discussed: Goals      Patient Stated   .  DIET - REDUCE SUGAR INTAKE (pt-stated)      Low carb diet    .  Medication Management (pt-stated)      Patient Goals/Self-Care Activities . Over the next 30 days, patient will:  - take medications as prescribed collaborate with provider on medication access solutions..        This is a list of the screening recommended for you and due dates:  Health Maintenance  Topic Date Due  . Complete foot exam   04/12/2020  . Hemoglobin A1C  07/14/2020  . Flu Shot  08/15/2020  . Eye exam for diabetics  11/11/2020  . Cologuard (Stool DNA test)  04/18/2021  . Tetanus Vaccine  03/28/2027  . COVID-19 Vaccine  Completed  .  Hepatitis C: One time screening is recommended by Center for Disease Control  (CDC) for  adults born from [birth year] through 1964.   Completed  . Pneumonia vaccines  Completed  . HPV Vaccine  Aged Out    Immunizations Immunization History  Administered Date(s) Administered  . Fluad Quad(high Dose 65+) 09/26/2018  . Influenza Split 10/17/2011  . Influenza, High Dose Seasonal PF 09/19/2016, 10/03/2017  . Influenza,inj,Quad PF,6+ Mos 09/22/2012, 10/10/2013, 11/10/2014  . Influenza-Unspecified 11/03/2017, 10/20/2019  . Moderna SARS-COV2 Booster Vaccination 10/16/2019  . Moderna Sars-Covid-2 Vaccination 02/27/2019, 03/27/2019  . Pneumococcal Conjugate-13 06/12/2013  . Pneumococcal Polysaccharide-23 10/17/2011  . Tdap 03/20/2017  Keep all routine maintenance appointments.   Follow up 05/13/20 @ 1:00  CCM 07/04/20 @ 9:00  Follow up 07/15/20/@ 9:30  Advanced directives: End of life planning; Advance aging; Advanced directives discussed.  Copy of current HCPOA/Living Will  requested.    Conditions/risks identified: none new  Follow up in one year for your annual wellness visit.   Preventive Care 60 Years and Older, Male Preventive care refers to lifestyle choices and visits with your health care provider that can promote health and wellness. What does preventive care include?  A yearly physical exam. This is also called an annual well check.  Dental exams once or twice a year.  Routine eye exams. Ask your health care provider how often you should have your eyes checked.  Personal lifestyle choices, including:  Daily care of your teeth and gums.  Regular physical activity.  Eating a healthy diet.  Avoiding tobacco and drug use.  Limiting alcohol use.  Practicing safe sex.  Taking low doses of aspirin every day.  Taking vitamin and mineral supplements as recommended by your health care provider. What happens during an annual well check? The services and screenings done by your health care provider during your annual well check will depend on your age, overall health, lifestyle risk factors, and family history of disease. Counseling  Your health care provider may ask you questions about your:  Alcohol use.  Tobacco use.  Drug use.  Emotional well-being.  Home and relationship well-being.  Sexual activity.  Eating habits.  History of falls.  Memory and ability to understand (cognition).  Work and work Statistician. Screening  You may have the following tests or measurements:  Height, weight, and BMI.  Blood pressure.  Lipid and  cholesterol levels. These may be checked every 5 years, or more frequently if you are over 34 years old.  Skin check.  Lung cancer screening. You may have this screening every year starting at age 76 if you have a 30-pack-year history of smoking and currently smoke or have quit within the past 15 years.  Fecal occult blood test (FOBT) of the stool. You may have this test every year starting at age  27.  Flexible sigmoidoscopy or colonoscopy. You may have a sigmoidoscopy every 5 years or a colonoscopy every 10 years starting at age 12.  Prostate cancer screening. Recommendations will vary depending on your family history and other risks.  Hepatitis C blood test.  Hepatitis B blood test.  Sexually transmitted disease (STD) testing.  Diabetes screening. This is done by checking your blood sugar (glucose) after you have not eaten for a while (fasting). You may have this done every 1-3 years.  Abdominal aortic aneurysm (AAA) screening. You may need this if you are a current or former smoker.  Osteoporosis. You may be screened starting at age 61 if you are at high risk. Talk with your health care provider about your test results, treatment options, and if necessary, the need for more tests. Vaccines  Your health care provider may recommend certain vaccines, such as:  Influenza vaccine. This is recommended every year.  Tetanus, diphtheria, and acellular pertussis (Tdap, Td) vaccine. You may need a Td booster every 10 years.  Zoster vaccine. You may need this after age 50.  Pneumococcal 13-valent conjugate (PCV13) vaccine. One dose is recommended after age 81.  Pneumococcal polysaccharide (PPSV23) vaccine. One dose is recommended after age 24. Talk to your health care provider about which screenings and vaccines you need and how often you need them. This information is not intended to replace advice given to you by your health care provider. Make sure you discuss any questions you have with your health care provider. Document Released: 01/28/2015 Document Revised: 09/21/2015 Document Reviewed: 11/02/2014 Elsevier Interactive Patient Education  2017 [place] Prevention in the Home Falls can cause injuries. They can happen to people of all ages. There are many things you can do to make your home safe and to help prevent falls. What can I do on the outside of my  home?  Regularly fix the edges of walkways and driveways and fix any cracks.  Remove anything that might make you trip as you walk through a door, such as a raised step or threshold.  Trim any bushes or trees on the path to your home.  Use bright outdoor lighting.  Clear any walking paths of anything that might make someone trip, such as rocks or tools.  Regularly check to see if handrails are loose or broken. Make sure that both sides of any steps have handrails.  Any raised decks and porches should have guardrails on the edges.  Have any leaves, snow, or ice cleared regularly.  Use sand or salt on walking paths during winter.  Clean up any spills in your garage right away. This includes oil or grease spills. What can I do in the bathroom?  Use night lights.  Install grab bars by the toilet and in the tub and shower. Do not use towel bars as grab bars.  Use non-skid mats or decals in the tub or shower.  If you need to sit down in the shower, use a plastic, non-slip stool.  Keep the floor dry. Clean up any  water that spills on the floor as soon as it happens.  Remove soap buildup in the tub or shower regularly.  Attach bath mats securely with double-sided non-slip rug tape.  Do not have throw rugs and other things on the floor that can make you trip. What can I do in the bedroom?  Use night lights.  Make sure that you have a light by your bed that is easy to reach.  Do not use any sheets or blankets that are too big for your bed. They should not hang down onto the floor.  Have a firm chair that has side arms. You can use this for support while you get dressed.  Do not have throw rugs and other things on the floor that can make you trip. What can I do in the kitchen?  Clean up any spills right away.  Avoid walking on wet floors.  Keep items that you use a lot in easy-to-reach places.  If you need to reach something above you, use a strong step stool that has a  grab bar.  Keep electrical cords out of the way.  Do not use floor polish or wax that makes floors slippery. If you must use wax, use non-skid floor wax.  Do not have throw rugs and other things on the floor that can make you trip. What can I do with my stairs?  Do not leave any items on the stairs.  Make sure that there are handrails on both sides of the stairs and use them. Fix handrails that are broken or loose. Make sure that handrails are as long as the stairways.  Check any carpeting to make sure that it is firmly attached to the stairs. Fix any carpet that is loose or worn.  Avoid having throw rugs at the top or bottom of the stairs. If you do have throw rugs, attach them to the floor with carpet tape.  Make sure that you have a light switch at the top of the stairs and the bottom of the stairs. If you do not have them, ask someone to add them for you. What else can I do to help prevent falls?  Wear shoes that:  Do not have high heels.  Have rubber bottoms.  Are comfortable and fit you well.  Are closed at the toe. Do not wear sandals.  If you use a stepladder:  Make sure that it is fully opened. Do not climb a closed stepladder.  Make sure that both sides of the stepladder are locked into place.  Ask someone to hold it for you, if possible.  Clearly mark and make sure that you can see:  Any grab bars or handrails.  First and last steps.  Where the edge of each step is.  Use tools that help you move around (mobility aids) if they are needed. These include:  Canes.  Walkers.  Scooters.  Crutches.  Turn on the lights when you go into a dark area. Replace any light bulbs as soon as they burn out.  Set up your furniture so you have a clear path. Avoid moving your furniture around.  If any of your floors are uneven, fix them.  If there are any pets around you, be aware of where they are.  Review your medicines with your doctor. Some medicines can make  you feel dizzy. This can increase your chance of falling. Ask your doctor what other things that you can do to help prevent falls. This information  is not intended to replace advice given to you by your health care provider. Make sure you discuss any questions you have with your health care provider. Document Released: 10/28/2008 Document Revised: 06/09/2015 Document Reviewed: 02/05/2014 Elsevier Interactive Patient Education  2017 [employer].

## 2020-05-04 ENCOUNTER — Other Ambulatory Visit: Payer: Self-pay

## 2020-05-04 ENCOUNTER — Other Ambulatory Visit (INDEPENDENT_AMBULATORY_CARE_PROVIDER_SITE_OTHER): Payer: Medicare Other

## 2020-05-04 ENCOUNTER — Telehealth: Payer: Self-pay | Admitting: *Deleted

## 2020-05-04 DIAGNOSIS — E1159 Type 2 diabetes mellitus with other circulatory complications: Secondary | ICD-10-CM

## 2020-05-04 DIAGNOSIS — E785 Hyperlipidemia, unspecified: Secondary | ICD-10-CM

## 2020-05-04 LAB — HEPATIC FUNCTION PANEL
ALT: 24 U/L (ref 0–53)
AST: 19 U/L (ref 0–37)
Albumin: 4.4 g/dL (ref 3.5–5.2)
Alkaline Phosphatase: 49 U/L (ref 39–117)
Bilirubin, Direct: 0.2 mg/dL (ref 0.0–0.3)
Total Bilirubin: 1.1 mg/dL (ref 0.2–1.2)
Total Protein: 6.9 g/dL (ref 6.0–8.3)

## 2020-05-04 LAB — LIPID PANEL
Cholesterol: 113 mg/dL (ref 0–200)
HDL: 46.4 mg/dL (ref >39.00)
LDL Cholesterol: 39 mg/dL (ref 0–99)
NonHDL: 66.45
Total CHOL/HDL Ratio: 2
Triglycerides: 136 mg/dL (ref 0.0–149.0)
VLDL: 27.2 mg/dL (ref 0.0–40.0)

## 2020-05-04 LAB — HEMOGLOBIN A1C: Hgb A1c MFr Bld: 7.4 % — ABNORMAL HIGH (ref 4.6–6.5)

## 2020-05-04 NOTE — Addendum Note (Signed)
Addended by: Leeanne Rio on: 05/04/2020 10:11 AM   Modules accepted: Orders

## 2020-05-04 NOTE — Telephone Encounter (Signed)
Thank you for calling the extra tube.  I just placed an order for a hepatic function panel and lipid panel.  Thanks.

## 2020-05-04 NOTE — Telephone Encounter (Signed)
Extra tube was drawn in case other labs are needed. Only a A1C was ordered at time of draw. Lab note stated "fasting labs"

## 2020-05-05 ENCOUNTER — Encounter: Payer: Self-pay | Admitting: Family Medicine

## 2020-05-09 ENCOUNTER — Telehealth: Payer: Self-pay

## 2020-05-09 NOTE — Chronic Care Management (AMB) (Signed)
  Care Management   Note  05/09/2020 Name: [name] MRN: [id number] DOB: January 24, 1945  Dakota Gonzalez is a 75 y.o. year old male who is a primary care patient of [name], MD and is actively engaged with the care management team. I reached out to Laurell Roof by phone today to assist with re-scheduling a follow up visit with the Pharmacist  Follow up plan: Unsuccessful telephone outreach attempt made. A HIPAA compliant phone message was left for the patient providing contact information and requesting a return call.  The care management team will reach out to the patient again over the next 5 days.  If patient returns call to provider office, please advise to call [facility]  at [place], [place], [place], [place] [address] Direct Dial: [phone] [email or web address] Website: [email or web address]

## 2020-05-13 ENCOUNTER — Ambulatory Visit: Payer: Medicare Other | Admitting: Family Medicine

## 2020-05-13 NOTE — Chronic Care Management (AMB) (Signed)
  Care Management   Note  05/13/2020 Name: Dakota Gonzalez MRN: [id number] DOB: [birth year]  Dakota Gonzalez is a 75 y.o. year old male who is a primary care patient of Caryl Bis, Angela Adam, MD and is actively engaged with the care management team. I reached out to Laurell Roof by phone today to assist with re-scheduling a follow up visit with the Pharmacist  Follow up plan: Telephone appointment with care management team member scheduled for:06/01/2020  Noreene Larsson, [place], [facility]  [place], [place] [address] Direct Dial: [phone] [email or web address]: [email or web address]

## 2020-06-01 ENCOUNTER — Ambulatory Visit (INDEPENDENT_AMBULATORY_CARE_PROVIDER_SITE_OTHER): Payer: Medicare Other | Admitting: Pharmacist

## 2020-06-01 DIAGNOSIS — E785 Hyperlipidemia, unspecified: Secondary | ICD-10-CM | POA: Diagnosis not present

## 2020-06-01 DIAGNOSIS — E1159 Type 2 diabetes mellitus with other circulatory complications: Secondary | ICD-10-CM | POA: Diagnosis not present

## 2020-06-01 DIAGNOSIS — I10 Essential (primary) hypertension: Secondary | ICD-10-CM | POA: Diagnosis not present

## 2020-06-01 NOTE — Patient Instructions (Signed)
Mr. Kilker,   Continue metformin 1000 mg twice daily, Jardiance 25 mg daily. Add Rybelsus 3 mg daily. Take this first thing in the morning on an empty stomach, wait 30 minutes before other medications or food. This medication may cause some stomach upset when first starting. Please let me know if any persistent or troublesome symptoms, like vomiting, severe diarrhea or constipation, or persistent nausea.   Take care!  Catie Darnelle Maffucci, PharmD [phone]   Visit Information  PATIENT GOALS: Goals Addressed              This Visit's Progress     Patient Stated   .  Medication Management (pt-stated)        Patient Goals/Self-Care Activities . Over the next 90 days, patient will - take medications as prescribed - check glucose twice daily  collaborate with provider on medication access solutions..        Print copy of patient instructions, educational materials, and care plan provided in person.  Plan: Telephone follow up appointment with care management team member scheduled for:  ~ 4 weeks  Catie Darnelle Maffucci, PharmD, [place], [place] Clinical Pharmacist [employer] at [employer] [phone]

## 2020-06-01 NOTE — Chronic Care Management (AMB) (Signed)
Chronic Care Management Pharmacy Note  06/01/2020 Name:  Dakota Gonzalez MRN:  [id number] DOB:  04-Mar-1945  Subjective: BRITTNEY CARAWAY is an 75 y.o. year old male who is a primary patient of Caryl Bis, Angela Adam, MD.  The CCM team was consulted for assistance with disease management and care coordination needs.    Engaged with patient by telephone for follow up visit in response to provider referral for pharmacy case management and/or care coordination services.   Consent to Services:  The patient was given information about Chronic Care Management services, agreed to services, and gave verbal consent prior to initiation of services.  Please see initial visit note for detailed documentation.   Patient Care Team: [name], MD as PCP - General (Family Medicine) Wellington Hampshire, MD as Consulting Physician (Cardiology) De Hollingshead, [place] as Pharmacist (Pharmacist)  Recent office visits: 4/12 - PCP visit, A1c remaining elevated  Recent consult visits: None since our last call  Hospital visits: None in previous 6 months  Objective:  Lab Results  Component Value Date   CREATININE 0.94 02/09/2020   CREATININE 1.11 01/14/2020   CREATININE 0.95 04/13/2019    Lab Results  Component Value Date   HGBA1C 7.4 (H) 05/04/2020   Last diabetic Eye exam:  Lab Results  Component Value Date/Time   HMDIABEYEEXA No Retinopathy 11/12/2019 12:00 AM    Last diabetic Foot exam:  Lab Results  Component Value Date/Time   HMDIABFOOTEX Normal 12/14/2013 12:00 AM        Component Value Date/Time   CHOL 113 05/04/2020 1011   TRIG 136.0 05/04/2020 1011   HDL 46.40 05/04/2020 1011   CHOLHDL 2 05/04/2020 1011   VLDL 27.2 05/04/2020 1011   LDLCALC 39 05/04/2020 1011   LDLDIRECT 70.0 08/29/2016 0940    Hepatic Function Latest Ref Rng & Units 05/04/2020 04/13/2019 04/09/2018  Total Protein 6.0 - 8.3 g/dL 6.9 7.5 7.8  Albumin 3.5 - 5.2 g/dL 4.4 4.8 4.9  AST 0 - 37 U/L  '19 24 20  ' ALT 0 - 53 U/L '24 26 27  ' Alk Phosphatase 39 - 117 U/L 49 51 50  Total Bilirubin 0.2 - 1.2 mg/dL 1.1 1.1 1.3(H)  Bilirubin, Direct 0.0 - 0.3 mg/dL 0.2 - -    CBC Latest Ref Rng & Units 11/28/2014 11/09/2014 12/14/2013  WBC 3.8 - 10.6 K/uL 9.5 8.4 7.8  Hemoglobin 13.0 - 18.0 g/dL 15.0 15.1 14.7  Hematocrit 40.0 - 52.0 % 45.9 45.4 44.6  Platelets 150 - 440 K/uL 159 167.0 167.0   Clinical ASCVD: No    Social History   Tobacco Use  Smoking Status Former Smoker  Smokeless Tobacco Never Used   BP Readings from Last 3 Encounters:  05/03/20 121/64  04/26/20 (!) 160/70  01/14/20 128/74   Pulse Readings from Last 3 Encounters:  05/03/20 84  04/26/20 97  01/14/20 93   Wt Readings from Last 3 Encounters:  05/03/20 200 lb (90.7 kg)  04/26/20 200 lb 3.2 oz (90.8 kg)  01/14/20 205 lb (93 kg)    Assessment: Review of patient past medical history, allergies, medications, health status, including review of consultants reports, laboratory and other test data, was performed as part of comprehensive evaluation and provision of chronic care management services.   SDOH:  (Social Determinants of Health) assessments and interventions performed:  SDOH Interventions   Flowsheet Row Most Recent Value  SDOH Interventions   Financial Strain Interventions Other (Comment)  [manufacturer assistance]  CCM Care Plan  Allergies  Allergen Reactions  . Aspartame Other (See Comments)    Medications Reviewed Today    Reviewed by De Hollingshead, RPH-CPP (Pharmacist) on 06/01/20 at 1039  Med List Status: <None>  Medication Order Taking? Sig Documenting Provider Last Dose Status Informant  aspirin 81 MG tablet [id number] Yes Take 81 mg by mouth daily. [provider] Taking Active   atorvastatin (LIPITOR) 40 MG tablet [id number] Yes TAKE 1 TABLET(40 MG) BY MOUTH DAILY [name], MD Taking Active   blood glucose meter kit and supplies [id number] Yes One touch ultra,  test once daily, E11.9 [name], MD Taking Active   Coenzyme Q10 (COQ-10) 100 MG CAPS [id number] Yes Take 1 tablet by mouth daily. [provider] Taking Active   cyclobenzaprine (FLEXERIL) 10 MG tablet [id number] Yes Take 1 tablet (10 mg total) by mouth every 8 (eight) hours as needed for muscle spasms (do not drive while using, may make you drowsy). Jackolyn Confer, MD Taking Active            Med Note [facility], IVY A   Mon Apr 25, 2020  9:50 AM) Uses 1-2x/month  docusate sodium (COLACE) 100 MG capsule [id number] Yes Take 100 mg by mouth daily as needed for mild constipation. [provider] Taking Active Self           Med Note ([facility], IVY A   Mon Apr 25, 2020  9:50 AM) Taking QHS  empagliflozin (JARDIANCE) 25 MG TABS tablet [id number] Yes Take 1 tablet (25 mg total) by mouth daily. [name], MD Taking Active   finasteride (PROSCAR) 5 MG tablet [id number] Yes TAKE 1 TABLET(5 MG) BY MOUTH DAILY [name], MD Taking Active   glucose blood (ONE TOUCH ULTRA TEST) test strip [id number] Yes TEST TWICE A DAILY AS DIRECTED [name], MD Taking Active   Lancets (ONETOUCH DELICA PLUS SMOLMB86L) [place] [id number] Yes USE TO CHECK BLOOD SUGAR TWICE DAILY [provider] Taking Active   Lancets Misc. [place] [id number] Yes ONETOUCH DELICA LANCETS FINE MISC check blood sugar twice daily [name], MD Taking Active   lisinopril (ZESTRIL) 20 MG tablet [id number] Yes TAKE 1 TABLET(20 MG) BY MOUTH DAILY [name], MD Taking Active   metFORMIN (GLUCOPHAGE) 500 MG tablet [id number] Yes TAKE 2 TABLETS(1000 MG) BY MOUTH TWICE DAILY WITH A MEAL [name], MD Taking Active   Multiple Vitamins-Minerals (CENTRUM SILVER PO) [id number] Yes Take 1 tablet by mouth daily. [provider] Taking Active   tamsulosin (FLOMAX) 0.4 MG CAPS capsule [id number] Yes TAKE 1 CAPSULE(0.4 MG) BY MOUTH DAILY [name], MD Taking Active            Patient Active Problem List   Diagnosis Date Noted  . Urine frequency 04/26/2020  . Adrenal adenoma, left 10/09/2017  . BPH (benign prostatic hyperplasia) 06/20/2017  . Onychomycosis 03/18/2017  . Right knee pain 11/18/2015  . Coronary atherosclerosis of native coronary artery 11/09/2014  . Obesity (BMI 30-39.9) 03/02/2013  . HTN (hypertension) 02/26/2012  . Incomplete emptying of bladder 02/11/2012  . Nodular prostate with urinary obstruction 02/11/2012  . Restless legs 01/21/2012  . Diabetes mellitus type 2, controlled ([place]) 07/09/2011  . Hyperlipidemia 07/09/2011  . Elevated PSA 07/09/2011  . Chronic back pain 07/09/2011    Immunization History  Administered Date(s) Administered  . Fluad Quad(high Dose 65+) 09/26/2018  . Influenza Split 10/17/2011  . Influenza, High Dose Seasonal  PF 09/19/2016, 10/03/2017  . Influenza,inj,Quad PF,6+ Mos 09/22/2012, 10/10/2013, 11/10/2014  . Influenza-Unspecified 11/03/2017, 10/20/2019  . Moderna SARS-COV2 Booster Vaccination 10/16/2019  . Moderna Sars-Covid-2 Vaccination 02/27/2019, 03/27/2019  . Pneumococcal Conjugate-13 06/12/2013  . Pneumococcal Polysaccharide-23 10/17/2011  . Tdap 03/20/2017    Conditions to be addressed/monitored: HTN, HLD and DMII  Care Plan : Medication Management  Updates made by De Hollingshead, RPH-CPP since 06/01/2020 12:00 AM    Problem: Diabetes, Hypertension     Long-Range Goal: Disease Progression Prevention   Start Date: 06/01/2020  This Visit's Progress: On track  Recent Progress: On track  Priority: High  Note:   Current Barriers:  . Unable to achieve control of diabetes   Pharmacist Clinical Goal(s):  Marland Kitchen Over the next 30 days, patient will verbalize ability to afford treatment regimen through collaboration with PharmD and provider.  . Over the next 90 days, patient will maintain control of diabetes as evidenced by A1c through collaboration with PharmD and  provider.  Interventions: . 1:1 collaboration with [name], MD regarding development and update of comprehensive plan of care as evidenced by provider attestation and co-signature . Inter-disciplinary care team collaboration (see longitudinal plan of care) . Comprehensive medication review performed; medication list updated in electronic medical record  Diabetes: . Uncontrolled; current treatment: metformin 1000 mg BID, Jardiance 25 mg daily o Enrolled in [employer] for Jardiance patient assistance . Current glucose readings: fasting: not checking lately post prandial glucose: none >200; some 130-150s, but some 160-190s . Denies hypoglycemic events/episodes . Current exercise: ~5x/weekly (15-20 mins) - treadmill, fast walking for 1 mile, weight training, and [employer] occasionally; reports his glucose readings are better controlled days that he exercises.  . Extensive discussion regarding GLP1. Patient prefers tablet. No hx MTC, gallbladder disease, pancreatitis.  . Start Rybelsus 3 mg daily. Counseled on mechanism of action, side effects, benefits. Take first thing in the morning on an empty stomach, wait 30 minutes before other medications or meals.  . Will collaborate w/ provider, CPhT to pursue patient assistance for Rybelsus.  . Continue metformin 1000 mg BID, Jardiance 25 mg daily.   Hypertension: . Controlled per patient report; current treatment: lisinopril 20 mg qAM  . Current home readings: 100-110s/60s . Denies dizziness, lightheadedness.  . Recommended to continue current regimen  Hyperlipidemia and ASCVD prevention with hx of CAD . Controlled  per last lipid panel(goal<70); current treatment: atorvastatin 40 mg daily . Current antiplatelet regimen: aspirin 81 mg daily . Recommended to continue current treatment regimen  BPH: . Controlled per patient symptom report; current treatment: finasteride 5 mg daily, tamsulosin 0.4 mg daily . Recommend to continue  current regimen at this time  Patient Goals/Self-Care Activities . Over the next 90 days, patient will:  - take medications as prescribed collaborate with provider on medication access solutions  Follow Up Plan: Telephone follow up appointment with care management team member scheduled for: ~10 weeks      Medication Assistance: Jardiance obtained through [place] medication assistance program.  Enrollment ends 01/14/21. Rybelsus application through [employer] in progress.   Patient's preferred pharmacy is:  [id number] [address], [place] AT [facility] [address] [place] [address] Phone: [phone] Fax: [phone]    Follow Up:  Patient agrees to Care Plan and Follow-up.  Plan: Telephone follow up appointment with care management team member scheduled for:  ~ 4 weeks  Catie Darnelle Maffucci, PharmD, [place], [place] Clinical Pharmacist [employer] at [place]

## 2020-06-14 ENCOUNTER — Telehealth: Payer: Self-pay | Admitting: Pharmacy Technician

## 2020-06-14 DIAGNOSIS — Z596 Low income: Secondary | ICD-10-CM

## 2020-06-14 NOTE — Progress Notes (Signed)
[facility])                                            [place] Team    06/14/2020  Dakota Gonzalez 08/27/1945 [id number]                                      Medication Assistance Referral  Referral From: [facility] Embedded RPh Catie T.   Medication/Company: Rybelsus / [employer] Patient application portion:  N/A Embedded PharmD had signed while in clinic Provider application portion:  N/A Embedded PharmD had signed while in clinic to Dr. [name] Provider address/fax verified via: Office website   Received both patient and provider portion(s) of patient assistance application(s) for Rybelsus. Faxed completed application and required documents into [employer].   Emerson Barretto P. Arkel Cartwright, [place]  [phone]

## 2020-06-24 ENCOUNTER — Other Ambulatory Visit: Payer: Self-pay

## 2020-06-24 ENCOUNTER — Other Ambulatory Visit: Payer: Self-pay | Admitting: Family Medicine

## 2020-06-24 ENCOUNTER — Encounter: Payer: Self-pay | Admitting: Family Medicine

## 2020-06-24 MED ORDER — ONETOUCH ULTRA 2 W/DEVICE KIT: 1.0000 | PACK | Freq: Every day | 0 refills | Status: DC | PRN

## 2020-06-28 ENCOUNTER — Ambulatory Visit (INDEPENDENT_AMBULATORY_CARE_PROVIDER_SITE_OTHER): Payer: Medicare Other | Admitting: Pharmacist

## 2020-06-28 DIAGNOSIS — I2510 Atherosclerotic heart disease of native coronary artery without angina pectoris: Secondary | ICD-10-CM | POA: Diagnosis not present

## 2020-06-28 DIAGNOSIS — E1165 Type 2 diabetes mellitus with hyperglycemia: Secondary | ICD-10-CM | POA: Diagnosis not present

## 2020-06-28 DIAGNOSIS — E785 Hyperlipidemia, unspecified: Secondary | ICD-10-CM

## 2020-06-28 DIAGNOSIS — I10 Essential (primary) hypertension: Secondary | ICD-10-CM | POA: Diagnosis not present

## 2020-06-28 DIAGNOSIS — E1159 Type 2 diabetes mellitus with other circulatory complications: Secondary | ICD-10-CM | POA: Diagnosis not present

## 2020-06-28 MED ORDER — RYBELSUS 3 MG PO TABS: 6.0000 mg | ORAL_TABLET | Freq: Every day | ORAL | 0 refills | Status: AC

## 2020-06-28 NOTE — Chronic Care Management (AMB) (Signed)
Chronic Care Management Pharmacy Note  06/28/2020 Name:  MUHAMMAD VACCA MRN:  [id number] DOB:  1945-11-11   Subjective: Dakota Gonzalez is an 75 y.o. year old male who is a primary patient of Caryl Bis, Angela Adam, MD.  The CCM team was consulted for assistance with disease management and care coordination needs.    Engaged with patient by telephone for follow up visit in response to provider referral for pharmacy case management and/or care coordination services.   Consent to Services:  The patient was given information about Chronic Care Management services, agreed to services, and gave verbal consent prior to initiation of services.  Please see initial visit note for detailed documentation.   Patient Care Team: [name], MD as PCP - General (Family Medicine) Wellington Hampshire, MD as Consulting Physician (Cardiology) De Hollingshead, RPH-CPP as Pharmacist (Pharmacist)   Objective:  Lab Results  Component Value Date   CREATININE 0.94 02/09/2020   CREATININE 1.11 01/14/2020   CREATININE 0.95 04/13/2019    Lab Results  Component Value Date   HGBA1C 7.4 (H) 05/04/2020   Last diabetic Eye exam:  Lab Results  Component Value Date/Time   HMDIABEYEEXA No Retinopathy 11/12/2019 12:00 AM    Last diabetic Foot exam:  Lab Results  Component Value Date/Time   HMDIABFOOTEX Normal 12/14/2013 12:00 AM        Component Value Date/Time   CHOL 113 05/04/2020 1011   TRIG 136.0 05/04/2020 1011   HDL 46.40 05/04/2020 1011   CHOLHDL 2 05/04/2020 1011   VLDL 27.2 05/04/2020 1011   LDLCALC 39 05/04/2020 1011   LDLDIRECT 70.0 08/29/2016 0940    Hepatic Function Latest Ref Rng & Units 05/04/2020 04/13/2019 04/09/2018  Total Protein 6.0 - 8.3 g/dL 6.9 7.5 7.8  Albumin 3.5 - 5.2 g/dL 4.4 4.8 4.9  AST 0 - 37 U/L _0 ALT 0 - 53 U/L _1 Alk Phosphatase 39 - 117 U/L 49 51 50  Total Bilirubin 0.2 - 1.2 mg/dL 1.1 1.1 1.3(H)  Bilirubin, Direct 0.0 - 0.3 mg/dL  0.2 - -    No results found for: TSH, FREET4  CBC Latest Ref Rng & Units 11/28/2014 11/09/2014 12/14/2013  WBC 3.8 - 10.6 K/uL 9.5 8.4 7.8  Hemoglobin 13.0 - 18.0 g/dL 15.0 15.1 14.7  Hematocrit 40.0 - 52.0 % 45.9 45.4 44.6  Platelets 150 - 440 K/uL 159 167.0 167.0    Clinical ASCVD: Yes - CAD   Social History   Tobacco Use  Smoking Status Former   Pack years: 0.00  Smokeless Tobacco Never   BP Readings from Last 3 Encounters:  05/03/20 121/64  04/26/20 (!) 160/70  01/14/20 128/74   Pulse Readings from Last 3 Encounters:  05/03/20 84  04/26/20 97  01/14/20 93   Wt Readings from Last 3 Encounters:  05/03/20 200 lb (90.7 kg)  04/26/20 200 lb 3.2 oz (90.8 kg)  01/14/20 205 lb (93 kg)    Assessment: Review of patient past medical history, allergies, medications, health status, including review of consultants reports, laboratory and other test data, was performed as part of comprehensive evaluation and provision of chronic care management services.   SDOH:  (Social Determinants of Health) assessments and interventions performed:  SDOH Interventions    Flowsheet Row Most Recent Value  SDOH Interventions   Financial Strain Interventions Other (Comment)  [manufacturer assistance]       CCM Care Plan  Allergies  Allergen Reactions  Aspartame Other (See Comments)    Medications Reviewed Today     Reviewed by De Hollingshead, RPH-CPP (Pharmacist) on 06/28/20 at 1049  Med List Status: <None>   Medication Order Taking? Sig Documenting Provider Last Dose Status Informant  aspirin 81 MG tablet [id number] Yes Take 81 mg by mouth daily. [provider] Taking Active   atorvastatin (LIPITOR) 40 MG tablet [id number] Yes TAKE 1 TABLET(40 MG) BY MOUTH DAILY [name], MD Taking Active   blood glucose meter kit and supplies [id number]  One touch ultra, test once daily, E11.9 [name], MD  Active   Blood Glucose Monitoring Suppl (ONE TOUCH ULTRA  2) w/Device KIT [id number]  1 Device by Does not apply route daily as needed. [name], MD  Active   Coenzyme Q10 (COQ-10) 100 MG CAPS [id number] Yes Take 1 tablet by mouth daily. [provider] Taking Active   cyclobenzaprine (FLEXERIL) 10 MG tablet [id number] Yes Take 1 tablet (10 mg total) by mouth every 8 (eight) hours as needed for muscle spasms (do not drive while using, may make you drowsy). Jackolyn Confer, MD Taking Active            Med Note [facility], IVY A   Mon Apr 25, 2020  9:50 AM) Uses 1-2x/month  docusate sodium (COLACE) 100 MG capsule [id number] Yes Take 100 mg by mouth daily as needed for mild constipation. [provider] Taking Active Self           Med Note ([facility], IVY A   Mon Apr 25, 2020  9:50 AM) Taking QHS  empagliflozin (JARDIANCE) 25 MG TABS tablet [id number] Yes Take 1 tablet (25 mg total) by mouth daily. [name], MD Taking Active   finasteride (PROSCAR) 5 MG tablet [id number] Yes TAKE 1 TABLET(5 MG) BY MOUTH DAILY [name], MD Taking Active   glucose blood (ONE TOUCH ULTRA TEST) test strip [id number] Yes TEST TWICE A DAILY AS DIRECTED [name], MD Taking Active   Lancets (ONETOUCH DELICA PLUS [id number]) [place] [id number] Yes USE TO CHECK BLOOD SUGAR TWICE DAILY [provider] Taking Active   Lancets Misc. [place] [id number] Yes ONETOUCH DELICA LANCETS FINE MISC check blood sugar twice daily [name], MD Taking Active   lisinopril (ZESTRIL) 20 MG tablet [id number] Yes TAKE 1 TABLET(20 MG) BY MOUTH DAILY [name], MD Taking Active   metFORMIN (GLUCOPHAGE) 500 MG tablet [id number] Yes TAKE 2 TABLETS(1000 MG) BY MOUTH TWICE DAILY WITH A MEAL [name], MD Taking Active   Multiple Vitamins-Minerals (CENTRUM SILVER PO) [id number] Yes Take 1 tablet by mouth daily. [provider] Taking Active   tamsulosin (FLOMAX) 0.4 MG CAPS capsule [id number] Yes TAKE 1 CAPSULE(0.4 MG) BY MOUTH DAILY  [name], MD Taking Active             Patient Active Problem List   Diagnosis Date Noted   Urine frequency 04/26/2020   Adrenal adenoma, left 10/09/2017   BPH (benign prostatic hyperplasia) 06/20/2017   Onychomycosis 03/18/2017   Right knee pain 11/18/2015   Coronary atherosclerosis of native coronary artery 11/09/2014   Obesity (BMI 30-39.9) 03/02/2013   HTN (hypertension) 02/26/2012   Incomplete emptying of bladder 02/11/2012   Nodular prostate with urinary obstruction 02/11/2012   Restless legs 01/21/2012   Diabetes mellitus type 2, controlled ([place]) 07/09/2011   Hyperlipidemia 07/09/2011   Elevated PSA 07/09/2011   Chronic back pain 07/09/2011  Immunization History  Administered Date(s) Administered   Fluad Quad(high Dose 65+) 09/26/2018   Influenza Split 10/17/2011   Influenza, High Dose Seasonal PF 09/19/2016, 10/03/2017   Influenza,inj,Quad PF,6+ Mos 09/22/2012, 10/10/2013, 11/10/2014   Influenza-Unspecified 11/03/2017, 10/20/2019   Moderna SARS-COV2 Booster Vaccination 10/16/2019   Moderna Sars-Covid-2 Vaccination 02/27/2019, 03/27/2019   Pneumococcal Conjugate-13 06/12/2013   Pneumococcal Polysaccharide-23 10/17/2011   Tdap 03/20/2017    Conditions to be addressed/monitored: CAD, HTN, HLD, and DMII  Care Plan : Medication Management  Updates made by De Hollingshead, RPH-CPP since 06/28/2020 12:00 AM     Problem: Diabetes, Hypertension      Long-Range Goal: Disease Progression Prevention   Start Date: 06/01/2020  This Visit's Progress: On track  Recent Progress: On track  Priority: High  Note:   Current Barriers:  Unable to achieve control of diabetes   Pharmacist Clinical Goal(s):  Over the next 30 days, patient will verbalize ability to afford treatment regimen through collaboration with PharmD and provider.  Over the next 90 days, patient will maintain control of diabetes as evidenced by A1c through collaboration with PharmD and  provider.  Interventions: 1:1 collaboration with [name], MD regarding development and update of comprehensive plan of care as evidenced by provider attestation and co-signature Inter-disciplinary care team collaboration (see longitudinal plan of care) Comprehensive medication review performed; medication list updated in electronic medical   SDOH: Due for Shingrix. Discuss moving forward.  Due for DM foot exam. PCP visit next month.  Diabetes: Uncontrolled; current treatment: metformin 1000 mg BID, Jardiance 25 mg daily, Rybelsus 3 mg daily Denies any GI upset since starting Rybelsus, though denies any benefit. Confirms appropriate administration. Enrolled in [place] for Jardiance patient assistance Current glucose readings: fasting: 160-180s; post prandial: 180-200s Denies hypoglycemic events/episodes Current exercise: ~5x/weekly (15-20 mins) - treadmill, fast walking for 1 mile, weight training, and [employer] occasionally; reports his glucose readings are better controlled days that he exercises.  Increase Rybelsus. Will have patient take 2 of the 3 mg sample tablet strength until supply of 7 mg tablet strength arrives from [employer] PAP.   Hypertension: Controlled per patient report; current treatment: lisinopril 20 mg qAM  Current home readings: 100-110s/60s; this morning 100/57 Denies dizziness, lightheadedness upon standing.  Recommended to continue current regimen  but to notify us if any development of hypotensive symptoms.   Hyperlipidemia and ASCVD prevention with hx of CAD Controlled  per last lipid panel(goal<70); current treatment: atorvastatin 40 mg daily Fill history up to date  Current antiplatelet regimen: aspirin 81 mg daily Recommended to continue current treatment regimen  BPH: Controlled per patient symptom report; current treatment: finasteride 5 mg daily, tamsulosin 0.4 mg daily Previously recommend to continue current regimen at this  time  Patient Goals/Self-Care Activities Over the next 90 days, patient will:  - take medications as prescribed collaborate with provider on medication access solutions  Follow Up Plan: Telephone follow up appointment with care management team member scheduled for: ~8 weeks      Medication Assistance: Jardiance obtained through [place] medication assistance program.  Enrollment ends 01/14/21. Rybelsus application through [employer] in progress.   Patient's preferred pharmacy is:  [facility] #[id number] [address], [place] AT [facility] OF [address] [place] [address] Phone: [phone] Fax: [phone]    Follow Up:  Patient agrees to Care Plan and Follow-up.  Plan: Telephone follow up appointment  with care management team member scheduled for:  8 weeks  Catie Darnelle Maffucci, PharmD, [place], [place] Clinical Pharmacist [employer] at [place]

## 2020-06-28 NOTE — Patient Instructions (Signed)
Visit Information  PATIENT GOALS:  Goals Addressed               This Visit's Progress     Patient Stated     Medication Management (pt-stated)        Patient Goals/Self-Care Activities Over the next 90 days, patient will - take medications as prescribed - check glucose twice daily  collaborate with provider on medication access solutions..         Patient verbalizes understanding of instructions provided today and agrees to view in [place].   Plan: Telephone follow up appointment with care management team member scheduled for:  8 weeks  Catie Darnelle Maffucci, PharmD, [place], [place] Clinical Pharmacist [employer] at [employer] [phone]

## 2020-06-29 ENCOUNTER — Telehealth: Payer: Self-pay | Admitting: Pharmacy Technician

## 2020-06-29 NOTE — Progress Notes (Signed)
Dakota Gonzalez [facility])                                            [place] Team    06/29/2020  Dakota Gonzalez Jan 04, 1946 [id number]   Care coordination call placed to [place] in regards to Rybelsus application.  Spoke to [place] who informed patient was APPROVED 06/22/20-01/14/21. She informed 60 tabs of Rybelsus 7mg  would be delivered to the provider's office in the next 10-15 business days. She informed the order number is [id number].  Kasumi Ditullio P. Vester [place], [place]  [phone]

## 2020-07-02 ENCOUNTER — Encounter: Payer: Self-pay | Admitting: Family Medicine

## 2020-07-04 ENCOUNTER — Encounter: Payer: Self-pay | Admitting: Family Medicine

## 2020-07-04 ENCOUNTER — Telehealth: Payer: Medicare Other

## 2020-07-05 ENCOUNTER — Telehealth: Payer: Self-pay

## 2020-07-05 ENCOUNTER — Ambulatory Visit: Payer: Medicare Other | Admitting: Pharmacist

## 2020-07-05 DIAGNOSIS — I10 Essential (primary) hypertension: Secondary | ICD-10-CM

## 2020-07-05 DIAGNOSIS — I2510 Atherosclerotic heart disease of native coronary artery without angina pectoris: Secondary | ICD-10-CM

## 2020-07-05 DIAGNOSIS — R21 Rash and other nonspecific skin eruption: Secondary | ICD-10-CM | POA: Diagnosis not present

## 2020-07-05 DIAGNOSIS — E785 Hyperlipidemia, unspecified: Secondary | ICD-10-CM

## 2020-07-05 DIAGNOSIS — L255 Unspecified contact dermatitis due to plants, except food: Secondary | ICD-10-CM | POA: Diagnosis not present

## 2020-07-05 DIAGNOSIS — E1159 Type 2 diabetes mellitus with other circulatory complications: Secondary | ICD-10-CM

## 2020-07-05 NOTE — Patient Instructions (Signed)
Visit Information  PATIENT GOALS:  Goals Addressed               This Visit's Progress     Patient Stated     Medication Management (pt-stated)        Patient Goals/Self-Care Activities Over the next 90 days, patient will - take medications as prescribed - check glucose twice daily  collaborate with provider on medication access solutions..          Patient verbalizes understanding of instructions provided today and agrees to view in [place].   Plan: Telephone follow up appointment with care management team member scheduled for:  ~ 4 weeks as previously scheduled  Catie Darnelle Maffucci, PharmD, [place], [place] Clinical Pharmacist [employer] at [employer] [phone]

## 2020-07-05 NOTE — Chronic Care Management (AMB) (Signed)
Chronic Care Management Pharmacy Note  07/05/2020 Name:  Dakota Gonzalez MRN:  [id number] DOB:  May 14, 1945  Subjective: Dakota Gonzalez is an 75 y.o. year old male who is a primary patient of Caryl Bis, Angela Adam, MD.  The CCM team was consulted for assistance with disease management and care coordination needs.    Engaged with patient by telephone for  response to medication management question  in response to provider referral for pharmacy case management and/or care coordination services.   Consent to Services:  The patient was given information about Chronic Care Management services, agreed to services, and gave verbal consent prior to initiation of services.  Please see initial visit note for detailed documentation.   Patient Care Team: [name], MD as PCP - General (Family Medicine) Wellington Hampshire, MD as Consulting Physician (Cardiology) De Hollingshead, RPH-CPP as Pharmacist (Pharmacist)   Objective:  Lab Results  Component Value Date   CREATININE 0.94 02/09/2020   CREATININE 1.11 01/14/2020   CREATININE 0.95 04/13/2019    Lab Results  Component Value Date   HGBA1C 7.4 (H) 05/04/2020   Last diabetic Eye exam:  Lab Results  Component Value Date/Time   HMDIABEYEEXA No Retinopathy 11/12/2019 12:00 AM    Last diabetic Foot exam:  Lab Results  Component Value Date/Time   HMDIABFOOTEX Normal 12/14/2013 12:00 AM        Component Value Date/Time   CHOL 113 05/04/2020 1011   TRIG 136.0 05/04/2020 1011   HDL 46.40 05/04/2020 1011   CHOLHDL 2 05/04/2020 1011   VLDL 27.2 05/04/2020 1011   LDLCALC 39 05/04/2020 1011   LDLDIRECT 70.0 08/29/2016 0940    Hepatic Function Latest Ref Rng & Units 05/04/2020 04/13/2019 04/09/2018  Total Protein 6.0 - 8.3 g/dL 6.9 7.5 7.8  Albumin 3.5 - 5.2 g/dL 4.4 4.8 4.9  AST 0 - 37 U/L _0 ALT 0 - 53 U/L _1 Alk Phosphatase 39 - 117 U/L 49 51 50  Total Bilirubin 0.2 - 1.2 mg/dL 1.1 1.1 1.3(H)   Bilirubin, Direct 0.0 - 0.3 mg/dL 0.2 - -    No results found for: TSH, FREET4  CBC Latest Ref Rng & Units 11/28/2014 11/09/2014 12/14/2013  WBC 3.8 - 10.6 K/uL 9.5 8.4 7.8  Hemoglobin 13.0 - 18.0 g/dL 15.0 15.1 14.7  Hematocrit 40.0 - 52.0 % 45.9 45.4 44.6  Platelets 150 - 440 K/uL 159 167.0 167.0      Clinical ASCVD: No     Social History   Tobacco Use  Smoking Status Former   Pack years: 0.00  Smokeless Tobacco Never   BP Readings from Last 3 Encounters:  05/03/20 121/64  04/26/20 (!) 160/70  01/14/20 128/74   Pulse Readings from Last 3 Encounters:  05/03/20 84  04/26/20 97  01/14/20 93   Wt Readings from Last 3 Encounters:  05/03/20 200 lb (90.7 kg)  04/26/20 200 lb 3.2 oz (90.8 kg)  01/14/20 205 lb (93 kg)    Assessment: Review of patient past medical history, allergies, medications, health status, including review of consultants reports, laboratory and other test data, was performed as part of comprehensive evaluation and provision of chronic care management services.   SDOH:  (Social Determinants of Health) assessments and interventions performed:  SDOH Interventions    Flowsheet Row Most Recent Value  SDOH Interventions   Financial Strain Interventions Other (Comment)  [medication [place]  Allergies  Allergen Reactions   Aspartame Other (See Comments)    Medications Reviewed Today     Reviewed by De Hollingshead, RPH-CPP (Pharmacist) on 06/28/20 at 1049  Med List Status: <None>   Medication Order Taking? Sig Documenting Provider Last Dose Status Informant  aspirin 81 MG tablet [id number] Yes Take 81 mg by mouth daily. [provider] Taking Active   atorvastatin (LIPITOR) 40 MG tablet [id number] Yes TAKE 1 TABLET(40 MG) BY MOUTH DAILY [name], MD Taking Active   blood glucose meter kit and supplies [id number]  One touch ultra, test once daily, E11.9 [name], MD  Active   Blood Glucose  Monitoring Suppl (ONE TOUCH ULTRA 2) w/Device KIT [id number]  1 Device by Does not apply route daily as needed. [name], MD  Active   Coenzyme Q10 (COQ-10) 100 MG CAPS [id number] Yes Take 1 tablet by mouth daily. [provider] Taking Active   cyclobenzaprine (FLEXERIL) 10 MG tablet [id number] Yes Take 1 tablet (10 mg total) by mouth every 8 (eight) hours as needed for muscle spasms (do not drive while using, may make you drowsy). Jackolyn Confer, MD Taking Active            Med Note [facility], IVY A   Mon Apr 25, 2020  9:50 AM) Uses 1-2x/month  docusate sodium (COLACE) 100 MG capsule [id number] Yes Take 100 mg by mouth daily as needed for mild constipation. [provider] Taking Active Self           Med Note ([facility], IVY A   Mon Apr 25, 2020  9:50 AM) Taking QHS  empagliflozin (JARDIANCE) 25 MG TABS tablet [id number] Yes Take 1 tablet (25 mg total) by mouth daily. [name], MD Taking Active   finasteride (PROSCAR) 5 MG tablet [id number] Yes TAKE 1 TABLET(5 MG) BY MOUTH DAILY [name], MD Taking Active   glucose blood (ONE TOUCH ULTRA TEST) test strip [id number] Yes TEST TWICE A DAILY AS DIRECTED [name], MD Taking Active   Lancets (ONETOUCH DELICA PLUS [id number]) [place] [id number] Yes USE TO CHECK BLOOD SUGAR TWICE DAILY [provider] Taking Active   Lancets Misc. [place] [id number] Yes ONETOUCH DELICA LANCETS FINE MISC check blood sugar twice daily [name], MD Taking Active   lisinopril (ZESTRIL) 20 MG tablet [id number] Yes TAKE 1 TABLET(20 MG) BY MOUTH DAILY [name], MD Taking Active   metFORMIN (GLUCOPHAGE) 500 MG tablet [id number] Yes TAKE 2 TABLETS(1000 MG) BY MOUTH TWICE DAILY WITH A MEAL [name], MD Taking Active   Multiple Vitamins-Minerals (CENTRUM SILVER PO) [id number] Yes Take 1 tablet by mouth daily. [provider] Taking Active   tamsulosin (FLOMAX) 0.4 MG CAPS capsule [id number] Yes TAKE 1  CAPSULE(0.4 MG) BY MOUTH DAILY [name], MD Taking Active             Patient Active Problem List   Diagnosis Date Noted   Urine frequency 04/26/2020   Adrenal adenoma, left 10/09/2017   BPH (benign prostatic hyperplasia) 06/20/2017   Onychomycosis 03/18/2017   Right knee pain 11/18/2015   Coronary atherosclerosis of native coronary artery 11/09/2014   Obesity (BMI 30-39.9) 03/02/2013   HTN (hypertension) 02/26/2012   Incomplete emptying of bladder 02/11/2012   Nodular prostate with urinary obstruction 02/11/2012   Restless legs 01/21/2012   Diabetes mellitus type 2, controlled ([place]) 07/09/2011   Hyperlipidemia 07/09/2011   Elevated PSA 07/09/2011   Chronic  back pain 07/09/2011    Immunization History  Administered Date(s) Administered   Fluad Quad(high Dose 65+) 09/26/2018   Influenza Split 10/17/2011   Influenza, High Dose Seasonal PF 09/19/2016, 10/03/2017   Influenza,inj,Quad PF,6+ Mos 09/22/2012, 10/10/2013, 11/10/2014   Influenza-Unspecified 11/03/2017, 10/20/2019   Moderna SARS-COV2 Booster Vaccination 10/16/2019   Moderna Sars-Covid-2 Vaccination 02/27/2019, 03/27/2019   Pneumococcal Conjugate-13 06/12/2013   Pneumococcal Polysaccharide-23 10/17/2011   Tdap 03/20/2017    Conditions to be addressed/monitored: HTN, HLD, and DMII  Care Plan : Medication Management  Updates made by De Hollingshead, RPH-CPP since 07/05/2020 12:00 AM     Problem: Diabetes, Hypertension      Long-Range Goal: Disease Progression Prevention   Start Date: 06/01/2020  This Visit's Progress: On track  Recent Progress: On track  Priority: High  Note:   Current Barriers:  Unable to achieve control of diabetes   Pharmacist Clinical Goal(s):  Over the next 30 days, patient will verbalize ability to afford treatment regimen through collaboration with PharmD and provider.  Over the next 90 days, patient will maintain control of diabetes as evidenced by A1c through  collaboration with PharmD and provider.  Interventions: 1:1 collaboration with [name], MD regarding development and update of comprehensive plan of care as evidenced by provider attestation and co-signature Inter-disciplinary care team collaboration (see longitudinal plan of care) Comprehensive medication review performed; medication list updated in electronic medical   SDOH: Due for Shingrix. Discuss moving forward.  Due for DM foot exam. PCP visit next month.  Diabetes: Uncontrolled; current treatment: metformin 1000 mg BID, Jardiance 25 mg daily, Rybelsus 6 mg daily - taking two 3 mg tablets until Rybelsus 7 mg dose arrives.  Denies any GI upset since starting Rybelsus, though denies any benefit. Confirms appropriate administration. Enrolled in [place] for Jardiance patient assistance Approved with [employer] for [employer] that he does see some improvement in readings since increasing Rybelsus dose Reports an episode where he got a little shaky at church the other day. Ate a piece candy and symptoms improved.  Calls today to report the rash/itching that he has been corresponding with Dr. Caryl Bis about via [place]. Reports that he was pulling vines last Monday the 13th and developed rashes on exposed areas (legs, arms, feet - was wearing flip flops). Reports improvement over the past couple of days with OTC cream. He at first wondered if it was related to Rybelsus, but notes that he has continued to take Rybelsus and rash has improved. Denies any throat involvement or difficulty breathing.  Agree that likely related to botanical allergens. Continue current regimen at this time Discussed concepts of symptomatic hypoglycemia, even with "normal" blood sugars.   Hypertension: Controlled per patient report; current treatment: lisinopril 20 mg qAM  Previously recommended to continue current regimen  Hyperlipidemia and ASCVD prevention with hx of CAD Controlled  per  last lipid panel(goal<70); current treatment: atorvastatin 40 mg daily Current antiplatelet regimen: aspirin 81 mg daily Previously recommended to continue current treatment regimen  BPH: Controlled per patient symptom report; current treatment: finasteride 5 mg daily, tamsulosin 0.4 mg daily Previously recommend to continue current regimen at this time  Patient Goals/Self-Care Activities Over the next 90 days, patient will:  - take medications as prescribed collaborate with provider on medication access solutions  Follow Up Plan: Telephone follow up appointment with care management team member scheduled for: ~6 weeks as previously scheduled       Medication Assistance:  Rybelsus, Jardiance  obtained through [employer] and [employer] medication assistance program.  Enrollment ends 12/14/20 for Rybelsus, 01/14/21 for Jardiance  Patient's preferred pharmacy is:  [facility] #[id number] - [address], [place] AT [address] [place] [address] Phone: [phone] Fax: [phone]  Follow Up:  Patient agrees to Care Plan and Follow-up.  Plan: Telephone follow up appointment with care management team member scheduled for:  ~ 4 weeks as previously scheduled  Catie Darnelle Maffucci, PharmD, [place], [place] Clinical Pharmacist [employer] at [employer] [phone]

## 2020-07-05 NOTE — Telephone Encounter (Signed)
Pt would like a call back to discuss Semaglutide (RYBELSUS) 3 MG TABS

## 2020-07-05 NOTE — Telephone Encounter (Signed)
See other mychart message.

## 2020-07-05 NOTE — Telephone Encounter (Signed)
Returned call. See CCM documentation 

## 2020-07-06 ENCOUNTER — Encounter: Payer: Self-pay | Admitting: Family Medicine

## 2020-07-06 ENCOUNTER — Ambulatory Visit: Payer: Medicare Other | Admitting: Pharmacist

## 2020-07-06 DIAGNOSIS — I2510 Atherosclerotic heart disease of native coronary artery without angina pectoris: Secondary | ICD-10-CM

## 2020-07-06 DIAGNOSIS — E785 Hyperlipidemia, unspecified: Secondary | ICD-10-CM

## 2020-07-06 DIAGNOSIS — E1159 Type 2 diabetes mellitus with other circulatory complications: Secondary | ICD-10-CM

## 2020-07-06 DIAGNOSIS — I10 Essential (primary) hypertension: Secondary | ICD-10-CM

## 2020-07-06 NOTE — Patient Instructions (Signed)
Visit Information  PATIENT GOALS:  Goals Addressed               This Visit's Progress     Patient Stated     Medication Management (pt-stated)        Patient Goals/Self-Care Activities Over the next 90 days, patient will - take medications as prescribed - check glucose twice daily  collaborate with provider on medication access solutions.         Patient verbalizes understanding of instructions provided today and agrees to view in [place].    Plan: Telephone follow up appointment with care management team member scheduled for:  ~ 4 weeks as previously scheduled  Catie Darnelle Maffucci, PharmD, [place], [place] Clinical Pharmacist [employer] at [employer] [phone]

## 2020-07-06 NOTE — Chronic Care Management (AMB) (Signed)
Chronic Care Management Pharmacy Note  07/06/2020 Name:  Dakota Gonzalez MRN:  [id number] DOB:  04-01-1945  Subjective: Dakota Gonzalez is an 75 y.o. year old male who is a primary patient of Caryl Bis, Angela Adam, MD.  The CCM team was consulted for assistance with disease management and care coordination needs.    Engaged with patient by telephone for  medication management question  in response to provider referral for pharmacy case management and/or care coordination services.   Consent to Services:  The patient was given information about Chronic Care Management services, agreed to services, and gave verbal consent prior to initiation of services.  Please see initial visit note for detailed documentation.   Patient Care Team: [name], MD as PCP - General (Family Medicine) Wellington Hampshire, MD as Consulting Physician (Cardiology) De Hollingshead, RPH-CPP as Pharmacist (Pharmacist)   Objective:  Lab Results  Component Value Date   CREATININE 0.94 02/09/2020   CREATININE 1.11 01/14/2020   CREATININE 0.95 04/13/2019    Lab Results  Component Value Date   HGBA1C 7.4 (H) 05/04/2020   Last diabetic Eye exam:  Lab Results  Component Value Date/Time   HMDIABEYEEXA No Retinopathy 11/12/2019 12:00 AM    Last diabetic Foot exam:  Lab Results  Component Value Date/Time   HMDIABFOOTEX Normal 12/14/2013 12:00 AM        Component Value Date/Time   CHOL 113 05/04/2020 1011   TRIG 136.0 05/04/2020 1011   HDL 46.40 05/04/2020 1011   CHOLHDL 2 05/04/2020 1011   VLDL 27.2 05/04/2020 1011   LDLCALC 39 05/04/2020 1011   LDLDIRECT 70.0 08/29/2016 0940    Hepatic Function Latest Ref Rng & Units 05/04/2020 04/13/2019 04/09/2018  Total Protein 6.0 - 8.3 g/dL 6.9 7.5 7.8  Albumin 3.5 - 5.2 g/dL 4.4 4.8 4.9  AST 0 - 37 U/L _0 ALT 0 - 53 U/L _1 Alk Phosphatase 39 - 117 U/L 49 51 50  Total Bilirubin 0.2 - 1.2 mg/dL 1.1 1.1 1.3(H)  Bilirubin, Direct 0.0  - 0.3 mg/dL 0.2 - -    No results found for: TSH, FREET4  CBC Latest Ref Rng & Units 11/28/2014 11/09/2014 12/14/2013  WBC 3.8 - 10.6 K/uL 9.5 8.4 7.8  Hemoglobin 13.0 - 18.0 g/dL 15.0 15.1 14.7  Hematocrit 40.0 - 52.0 % 45.9 45.4 44.6  Platelets 150 - 440 K/uL 159 167.0 167.0    No results found for: VD25OH  Clinical ASCVD: No  The ASCVD Risk score Mikey Bussing DC Jr., et al., 2013) failed to calculate for the following reasons:   The valid total cholesterol range is 130 to 320 mg/dL      Social History   Tobacco Use  Smoking Status Former   Pack years: 0.00  Smokeless Tobacco Never   BP Readings from Last 3 Encounters:  05/03/20 121/64  04/26/20 (!) 160/70  01/14/20 128/74   Pulse Readings from Last 3 Encounters:  05/03/20 84  04/26/20 97  01/14/20 93   Wt Readings from Last 3 Encounters:  05/03/20 200 lb (90.7 kg)  04/26/20 200 lb 3.2 Dakota (90.8 kg)  01/14/20 205 lb (93 kg)    Assessment: Review of patient past medical history, allergies, medications, health status, including review of consultants reports, laboratory and other test data, was performed as part of comprehensive evaluation and provision of chronic care management services.   SDOH:  (Social Determinants of Health) assessments and interventions performed:  SDOH Interventions    Flowsheet Row Most Recent Value  SDOH Interventions   Financial Strain Interventions Other (Comment)  [medication access]       CCM Care Plan  Allergies  Allergen Reactions   Aspartame Other (See Comments)    Medications Reviewed Today     Reviewed by De Hollingshead, RPH-CPP (Pharmacist) on 06/28/20 at 1049  Med List Status: <None>   Medication Order Taking? Sig Documenting Provider Last Dose Status Informant  aspirin 81 MG tablet [id number] Yes Take 81 mg by mouth daily. [provider] Taking Active   atorvastatin (LIPITOR) 40 MG tablet [id number] Yes TAKE 1 TABLET(40 MG) BY MOUTH DAILY [name],  MD Taking Active   blood glucose meter kit and supplies [id number]  One touch ultra, test once daily, E11.9 [name], MD  Active   Blood Glucose Monitoring Suppl (ONE TOUCH ULTRA 2) w/Device KIT [id number]  1 Device by Does not apply route daily as needed. [name], MD  Active   Coenzyme Q10 (COQ-10) 100 MG CAPS [id number] Yes Take 1 tablet by mouth daily. [provider] Taking Active   cyclobenzaprine (FLEXERIL) 10 MG tablet [id number] Yes Take 1 tablet (10 mg total) by mouth every 8 (eight) hours as needed for muscle spasms (do not drive while using, may make you drowsy). Jackolyn Confer, MD Taking Active            Med Note [facility], IVY A   Mon Apr 25, 2020  9:50 AM) Uses 1-2x/month  docusate sodium (COLACE) 100 MG capsule [id number] Yes Take 100 mg by mouth daily as needed for mild constipation. [provider] Taking Active Self           Med Note ([facility], IVY A   Mon Apr 25, 2020  9:50 AM) Taking QHS  empagliflozin (JARDIANCE) 25 MG TABS tablet [id number] Yes Take 1 tablet (25 mg total) by mouth daily. [name], MD Taking Active   finasteride (PROSCAR) 5 MG tablet [id number] Yes TAKE 1 TABLET(5 MG) BY MOUTH DAILY [name], MD Taking Active   glucose blood (ONE TOUCH ULTRA TEST) test strip [id number] Yes TEST TWICE A DAILY AS DIRECTED [name], MD Taking Active   Lancets (ONETOUCH DELICA PLUS [id number]) [place] [id number] Yes USE TO CHECK BLOOD SUGAR TWICE DAILY [provider] Taking Active   Lancets Misc. [place] [id number] Yes ONETOUCH DELICA LANCETS FINE MISC check blood sugar twice daily [name], MD Taking Active   lisinopril (ZESTRIL) 20 MG tablet [id number] Yes TAKE 1 TABLET(20 MG) BY MOUTH DAILY [name], MD Taking Active   metFORMIN (GLUCOPHAGE) 500 MG tablet [id number] Yes TAKE 2 TABLETS(1000 MG) BY MOUTH TWICE DAILY WITH A MEAL [name], MD Taking Active   Multiple Vitamins-Minerals (CENTRUM  SILVER PO) [id number] Yes Take 1 tablet by mouth daily. [provider] Taking Active   tamsulosin (FLOMAX) 0.4 MG CAPS capsule [id number] Yes TAKE 1 CAPSULE(0.4 MG) BY MOUTH DAILY [name], MD Taking Active             Patient Active Problem List   Diagnosis Date Noted   Urine frequency 04/26/2020   Adrenal adenoma, left 10/09/2017   BPH (benign prostatic hyperplasia) 06/20/2017   Onychomycosis 03/18/2017   Right knee pain 11/18/2015   Coronary atherosclerosis of native coronary artery 11/09/2014   Obesity (BMI 30-39.9) 03/02/2013   HTN (hypertension) 02/26/2012   Incomplete emptying of bladder 02/11/2012  Nodular prostate with urinary obstruction 02/11/2012   Restless legs 01/21/2012   Diabetes mellitus type 2, controlled ([place]) 07/09/2011   Hyperlipidemia 07/09/2011   Elevated PSA 07/09/2011   Chronic back pain 07/09/2011    Immunization History  Administered Date(s) Administered   Fluad Quad(high Dose 65+) 09/26/2018   Influenza Split 10/17/2011   Influenza, High Dose Seasonal PF 09/19/2016, 10/03/2017   Influenza,inj,Quad PF,6+ Mos 09/22/2012, 10/10/2013, 11/10/2014   Influenza-Unspecified 11/03/2017, 10/20/2019   Moderna SARS-COV2 Booster Vaccination 10/16/2019   Moderna Sars-Covid-2 Vaccination 02/27/2019, 03/27/2019   Pneumococcal Conjugate-13 06/12/2013   Pneumococcal Polysaccharide-23 10/17/2011   Tdap 03/20/2017    Conditions to be addressed/monitored: HLD and DMII  Care Plan : Medication Management  Updates made by De Hollingshead, RPH-CPP since 07/06/2020 12:00 AM     Problem: Diabetes, Hypertension      Long-Range Goal: Disease Progression Prevention   Start Date: 06/01/2020  This Visit's Progress: On track  Recent Progress: On track  Priority: High  Note:   Current Barriers:  Unable to achieve control of diabetes   Pharmacist Clinical Goal(s):  Over the next 30 days, patient will verbalize ability to afford treatment  regimen through collaboration with PharmD and provider.  Over the next 90 days, patient will maintain control of diabetes as evidenced by A1c through collaboration with PharmD and provider.  Interventions: 1:1 collaboration with [name], MD regarding development and update of comprehensive plan of care as evidenced by provider attestation and co-signature Inter-disciplinary care team collaboration (see longitudinal plan of care) Comprehensive medication review performed; medication list updated in electronic medical   SDOH: Due for Shingrix. Discuss moving forward.  Due for DM foot exam. PCP visit next month.  Health Maintenance: Calls today to report that he went to urgent care and they confirmed it was poison ivy. Was given Medrol dose pack, he was calling to ask how to take it. Dose pack has instructions to take 60 mg (20 after breakfast, 10 after lunch, 10 after supper, 20 mg at bedtime) day 1, 50 mg day 2, etc. Discussed that I recommend he take the entire dose early in the day (but after a meal) to reduce risk of impact on sleep. Discussed that steroids may increase blood sugar and to not be concerned if elevations in ~200s. He can stop medication, hydrate, and contact us if he sees elevations in the 300s or higher. He verbalized understanding.   Diabetes: Uncontrolled; current treatment: metformin 1000 mg BID, Jardiance 25 mg daily, Rybelsus 6 mg daily - taking two 3 mg tablets until Rybelsus 7 mg dose arrives.  Denies any GI upset since starting Rybelsus, though denies any benefit. Confirms appropriate administration. Enrolled in [place] for Jardiance patient assistance Approved with [employer] for Rybelsus  Previously recommended to continue current regimen at this time   Hypertension: Controlled per patient report; current treatment: lisinopril 20 mg qAM  Previously recommended to continue current regimen  Hyperlipidemia and ASCVD prevention with hx of  CAD Controlled  per last lipid panel(goal<70); current treatment: atorvastatin 40 mg daily Current antiplatelet regimen: aspirin 81 mg daily Previously recommended to continue current treatment regimen  BPH: Controlled per patient symptom report; current treatment: finasteride 5 mg daily, tamsulosin 0.4 mg daily Previously recommend to continue current regimen at this time  Patient Goals/Self-Care Activities Over the next 90 days, patient will:  - take medications as prescribed collaborate with provider on medication access solutions  Follow Up Plan: Telephone follow up appointment with  care management team member scheduled for: ~6 weeks as previously scheduled       Medication Assistance: Rybelsus, Jardiance obtained through [employer] and [employer] medication assistance program.  Enrollment ends 12/14/20 for Rybelsus, 01/14/21 for Jardiance  Patient's preferred pharmacy is:  [id number] - [address], [address] AT [address] [place] [address] Phone: [phone] Fax: [phone]  Follow Up:  Patient agrees to Care Plan and Follow-up.  Plan: Telephone follow up appointment with care management team member scheduled for:  ~ 4 weeks as previously scheduled  Catie Darnelle Maffucci, PharmD, [place], [place] Clinical Pharmacist [employer] at [employer] [phone]

## 2020-07-07 ENCOUNTER — Encounter: Payer: Self-pay | Admitting: Family Medicine

## 2020-07-09 ENCOUNTER — Other Ambulatory Visit: Payer: Self-pay | Admitting: Family Medicine

## 2020-07-11 ENCOUNTER — Telehealth: Payer: Self-pay

## 2020-07-11 NOTE — Telephone Encounter (Signed)
Medication for pickup was put up front in cabinet, because I will be on vacation.  Makensie Mulhall,cma

## 2020-07-11 NOTE — Telephone Encounter (Signed)
I called and informed the patient that the medication Rybellus form patient assistance is ready for pickup, Patient stated he has an appointment on Friday and will get it then.  Maggie Senseney,cma

## 2020-07-15 ENCOUNTER — Ambulatory Visit (INDEPENDENT_AMBULATORY_CARE_PROVIDER_SITE_OTHER): Payer: Medicare Other | Admitting: Family Medicine

## 2020-07-15 ENCOUNTER — Encounter: Payer: Self-pay | Admitting: Family Medicine

## 2020-07-15 ENCOUNTER — Other Ambulatory Visit: Payer: Self-pay

## 2020-07-15 VITALS — BP 146/84 | HR 81 | Temp 97.8°F | Resp 16 | Ht 68.75 in | Wt 193.5 lb

## 2020-07-15 DIAGNOSIS — B351 Tinea unguium: Secondary | ICD-10-CM | POA: Diagnosis not present

## 2020-07-15 DIAGNOSIS — I10 Essential (primary) hypertension: Secondary | ICD-10-CM | POA: Diagnosis not present

## 2020-07-15 DIAGNOSIS — E1159 Type 2 diabetes mellitus with other circulatory complications: Secondary | ICD-10-CM

## 2020-07-15 NOTE — Patient Instructions (Signed)
Nice to see you. We will have you return for labs in 4 weeks.  We will follow-up in 4 months. Please continue with your current medications.

## 2020-07-15 NOTE — Assessment & Plan Note (Signed)
Very well controlled at home.  He will continue lisinopril 20 mg once daily.  He will monitor for lightheadedness.

## 2020-07-15 NOTE — Assessment & Plan Note (Signed)
Seems to be well controlled.  He will return for an A1c in about 4 weeks.  He will continue Jardiance 25 mg daily, metformin 1000 mg twice daily, and Rybelsus 6 mg daily.

## 2020-07-15 NOTE — Progress Notes (Signed)
Dakota Rumps, MD Phone: [phone]  Dakota Gonzalez is a 75 y.o. male who presents today for f/u.  DIABETES Disease Monitoring: Blood Sugar ranges-120-130 2 hr post prandial Polyuria/phagia/dipsia- no      Optho- UTD Medications: Compliance- taking jardiance, metformin, rybelsus Hypoglycemic symptoms- no  HYPERTENSION Disease Monitoring Home BP Monitoring 100-110/60-65 Chest pain- no    Dyspnea- no Medications Compliance-  taking lisinopril. Lightheadedness-  no  Edema- no   Social History   Tobacco Use  Smoking Status Former   Pack years: 0.00  Smokeless Tobacco Never    Current Outpatient Medications on File Prior to Visit  Medication Sig Dispense Refill   aspirin 81 MG tablet Take 81 mg by mouth daily.     atorvastatin (LIPITOR) 40 MG tablet TAKE 1 TABLET(40 MG) BY MOUTH DAILY 90 tablet 0   blood glucose meter kit and supplies One touch ultra, test once daily, E11.9 1 each 0   Blood Glucose Monitoring Suppl (ONE TOUCH ULTRA 2) w/Device KIT 1 Device by Does not apply route daily as needed. 1 kit 0   Coenzyme Q10 (COQ-10) 100 MG CAPS Take 1 tablet by mouth daily.     cyclobenzaprine (FLEXERIL) 10 MG tablet Take 1 tablet (10 mg total) by mouth every 8 (eight) hours as needed for muscle spasms (do not drive while using, may make you drowsy). 60 tablet 1   docusate sodium (COLACE) 100 MG capsule Take 100 mg by mouth daily as needed for mild constipation.     empagliflozin (JARDIANCE) 25 MG TABS tablet Take 1 tablet (25 mg total) by mouth daily. 90 tablet 3   finasteride (PROSCAR) 5 MG tablet TAKE 1 TABLET(5 MG) BY MOUTH DAILY 90 tablet 1   glucose blood (ONE TOUCH ULTRA TEST) test strip TEST TWICE A DAILY AS DIRECTED 200 each 5   Lancets (ONETOUCH DELICA PLUS [id number]) MISC USE TO CHECK BLOOD SUGAR TWICE DAILY     Lancets Misc. MISC ONETOUCH DELICA LANCETS FINE MISC check blood sugar twice daily 200 each 5   lisinopril (ZESTRIL) 20 MG tablet TAKE 1 TABLET(20 MG) BY  MOUTH DAILY 90 tablet 1   metFORMIN (GLUCOPHAGE) 500 MG tablet TAKE 2 TABLETS(1000 MG) BY MOUTH TWICE DAILY WITH A MEAL 360 tablet 3   Multiple Vitamins-Minerals (CENTRUM SILVER PO) Take 1 tablet by mouth daily.     Semaglutide (RYBELSUS) 3 MG TABS Take 6 mg by mouth daily. Until Rybelsus 7 mg supply arrives from patient assistance 60 tablet 0   tamsulosin (FLOMAX) 0.4 MG CAPS capsule TAKE 1 CAPSULE(0.4 MG) BY MOUTH DAILY 90 capsule 3   No current facility-administered medications on file prior to visit.     ROS see history of present illness  Objective  Physical Exam Vitals:   07/15/20 0930  BP: (!) 146/84  Pulse: 81  Resp: 16  Temp: 97.8 F (36.6 C)  SpO2: 96%    BP Readings from Last 3 Encounters:  07/15/20 (!) 146/84  05/03/20 121/64  04/26/20 (!) 160/70   Wt Readings from Last 3 Encounters:  07/15/20 193 lb 8 oz (87.8 kg)  05/03/20 200 lb (90.7 kg)  04/26/20 200 lb 3.2 oz (90.8 kg)    Physical Exam Constitutional:      General: He is not in acute distress.    Appearance: He is not diaphoretic.  Cardiovascular:     Rate and Rhythm: Normal rate and regular rhythm.     Heart sounds: Normal heart sounds.  Pulmonary:  Effort: Pulmonary effort is normal.     Breath sounds: Normal breath sounds.  Skin:    General: Skin is warm and dry.  Neurological:     Mental Status: He is alert.   Diabetic Foot Exam - Simple   Simple Foot Form Diabetic Foot exam was performed with the following findings: Yes 07/15/2020  9:50 AM  Visual Inspection No deformities, no ulcerations, no other skin breakdown bilaterally: Yes Sensation Testing Intact to touch and monofilament testing bilaterally: Yes Pulse Check Posterior Tibialis and Dorsalis pulse intact bilaterally: Yes Comments   Onychomycosis noted on several toenails   Assessment/Plan: Please see individual problem list.  Problem List Items Addressed This Visit     Diabetes mellitus type 2, controlled ([place])  (Chronic)    Seems to be well controlled.  He will return for an A1c in about 4 weeks.  He will continue Jardiance 25 mg daily, metformin 1000 mg twice daily, and Rybelsus 6 mg daily.       Relevant Orders   Hemoglobin A1c   HTN (hypertension) - Primary (Chronic)    Very well controlled at home.  He will continue lisinopril 20 mg once daily.  He will monitor for lightheadedness.       Relevant Orders   Basic Metabolic Panel (BMET)   Onychomycosis    Chronic issue.  He will monitor.         Return in about 4 weeks (around 08/12/2020) for Labs, at least 90 days after that for follow-up with PCP.  This visit occurred during the SARS-CoV-2 public health emergency.  Safety protocols were in place, including screening questions prior to the visit, additional usage of staff PPE, and extensive cleaning of exam room while observing appropriate contact time as indicated for disinfecting solutions.    Dakota Rumps, MD [place]

## 2020-07-15 NOTE — Assessment & Plan Note (Signed)
Chronic issue.  He will monitor.

## 2020-07-22 ENCOUNTER — Other Ambulatory Visit: Payer: Self-pay | Admitting: Family Medicine

## 2020-08-12 ENCOUNTER — Other Ambulatory Visit: Payer: Self-pay

## 2020-08-12 ENCOUNTER — Other Ambulatory Visit (INDEPENDENT_AMBULATORY_CARE_PROVIDER_SITE_OTHER): Payer: Medicare Other

## 2020-08-12 DIAGNOSIS — I10 Essential (primary) hypertension: Secondary | ICD-10-CM

## 2020-08-12 DIAGNOSIS — E1159 Type 2 diabetes mellitus with other circulatory complications: Secondary | ICD-10-CM

## 2020-08-12 LAB — BASIC METABOLIC PANEL
BUN: 15 mg/dL (ref 6–23)
CO2: 27 mEq/L (ref 19–32)
Calcium: 9.9 mg/dL (ref 8.4–10.5)
Chloride: 101 mEq/L (ref 96–112)
Creatinine, Ser: 0.98 mg/dL (ref 0.40–1.50)
GFR: 76.17 mL/min (ref >60.00)
Glucose, Bld: 120 mg/dL — ABNORMAL HIGH (ref 70–99)
Potassium: 4.2 mEq/L (ref 3.5–5.1)
Sodium: 137 mEq/L (ref 135–145)

## 2020-08-12 LAB — HEMOGLOBIN A1C: Hgb A1c MFr Bld: 7.1 % — ABNORMAL HIGH (ref 4.6–6.5)

## 2020-08-14 ENCOUNTER — Encounter: Payer: Self-pay | Admitting: Family Medicine

## 2020-08-15 MED ORDER — LISINOPRIL 20 MG PO TABS: 10.0000 mg | ORAL_TABLET | Freq: Every day | ORAL | 1 refills | Status: DC

## 2020-08-15 NOTE — Telephone Encounter (Signed)
Placed call to pt. No answer, Prompted me to leave message after the tone, there was no tone and the phone hung up. No vm left

## 2020-08-15 NOTE — Telephone Encounter (Signed)
Noted. We will see how he does with the decreased medication dose and if needed can consider work up if the fatigue is not better at his follow-up. If his BP does not increase with the decreased dose of medication he needs to let us know in the next few days.

## 2020-08-17 ENCOUNTER — Encounter: Payer: Self-pay | Admitting: Family Medicine

## 2020-08-19 ENCOUNTER — Ambulatory Visit (INDEPENDENT_AMBULATORY_CARE_PROVIDER_SITE_OTHER): Payer: Medicare Other | Admitting: Pharmacist

## 2020-08-19 DIAGNOSIS — I10 Essential (primary) hypertension: Secondary | ICD-10-CM

## 2020-08-19 DIAGNOSIS — I2510 Atherosclerotic heart disease of native coronary artery without angina pectoris: Secondary | ICD-10-CM

## 2020-08-19 DIAGNOSIS — E785 Hyperlipidemia, unspecified: Secondary | ICD-10-CM

## 2020-08-19 DIAGNOSIS — E1165 Type 2 diabetes mellitus with hyperglycemia: Secondary | ICD-10-CM

## 2020-08-19 NOTE — Patient Instructions (Signed)
Visit Information  PATIENT GOALS:  Goals Addressed               This Visit's Progress     Patient Stated     Medication Management (pt-stated)        Patient Goals/Self-Care Activities Over the next 90 days, patient will - take medications as prescribed - check glucose twice daily  collaborate with provider on medication access solutions.         Patient verbalizes understanding of instructions provided today and agrees to view in [place].   Plan: Telephone follow up appointment with care management team member scheduled for:  ~ 12 weeks  Catie Darnelle Maffucci, PharmD, [place], [place] Clinical Pharmacist [employer] at [employer] [phone]

## 2020-08-19 NOTE — Chronic Care Management (AMB) (Signed)
  Chronic Care Management Pharmacy Note  08/19/2020 Name:  Dakota Gonzalez MRN:  [id number] DOB:  07/23/1945   Subjective: Dakota Gonzalez is an 75 y.o. year old male who is a primary patient of Sonnenberg, Eric G, MD.  The CCM team was consulted for assistance with disease management and care coordination needs.    Engaged with patient by telephone for follow up visit in response to provider referral for pharmacy case management and/or care coordination services.   Consent to Services:  The patient was given information about Chronic Care Management services, agreed to services, and gave verbal consent prior to initiation of services.  Please see initial visit note for detailed documentation.   Patient Care Team: Sonnenberg, Eric G, MD as PCP - General (Family Medicine) Arida, Muhammad A, MD as Consulting Physician (Cardiology) Travis, Catherine E, RPH-CPP as Pharmacist (Pharmacist)  Objective:  Lab Results  Component Value Date   CREATININE 0.98 08/12/2020   CREATININE 0.94 02/09/2020   CREATININE 1.11 01/14/2020    Lab Results  Component Value Date   HGBA1C 7.1 (H) 08/12/2020   Last diabetic Eye exam:  Lab Results  Component Value Date/Time   HMDIABEYEEXA No Retinopathy 11/12/2019 12:00 AM    Last diabetic Foot exam:  Lab Results  Component Value Date/Time   HMDIABFOOTEX Normal 12/14/2013 12:00 AM        Component Value Date/Time   CHOL 113 05/04/2020 1011   TRIG 136.0 05/04/2020 1011   HDL 46.40 05/04/2020 1011   CHOLHDL 2 05/04/2020 1011   VLDL 27.2 05/04/2020 1011   LDLCALC 39 05/04/2020 1011   LDLDIRECT 70.0 08/29/2016 0940    Hepatic Function Latest Ref Rng & Units 05/04/2020 04/13/2019 04/09/2018  Total Protein 6.0 - 8.3 g/dL 6.9 7.5 7.8  Albumin 3.5 - 5.2 g/dL 4.4 4.8 4.9  AST 0 - 37 U/L 19 24 20  ALT 0 - 53 U/L 24 26 27  Alk Phosphatase 39 - 117 U/L 49 51 50  Total Bilirubin 0.2 - 1.2 mg/dL 1.1 1.1 1.3(H)  Bilirubin, Direct 0.0 - 0.3 mg/dL 0.2 -  -    No results found for: TSH, FREET4  CBC Latest Ref Rng & Units 11/28/2014 11/09/2014 12/14/2013  WBC 3.8 - 10.6 K/uL 9.5 8.4 7.8  Hemoglobin 13.0 - 18.0 g/dL 15.0 15.1 14.7  Hematocrit 40.0 - 52.0 % 45.9 45.4 44.6  Platelets 150 - 440 K/uL 159 167.0 167.0    No results found for: VD25OH  Clinical ASCVD: No  The ASCVD Risk score (Goff DC Jr., et al., 2013) failed to calculate for the following reasons:   The valid total cholesterol range is 130 to 320 mg/dL      Social History   Tobacco Use  Smoking Status Former  Smokeless Tobacco Never   BP Readings from Last 3 Encounters:  07/15/20 (!) 146/84  05/03/20 121/64  04/26/20 (!) 160/70   Pulse Readings from Last 3 Encounters:  07/15/20 81  05/03/20 84  04/26/20 97   Wt Readings from Last 3 Encounters:  07/15/20 193 lb 8 oz (87.8 kg)  05/03/20 200 lb (90.7 kg)  04/26/20 200 lb 3.2 oz (90.8 kg)    Assessment: Review of patient past medical history, allergies, medications, health status, including review of consultants reports, laboratory and other test data, was performed as part of comprehensive evaluation and provision of chronic care management services.   SDOH:  (Social Determinants of Health) assessments and interventions performed:  SDOH Interventions      Flowsheet Row Most Recent Value  SDOH Interventions   Financial Strain Interventions Other (Comment)  [manufacturer assistance]       CCM Care Plan  Allergies  Allergen Reactions   Aspartame Other (See Comments)    Medications Reviewed Today     Reviewed by Travis, Catherine E, RPH-CPP (Pharmacist) on 08/19/20 at 1058  Med List Status: <None>   Medication Order Taking? Sig Documenting Provider Last Dose Status Informant  aspirin 81 MG tablet [id number] Yes Take 81 mg by mouth daily. [provider] Taking Active   atorvastatin (LIPITOR) 40 MG tablet [id number] Yes TAKE 1 TABLET(40 MG) BY MOUTH DAILY Sonnenberg, Eric G, MD Taking Active    blood glucose meter kit and supplies [id number] Yes One touch ultra, test once daily, E11.9 Sonnenberg, Eric G, MD Taking Active   Blood Glucose Monitoring Suppl (ONE TOUCH ULTRA 2) w/Device KIT [id number] Yes 1 Device by Does not apply route daily as needed. Sonnenberg, Eric G, MD Taking Active   Coenzyme Q10 (COQ-10) 100 MG CAPS [id number] Yes Take 1 tablet by mouth daily. [provider] Taking Active   cyclobenzaprine (FLEXERIL) 10 MG tablet [id number] Yes Take 1 tablet (10 mg total) by mouth every 8 (eight) hours as needed for muscle spasms (do not drive while using, may make you drowsy). Walker, Jennifer A, MD Taking Active            Med Note (NWOGU, IVY A   Mon Apr 25, 2020  9:50 AM) Uses 1-2x/month  docusate sodium (COLACE) 100 MG capsule [id number] Yes Take 100 mg by mouth daily as needed for mild constipation. [provider] Taking Active Self           Med Note (NWOGU, IVY A   Mon Apr 25, 2020  9:50 AM) Taking QHS  empagliflozin (JARDIANCE) 25 MG TABS tablet [id number] Yes Take 1 tablet (25 mg total) by mouth daily. Sonnenberg, Eric G, MD Taking Active   finasteride (PROSCAR) 5 MG tablet [id number] Yes TAKE 1 TABLET(5 MG) BY MOUTH DAILY Sonnenberg, Eric G, MD Taking Active   glucose blood (ONE TOUCH ULTRA TEST) test strip [id number] Yes TEST TWICE A DAILY AS DIRECTED Sonnenberg, Eric G, MD Taking Active   Lancets (ONETOUCH DELICA PLUS LANCET33G) MISC [id number] Yes USE TO CHECK BLOOD SUGAR TWICE DAILY [provider] Taking Active   Lancets Misc. MISC [id number] Yes ONETOUCH DELICA LANCETS FINE MISC check blood sugar twice daily Sonnenberg, Eric G, MD Taking Active   lisinopril (ZESTRIL) 20 MG tablet [id number] Yes Take 0.5 tablets (10 mg total) by mouth daily. Sonnenberg, Eric G, MD Taking Active   metFORMIN (GLUCOPHAGE) 500 MG tablet [id number] Yes TAKE 2 TABLETS(1000 MG) BY MOUTH TWICE DAILY WITH A MEAL Sonnenberg, Eric G, MD Taking Active   Multiple Vitamins-Minerals  (CENTRUM SILVER PO) [id number] Yes Take 1 tablet by mouth daily. [provider] Taking Active   Semaglutide (RYBELSUS) 7 MG TABS [id number] Yes Take 7 mg by mouth daily. [provider] Taking Active   tamsulosin (FLOMAX) 0.4 MG CAPS capsule [id number] Yes TAKE 1 CAPSULE(0.4 MG) BY MOUTH DAILY Sonnenberg, Eric G, MD Taking Active             Patient Active Problem List   Diagnosis Date Noted   Urine frequency 04/26/2020   Adrenal adenoma, left 10/09/2017   BPH (benign prostatic hyperplasia) 06/20/2017   Onychomycosis 03/18/2017   Right knee pain 11/18/2015   Coronary atherosclerosis of native coronary artery 11/09/2014     Obesity (BMI 30-39.9) 03/02/2013   HTN (hypertension) 02/26/2012   Incomplete emptying of bladder 02/11/2012   Nodular prostate with urinary obstruction 02/11/2012   Restless legs 01/21/2012   Diabetes mellitus type 2, controlled ([facility]) 07/09/2011   Hyperlipidemia 07/09/2011   Elevated PSA 07/09/2011   Chronic back pain 07/09/2011    Immunization History  Administered Date(s) Administered   Fluad Quad(high Dose 65+) 09/26/2018   Influenza Split 10/17/2011   Influenza, High Dose Seasonal PF 09/19/2016, 10/03/2017   Influenza,inj,Quad PF,6+ Mos 09/22/2012, 10/10/2013, 11/10/2014   Influenza-Unspecified 11/03/2017, 10/20/2019   Moderna SARS-COV2 Booster Vaccination 10/16/2019   Moderna Sars-Covid-2 Vaccination 02/27/2019, 03/27/2019   Pneumococcal Conjugate-13 06/12/2013   Pneumococcal Polysaccharide-23 10/17/2011   Tdap 03/20/2017    Conditions to be addressed/monitored: HTN, HLD, and DMII  Care Plan : Medication Management  Updates made by Travis, Catherine E, RPH-CPP since 08/19/2020 12:00 AM     Problem: Diabetes, Hypertension      Long-Range Goal: Disease Progression Prevention   Start Date: 06/01/2020  This Visit's Progress: On track  Recent Progress: On track  Priority: High  Note:   Current Barriers:  Unable to achieve  control of diabetes   Pharmacist Clinical Goal(s):  Over the next 30 days, patient will verbalize ability to afford treatment regimen through collaboration with PharmD and provider.  Over the next 90 days, patient will maintain control of diabetes as evidenced by A1c through collaboration with PharmD and provider.  Interventions: 1:1 collaboration with Sonnenberg, Eric G, MD regarding development and update of comprehensive plan of care as evidenced by provider attestation and co-signature Inter-disciplinary care team collaboration (see longitudinal plan of care) Comprehensive medication review performed; medication list updated in electronic medical   Heath Maintenance: Due for Shingrix. Discussed. He will talk to [facility] about that when he goes for his flu shot  Diabetes: Uncontrolled, though improvement in glucose readings; current treatment: metformin 1000 mg BID, Jardiance 25 mg daily, Rybelsus 7 mg daily Reports being pleased with weight loss, glucose readings. Confirms appropriate adminstration.  Enrolled in [employer] for Jardiance patient assistance Approved with [facility] for Rybelsus  Current glucose readings: fasting: 90-120s; 1-3 hour post prandial: all <160 Sending refill request for Rybelsus 7 mg daily to [facility] [facility] today.  Recommend to continue current regimen at this time  Hypertension: Controlled per patient report; current treatment: lisinopril 10 mg QAM (1/2 of 20 mg tab) Home BP readings: 90-110s/50-60s. Reports some lightheadedness if he moves too quickly Advised to bring BP machine to upcoming PCP visit for validation. Discussed that he may need further reduction to lisinopril 5 mg daily. Recommended to continue current regimen at this time.  Hyperlipidemia and ASCVD prevention with hx of CAD Controlled per last lipid panel; current treatment: atorvastatin 40 mg daily Current antiplatelet regimen: aspirin 81 mg daily Recommended to continue current  treatment regimen  BPH: Controlled per patient symptom report; current treatment: finasteride 5 mg daily, tamsulosin 0.4 mg daily Previously recommend to continue current regimen at this time  Patient Goals/Self-Care Activities Over the next 90 days, patient will:  - take medications as prescribed collaborate with provider on medication access solutions  Follow Up Plan: Telephone follow up appointment with care management team member scheduled for: ~12 weeks      Medication Assistance:  Jardiance, obtained through [employer] medication assistance program.  Enrollment ends 01/14/21 . Rybelsus obtained through [employer] medication assistance program.  Enrollment ends 12/14/20  Patient's preferred pharmacy is:  [facility]  [place], [place] AT [facility] OF [address] & [place] [address] Phone: [phone] Fax: [phone]   Follow Up:  Patient agrees to Care Plan and Follow-up.  Plan: Telephone follow up appointment with care management team member scheduled for:  ~ 12 weeks  Catie Darnelle Maffucci, PharmD, [place], [place] Clinical Pharmacist [employer] [facility] [employer] [phone]

## 2020-08-23 ENCOUNTER — Ambulatory Visit: Payer: Medicare Other | Admitting: Pharmacist

## 2020-08-23 DIAGNOSIS — I10 Essential (primary) hypertension: Secondary | ICD-10-CM

## 2020-08-23 DIAGNOSIS — E785 Hyperlipidemia, unspecified: Secondary | ICD-10-CM

## 2020-08-23 DIAGNOSIS — E1165 Type 2 diabetes mellitus with hyperglycemia: Secondary | ICD-10-CM

## 2020-08-23 NOTE — Chronic Care Management (AMB) (Signed)
Chronic Care Management Pharmacy Note  08/23/2020 Name:  Dakota Gonzalez MRN:  [id number] DOB:  December 23, 1945   Subjective: Dakota Gonzalez is an 75 y.o. year old male who is a primary patient of Caryl Bis, Angela Adam, MD.  The CCM team was consulted for assistance with disease management and care coordination needs.    Care coordination  for  medication access  in response to provider referral for pharmacy case management and/or care coordination services.   Consent to Services:  The patient was given information about Chronic Care Management services, agreed to services, and gave verbal consent prior to initiation of services.  Please see initial visit note for detailed documentation.   Patient Care Team: [name], MD as PCP - General (Family Medicine) Wellington Hampshire, MD as Consulting Physician (Cardiology) De Hollingshead, RPH-CPP as Pharmacist (Pharmacist)  Objective:  Lab Results  Component Value Date   CREATININE 0.98 08/12/2020   CREATININE 0.94 02/09/2020   CREATININE 1.11 01/14/2020    Lab Results  Component Value Date   HGBA1C 7.1 (H) 08/12/2020   Last diabetic Eye exam:  Lab Results  Component Value Date/Time   HMDIABEYEEXA No Retinopathy 11/12/2019 12:00 AM    Last diabetic Foot exam:  Lab Results  Component Value Date/Time   HMDIABFOOTEX Normal 12/14/2013 12:00 AM        Component Value Date/Time   CHOL 113 05/04/2020 1011   TRIG 136.0 05/04/2020 1011   HDL 46.40 05/04/2020 1011   CHOLHDL 2 05/04/2020 1011   VLDL 27.2 05/04/2020 1011   LDLCALC 39 05/04/2020 1011   LDLDIRECT 70.0 08/29/2016 0940    Hepatic Function Latest Ref Rng & Units 05/04/2020 04/13/2019 04/09/2018  Total Protein 6.0 - 8.3 g/dL 6.9 7.5 7.8  Albumin 3.5 - 5.2 g/dL 4.4 4.8 4.9  AST 0 - 37 U/L '19 24 20  ' ALT 0 - 53 U/L '24 26 27  ' Alk Phosphatase 39 - 117 U/L 49 51 50  Total Bilirubin 0.2 - 1.2 mg/dL 1.1 1.1 1.3(H)  Bilirubin, Direct 0.0 - 0.3 mg/dL 0.2 - -    No  results found for: TSH, FREET4  CBC Latest Ref Rng & Units 11/28/2014 11/09/2014 12/14/2013  WBC 3.8 - 10.6 K/uL 9.5 8.4 7.8  Hemoglobin 13.0 - 18.0 g/dL 15.0 15.1 14.7  Hematocrit 40.0 - 52.0 % 45.9 45.4 44.6  Platelets 150 - 440 K/uL 159 167.0 167.0    No results found for: VD25OH  Clinical ASCVD: No  The ASCVD Risk score Mikey Bussing DC Jr., et al., 2013) failed to calculate for the following reasons:   The valid total cholesterol range is 130 to 320 mg/dL      Social History   Tobacco Use  Smoking Status Former  Smokeless Tobacco Never   BP Readings from Last 3 Encounters:  07/15/20 (!) 146/84  05/03/20 121/64  04/26/20 (!) 160/70   Pulse Readings from Last 3 Encounters:  07/15/20 81  05/03/20 84  04/26/20 97   Wt Readings from Last 3 Encounters:  07/15/20 193 lb 8 oz (87.8 kg)  05/03/20 200 lb (90.7 kg)  04/26/20 200 lb 3.2 oz (90.8 kg)    Assessment: Review of patient past medical history, allergies, medications, health status, including review of consultants reports, laboratory and other test data, was performed as part of comprehensive evaluation and provision of chronic care management services.   SDOH:  (Social Determinants of Health) assessments and interventions performed:  SDOH Interventions  Flowsheet Row Most Recent Value  SDOH Interventions   Financial Strain Interventions Other (Comment)  [manufacturer assistance]       CCM Care Plan  Allergies  Allergen Reactions   Aspartame Other (See Comments)    Medications Reviewed Today     Reviewed by De Hollingshead, RPH-CPP (Pharmacist) on 08/19/20 at 1058  Med List Status: <None>   Medication Order Taking? Sig Documenting Provider Last Dose Status Informant  aspirin 81 MG tablet [id number] Yes Take 81 mg by mouth daily. [provider] Taking Active   atorvastatin (LIPITOR) 40 MG tablet [id number] Yes TAKE 1 TABLET(40 MG) BY MOUTH DAILY [name], MD Taking Active   blood  glucose meter kit and supplies [id number] Yes One touch ultra, test once daily, E11.9 [name], MD Taking Active   Blood Glucose Monitoring Suppl (ONE TOUCH ULTRA 2) w/Device KIT [id number] Yes 1 Device by Does not apply route daily as needed. [name], MD Taking Active   Coenzyme Q10 (COQ-10) 100 MG CAPS [id number] Yes Take 1 tablet by mouth daily. [provider] Taking Active   cyclobenzaprine (FLEXERIL) 10 MG tablet [id number] Yes Take 1 tablet (10 mg total) by mouth every 8 (eight) hours as needed for muscle spasms (do not drive while using, may make you drowsy). Jackolyn Confer, MD Taking Active            Med Note [facility], IVY A   Mon Apr 25, 2020  9:50 AM) Uses 1-2x/month  docusate sodium (COLACE) 100 MG capsule [id number] Yes Take 100 mg by mouth daily as needed for mild constipation. [provider] Taking Active Self           Med Note (NWOGU, IVY A   Mon Apr 25, 2020  9:50 AM) Taking QHS  empagliflozin (JARDIANCE) 25 MG TABS tablet [id number] Yes Take 1 tablet (25 mg total) by mouth daily. [name], MD Taking Active   finasteride (PROSCAR) 5 MG tablet [id number] Yes TAKE 1 TABLET(5 MG) BY MOUTH DAILY [name], MD Taking Active   glucose blood (ONE TOUCH ULTRA TEST) test strip [id number] Yes TEST TWICE A DAILY AS DIRECTED [name], MD Taking Active   Lancets (ONETOUCH DELICA PLUS TKPTWS56C) [place] [id number] Yes USE TO CHECK BLOOD SUGAR TWICE DAILY [provider] Taking Active   Lancets Misc. [place] [id number] Yes ONETOUCH DELICA LANCETS FINE MISC check blood sugar twice daily [name], MD Taking Active   lisinopril (ZESTRIL) 20 MG tablet [id number] Yes Take 0.5 tablets (10 mg total) by mouth daily. [name], MD Taking Active   metFORMIN (GLUCOPHAGE) 500 MG tablet [id number] Yes TAKE 2 TABLETS(1000 MG) BY MOUTH TWICE DAILY WITH A MEAL [name], MD Taking Active   Multiple Vitamins-Minerals (CENTRUM  SILVER PO) [id number] Yes Take 1 tablet by mouth daily. [provider] Taking Active   Semaglutide (RYBELSUS) 7 MG TABS [id number] Yes Take 7 mg by mouth daily. [provider] Taking Active   tamsulosin (FLOMAX) 0.4 MG CAPS capsule [id number] Yes TAKE 1 CAPSULE(0.4 MG) BY MOUTH DAILY [name], MD Taking Active             Patient Active Problem List   Diagnosis Date Noted   Urine frequency 04/26/2020   Adrenal adenoma, left 10/09/2017   BPH (benign prostatic hyperplasia) 06/20/2017   Onychomycosis 03/18/2017   Right knee pain 11/18/2015   Coronary atherosclerosis of native coronary artery 11/09/2014  Obesity (BMI 30-39.9) 03/02/2013   HTN (hypertension) 02/26/2012   Incomplete emptying of bladder 02/11/2012   Nodular prostate with urinary obstruction 02/11/2012   Restless legs 01/21/2012   Diabetes mellitus type 2, controlled ([place]) 07/09/2011   Hyperlipidemia 07/09/2011   Elevated PSA 07/09/2011   Chronic back pain 07/09/2011    Immunization History  Administered Date(s) Administered   Fluad Quad(high Dose 65+) 09/26/2018   Influenza Split 10/17/2011   Influenza, High Dose Seasonal PF 09/19/2016, 10/03/2017   Influenza,inj,Quad PF,6+ Mos 09/22/2012, 10/10/2013, 11/10/2014   Influenza-Unspecified 11/03/2017, 10/20/2019   Moderna SARS-COV2 Booster Vaccination 10/16/2019   Moderna Sars-Covid-2 Vaccination 02/27/2019, 03/27/2019   Pneumococcal Conjugate-13 06/12/2013   Pneumococcal Polysaccharide-23 10/17/2011   Tdap 03/20/2017    Conditions to be addressed/monitored: HTN, HLD, and DMII  Care Plan : Medication Management  Updates made by De Hollingshead, RPH-CPP since 08/23/2020 12:00 AM     Problem: Diabetes, Hypertension      Long-Range Goal: Disease Progression Prevention   Start Date: 06/01/2020  Recent Progress: On track  Priority: High  Note:   Current Barriers:  Unable to achieve control of diabetes   Pharmacist Clinical  Goal(s):  Over the next 30 days, patient will verbalize ability to afford treatment regimen through collaboration with PharmD and provider.  Over the next 90 days, patient will maintain control of diabetes as evidenced by A1c through collaboration with PharmD and provider.  Interventions: 1:1 collaboration with [name], MD regarding development and update of comprehensive plan of care as evidenced by provider attestation and co-signature Inter-disciplinary care team collaboration (see longitudinal plan of care) Comprehensive medication review performed; medication list updated in electronic medical   Heath Maintenance: Due for Shingrix. Discussed. He will talk to [facility] about that when he goes for his flu shot  Diabetes: Uncontrolled, though improvement in glucose readings; current treatment: metformin 1000 mg BID, Jardiance 25 mg daily, Rybelsus 7 mg daily Enrolled in [place] for Jardiance patient assistance and [employer] for Rybelsus through [facility] to follow up on order for Rybelsus 7 mg daily. Order processed 8/9 - should arrive to our office in 10-14 business days.  Hypertension: Controlled per patient report; current treatment: lisinopril 10 mg QAM (1/2 of 20 mg tab) Previously advised to bring BP machine to upcoming PCP visit for validation. Discussed that he may need further reduction to lisinopril 5 mg daily. Previously recommended to continue current regimen at this time.  Hyperlipidemia and ASCVD prevention with hx of CAD Controlled per last lipid panel; current treatment: atorvastatin 40 mg daily Current antiplatelet regimen: aspirin 81 mg daily Previously recommended to continue current treatment regimen  BPH: Controlled per patient symptom report; current treatment: finasteride 5 mg daily, tamsulosin 0.4 mg daily Previously recommend to continue current regimen at this time  Patient Goals/Self-Care Activities Over the next 90 days,  patient will:  - take medications as prescribed collaborate with provider on medication access solutions  Follow Up Plan: Telephone follow up appointment with care management team member scheduled for: ~10 weeks as previously scheduled      Medication Assistance: Jardiance, obtained through [place] medication assistance program.  Enrollment ends 01/14/21 . Rybelsus obtained through [employer] medication assistance program.  Enrollment ends 12/14/20  Patient's preferred pharmacy is:  [id number] - [address], [place] AT [place] [address] Phone: [phone] Fax: [phone]  Follow Up:  Patient agrees to Care Plan and Follow-up.  Plan: Telephone follow up appointment with care management team member scheduled for:  ~ 10 weeks as previously scheduled  Catie Darnelle Maffucci, PharmD, [place], [place] Clinical Pharmacist [employer] at [employer] [phone]

## 2020-08-23 NOTE — Patient Instructions (Signed)
Visit Information  PATIENT GOALS:  Goals Addressed               This Visit's Progress     Patient Stated     Medication Management (pt-stated)        Patient Goals/Self-Care Activities Over the next 90 days, patient will - take medications as prescribed - check glucose twice daily  collaborate with provider on medication access solutions.        Patient verbalizes understanding of instructions provided today and agrees to view in [place].  Plan: Telephone follow up appointment with care management team member scheduled for:  ~ 10 weeks as previously scheduled  Catie Darnelle Maffucci, PharmD, [place], [place] Clinical Pharmacist [employer] at [employer] [phone]

## 2020-08-31 ENCOUNTER — Other Ambulatory Visit: Payer: Self-pay

## 2020-08-31 ENCOUNTER — Ambulatory Visit (INDEPENDENT_AMBULATORY_CARE_PROVIDER_SITE_OTHER): Payer: Medicare Other | Admitting: Family Medicine

## 2020-08-31 ENCOUNTER — Encounter: Payer: Self-pay | Admitting: Family Medicine

## 2020-08-31 DIAGNOSIS — E663 Overweight: Secondary | ICD-10-CM | POA: Diagnosis not present

## 2020-08-31 DIAGNOSIS — I10 Essential (primary) hypertension: Secondary | ICD-10-CM

## 2020-08-31 NOTE — Progress Notes (Signed)
[name], MD Phone: [phone]  Dakota Gonzalez is a 75 y.o. male who presents today for f/u.  HYPERTENSION Disease Monitoring Home BP Monitoring 100-110/60's. Chest pain-no    dyspnea-no Medications Compliance-taking lisinopril 10 mg once daily, cut down from his 20 mg dose given blood pressures in the 80s over 50s previously. Lightheadedness-no edema-no No syncope when his blood pressure was low.  Overweight: Patient notes he is down about 20 pounds.  He is not eating as much as he was previously and is not snacking in between.  He is also exercising 5 days a week with walking on the treadmill for at least 15 to 20 minutes.   Social History   Tobacco Use  Smoking Status Former  Smokeless Tobacco Never    Current Outpatient Medications on File Prior to Visit  Medication Sig Dispense Refill   aspirin 81 MG tablet Take 81 mg by mouth daily.     atorvastatin (LIPITOR) 40 MG tablet TAKE 1 TABLET(40 MG) BY MOUTH DAILY 90 tablet 0   blood glucose meter kit and supplies One touch ultra, test once daily, E11.9 1 each 0   Blood Glucose Monitoring Suppl (ONE TOUCH ULTRA 2) w/Device KIT 1 Device by Does not apply route daily as needed. 1 kit 0   Coenzyme Q10 (COQ-10) 100 MG CAPS Take 1 tablet by mouth daily.     cyclobenzaprine (FLEXERIL) 10 MG tablet Take 1 tablet (10 mg total) by mouth every 8 (eight) hours as needed for muscle spasms (do not drive while using, may make you drowsy). 60 tablet 1   docusate sodium (COLACE) 100 MG capsule Take 100 mg by mouth daily as needed for mild constipation.     empagliflozin (JARDIANCE) 25 MG TABS tablet Take 1 tablet (25 mg total) by mouth daily. 90 tablet 3   finasteride (PROSCAR) 5 MG tablet TAKE 1 TABLET(5 MG) BY MOUTH DAILY 90 tablet 1   glucose blood (ONE TOUCH ULTRA TEST) test strip TEST TWICE A DAILY AS DIRECTED 200 each 5   Lancets (ONETOUCH DELICA PLUS [id number]) MISC USE TO CHECK BLOOD SUGAR TWICE DAILY     Lancets Misc. MISC  ONETOUCH DELICA LANCETS FINE MISC check blood sugar twice daily 200 each 5   lisinopril (ZESTRIL) 20 MG tablet Take 0.5 tablets (10 mg total) by mouth daily. 45 tablet 1   metFORMIN (GLUCOPHAGE) 500 MG tablet TAKE 2 TABLETS(1000 MG) BY MOUTH TWICE DAILY WITH A MEAL 360 tablet 3   Multiple Vitamins-Minerals (CENTRUM SILVER PO) Take 1 tablet by mouth daily.     Semaglutide (RYBELSUS) 7 MG TABS Take 7 mg by mouth daily.     tamsulosin (FLOMAX) 0.4 MG CAPS capsule TAKE 1 CAPSULE(0.4 MG) BY MOUTH DAILY 90 capsule 3   No current facility-administered medications on file prior to visit.     ROS see history of present illness  Objective  Physical Exam Vitals:   08/31/20 1003  BP: (!) 150/80  Pulse: 98  Temp: 98.4 F (36.9 C)  SpO2: 98%    BP Readings from Last 3 Encounters:  08/31/20 (!) 150/80  07/15/20 (!) 146/84  05/03/20 121/64   Wt Readings from Last 3 Encounters:  08/31/20 193 lb 12.8 oz (87.9 kg)  07/15/20 193 lb 8 oz (87.8 kg)  05/03/20 200 lb (90.7 kg)    Physical Exam Constitutional:      General: He is not in acute distress.    Appearance: He is not diaphoretic.  Cardiovascular:  Rate and Rhythm: Normal rate and regular rhythm.     Heart sounds: Normal heart sounds.  Pulmonary:     Effort: Pulmonary effort is normal.     Breath sounds: Normal breath sounds.  Musculoskeletal:     Right lower leg: No edema.     Left lower leg: No edema.  Skin:    General: Skin is warm and dry.  Neurological:     Mental Status: He is alert.     Assessment/Plan: Please see individual problem list.  Problem List Items Addressed This Visit     HTN (hypertension) (Chronic)    Much improved after decreasing the dose of his lisinopril.  He will continue lisinopril 10 mg once daily.  Prior low blood pressures likely related to weight loss in combination with his lisinopril dose being too high with the weight loss.  He will let us know if he has recurrent low blood pressures.       Overweight    Patient's weight has trended down with dietary changes and exercise.  I suspect Rybelsus has contributed to some of his decreased eating.  He will continue exercise and dietary changes.       Return for As scheduled in November.  This visit occurred during the SARS-CoV-2 public health emergency.  Safety protocols were in place, including screening questions prior to the visit, additional usage of staff PPE, and extensive cleaning of exam room while observing appropriate contact time as indicated for disinfecting solutions.    [name], MD [place]

## 2020-08-31 NOTE — Assessment & Plan Note (Signed)
Patient's weight has trended down with dietary changes and exercise.  I suspect Rybelsus has contributed to some of his decreased eating.  He will continue exercise and dietary changes.

## 2020-08-31 NOTE — Patient Instructions (Signed)
Nice to see you. Your Rybelsus should come in sometime towards the end of next week.  Somebody should contact you when it is available for pickup. Please let us know if you develop low blood pressures again.

## 2020-08-31 NOTE — Assessment & Plan Note (Signed)
Much improved after decreasing the dose of his lisinopril.  He will continue lisinopril 10 mg once daily.  Prior low blood pressures likely related to weight loss in combination with his lisinopril dose being too high with the weight loss.  He will let us know if he has recurrent low blood pressures.

## 2020-09-06 ENCOUNTER — Telehealth: Payer: Self-pay

## 2020-09-06 NOTE — Telephone Encounter (Signed)
I called and spoke with the patient and informed him that his medication Rybelsus has arrived at the office and he can pick it up between 8am-5pm Monday-Friday and he understood. Monnie Gudgel,cma

## 2020-09-13 ENCOUNTER — Encounter: Payer: Self-pay | Admitting: Family Medicine

## 2020-09-14 DIAGNOSIS — E785 Hyperlipidemia, unspecified: Secondary | ICD-10-CM

## 2020-09-14 DIAGNOSIS — E1165 Type 2 diabetes mellitus with hyperglycemia: Secondary | ICD-10-CM

## 2020-09-14 DIAGNOSIS — I2510 Atherosclerotic heart disease of native coronary artery without angina pectoris: Secondary | ICD-10-CM

## 2020-09-14 DIAGNOSIS — I10 Essential (primary) hypertension: Secondary | ICD-10-CM | POA: Diagnosis not present

## 2020-10-07 ENCOUNTER — Other Ambulatory Visit: Payer: Self-pay | Admitting: Family Medicine

## 2020-10-07 DIAGNOSIS — E1159 Type 2 diabetes mellitus with other circulatory complications: Secondary | ICD-10-CM

## 2020-10-18 ENCOUNTER — Other Ambulatory Visit: Payer: Self-pay | Admitting: Family Medicine

## 2020-10-20 ENCOUNTER — Encounter: Payer: Self-pay | Admitting: Family Medicine

## 2020-10-20 DIAGNOSIS — E1159 Type 2 diabetes mellitus with other circulatory complications: Secondary | ICD-10-CM

## 2020-10-20 DIAGNOSIS — I10 Essential (primary) hypertension: Secondary | ICD-10-CM

## 2020-10-20 MED ORDER — GLUCOSE BLOOD VI STRP: ORAL_STRIP | 5 refills | Status: DC

## 2020-10-20 MED ORDER — LISINOPRIL 20 MG PO TABS: 10.0000 mg | ORAL_TABLET | Freq: Every day | ORAL | 1 refills | Status: DC

## 2020-10-21 ENCOUNTER — Ambulatory Visit: Payer: Medicare Other | Admitting: Pharmacist

## 2020-10-21 DIAGNOSIS — E1165 Type 2 diabetes mellitus with hyperglycemia: Secondary | ICD-10-CM

## 2020-10-21 DIAGNOSIS — I10 Essential (primary) hypertension: Secondary | ICD-10-CM

## 2020-10-21 DIAGNOSIS — E1159 Type 2 diabetes mellitus with other circulatory complications: Secondary | ICD-10-CM

## 2020-10-21 MED ORDER — GLUCOSE BLOOD VI STRP: ORAL_STRIP | 5 refills | Status: DC

## 2020-10-21 MED ORDER — LISINOPRIL 10 MG PO TABS: 10.0000 mg | ORAL_TABLET | Freq: Every day | ORAL | 3 refills | Status: DC

## 2020-10-21 MED ORDER — RYBELSUS 7 MG PO TABS
7.0000 mg | ORAL_TABLET | Freq: Every day | ORAL | 0 refills | Status: DC
Start: 2020-10-21 — End: 2020-11-14

## 2020-10-21 NOTE — Patient Instructions (Signed)
Visit Information  PATIENT GOALS:  Goals Addressed               This Visit's Progress     Patient Stated     Medication Management (pt-stated)        Patient Goals/Self-Care Activities Over the next 90 days, patient will - take medications as prescribed - check glucose twice daily  collaborate with provider on medication access solutions.         Patient verbalizes understanding of instructions provided today and agrees to view in [place].     Plan: Telephone follow up appointment with care management team member scheduled for:  4 weeks as previously scheduled   Catie Darnelle Maffucci, PharmD, [place], [place] Clinical Pharmacist [employer] at [employer] [phone]

## 2020-10-21 NOTE — Chronic Care Management (AMB) (Signed)
**Note Dakota-Identified via Obfuscation** Chronic Care Management Pharmacy Note  10/21/2020 Name:  Dakota Gonzalez MRN:  [id number] DOB:  09-08-1945  Subjective: Dakota Gonzalez is an 75 y.o. year old male who is a primary patient of Dakota Gonzalez, Dakota Adam, MD.  The CCM team was consulted for assistance with disease management and care coordination needs.    Engaged with patient by telephone for follow up visit in response to provider referral for pharmacy case management and/or care coordination services.   Consent to Services:  The patient was given information about Chronic Care Management services, agreed to services, and gave verbal consent prior to initiation of services.  Please see initial visit note for detailed documentation.   Patient Care Team: Dakota Haven, MD as PCP - General (Family Medicine) Dakota Hampshire, MD as Consulting Physician (Cardiology) Dakota Gonzalez, RPH-CPP as Pharmacist (Pharmacist)   Objective:  Lab Results  Component Value Date   CREATININE 0.98 08/12/2020   CREATININE 0.94 02/09/2020   CREATININE 1.11 01/14/2020    Lab Results  Component Value Date   HGBA1C 7.1 (H) 08/12/2020   Last diabetic Eye exam:  Lab Results  Component Value Date/Time   HMDIABEYEEXA No Retinopathy 11/12/2019 12:00 AM    Last diabetic Foot exam:  Lab Results  Component Value Date/Time   HMDIABFOOTEX Normal 12/14/2013 12:00 AM        Component Value Date/Time   CHOL 113 05/04/2020 1011   TRIG 136.0 05/04/2020 1011   HDL 46.40 05/04/2020 1011   CHOLHDL 2 05/04/2020 1011   VLDL 27.2 05/04/2020 1011   LDLCALC 39 05/04/2020 1011   LDLDIRECT 70.0 08/29/2016 0940    Hepatic Function Latest Ref Rng & Units 05/04/2020 04/13/2019 04/09/2018  Total Protein 6.0 - 8.3 g/dL 6.9 7.5 7.8  Albumin 3.5 - 5.2 g/dL 4.4 4.8 4.9  AST 0 - 37 U/L _0 ALT 0 - 53 U/L _1 Alk Phosphatase 39 - 117 U/L 49 51 50  Total Bilirubin 0.2 - 1.2 mg/dL 1.1 1.1 1.3(H)  Bilirubin, Direct 0.0 - 0.3 mg/dL 0.2  - -    No results found for: TSH, FREET4  CBC Latest Ref Rng & Units 11/28/2014 11/09/2014 12/14/2013  WBC 3.8 - 10.6 K/uL 9.5 8.4 7.8  Hemoglobin 13.0 - 18.0 g/dL 15.0 15.1 14.7  Hematocrit 40.0 - 52.0 % 45.9 45.4 44.6  Platelets 150 - 440 K/uL 159 167.0 167.0    Social History   Tobacco Use  Smoking Status Former  Smokeless Tobacco Never   BP Readings from Last 3 Encounters:  08/31/20 (!) 150/80  07/15/20 (!) 146/84  05/03/20 121/64   Pulse Readings from Last 3 Encounters:  08/31/20 98  07/15/20 81  05/03/20 84   Wt Readings from Last 3 Encounters:  08/31/20 193 lb 12.8 oz (87.9 kg)  07/15/20 193 lb 8 oz (87.8 kg)  05/03/20 200 lb (90.7 kg)    Assessment: Review of patient past medical history, allergies, medications, health status, including review of consultants reports, laboratory and other test data, was performed as part of comprehensive evaluation and provision of chronic care management services.   SDOH:  (Social Determinants of Health) assessments and interventions performed:  SDOH Interventions    Flowsheet Row Most Recent Value  SDOH Interventions   Financial Strain Interventions Other (Comment)  [manufacturer assistance]       CCM Care Plan  Allergies  Allergen Reactions   Aspartame Other (See Comments)    Medications  Reviewed Today     Reviewed by Gordy Councilman, CMA (Certified Medical Assistant) on 08/31/20 at [place] List Status: <None>   Medication Order Taking? Sig Documenting Provider Last Dose Status Informant  aspirin 81 MG tablet [id number] Yes Take 81 mg by mouth daily. [provider] Taking Active   atorvastatin (LIPITOR) 40 MG tablet [id number] Yes TAKE 1 TABLET(40 MG) BY MOUTH DAILY Dakota Haven, MD Taking Active   blood glucose meter kit and supplies [id number] Yes One touch ultra, test once daily, E11.9 Dakota Haven, MD Taking Active   Blood Glucose Monitoring Suppl (ONE TOUCH ULTRA 2) w/Device KIT  [id number] Yes 1 Device by Does not apply route daily as needed. Dakota Haven, MD Taking Active   Coenzyme Q10 (COQ-10) 100 MG CAPS [id number] Yes Take 1 tablet by mouth daily. [provider] Taking Active   cyclobenzaprine (FLEXERIL) 10 MG tablet [id number] Yes Take 1 tablet (10 mg total) by mouth every 8 (eight) hours as needed for muscle spasms (do not drive while using, may make you drowsy). Dakota Confer, MD Taking Active            Med Note [facility], IV[facility] A   Mon Apr 25, 2020  9:50 AM) Uses 1-2x/month  docusate sodium (COLACE) 100 MG capsule [id number] Yes Take 100 mg by mouth daily as needed for mild constipation. [provider] Taking Active Self           Med Note ([facility], IVY A   Mon Apr 25, 2020  9:50 AM) Taking QHS  empagliflozin (JARDIANCE) 25 MG TABS tablet [id number] Yes Take 1 tablet (25 mg total) by mouth daily. Dakota Haven, MD Taking Active   finasteride (PROSCAR) 5 MG tablet [id number] Yes TAKE 1 TABLET(5 MG) BY MOUTH DAILY Dakota Haven, MD Taking Active   glucose blood (ONE TOUCH ULTRA TEST) test strip [id number] Yes TEST TWICE A DAILY AS DIRECTED Dakota Haven, MD Taking Active   Lancets (ONETOUCH DELICA PLUS [id number]) [place] [id number] Yes USE TO CHECK BLOOD SUGAR TWICE DAILY [provider] Taking Active   Lancets Misc. [place] [id number] Yes ONETOUCH DELICA LANCETS FINE MISC check blood sugar twice daily Dakota Haven, MD Taking Active   lisinopril (ZESTRIL) 20 MG tablet [id number] Yes Take 0.5 tablets (10 mg total) by mouth daily. Dakota Haven, MD Taking Active   metFORMIN (GLUCOPHAGE) 500 MG tablet [id number] Yes TAKE 2 TABLETS(1000 MG) BY MOUTH TWICE DAILY WITH A MEAL Dakota Haven, MD Taking Active   Multiple Vitamins-Minerals (CENTRUM SILVER PO) [id number] Yes Take 1 tablet by mouth daily. [provider] Taking Active   Semaglutide (RYBELSUS) 7 MG TABS [id number] Yes Take 7 mg by mouth daily. [provider] Taking Active   tamsulosin (FLOMAX) 0.4 MG CAPS capsule [id number] Yes TAKE 1 CAPSULE(0.4 MG) BY MOUTH DAILY Dakota Haven, MD Taking Active             Patient Active Problem List   Diagnosis Date Noted   Urine frequency 04/26/2020   Adrenal adenoma, left 10/09/2017   BPH (benign prostatic hyperplasia) 06/20/2017   Onychomycosis 03/18/2017   Right knee pain 11/18/2015   Coronary atherosclerosis of native coronary artery 11/09/2014   Overweight 03/02/2013   HTN (hypertension) 02/26/2012   Incomplete emptying of bladder 02/11/2012   Nodular prostate with urinary obstruction 02/11/2012   Restless legs 01/21/2012   Diabetes mellitus type 2, controlled ([place]) 07/09/2011   Hyperlipidemia 07/09/2011  Elevated PSA 07/09/2011   Chronic back pain 07/09/2011    Immunization History  Administered Date(s) Administered   Fluad Quad(high Dose 65+) 09/26/2018   Influenza Split 10/17/2011   Influenza, High Dose Seasonal PF 09/19/2016, 10/03/2017   Influenza,inj,Quad PF,6+ Mos 09/22/2012, 10/10/2013, 11/10/2014   Influenza-Unspecified 11/03/2017, 10/20/2019, 09/06/2020   Moderna SARS-COV2 Booster Vaccination 10/16/2019   Moderna Sars-Covid-2 Vaccination 02/27/2019, 03/27/2019   Pneumococcal Conjugate-13 06/12/2013   Pneumococcal Polysaccharide-23 10/17/2011   Tdap 03/20/2017   Zoster Recombinat (Shingrix) 09/06/2020    Conditions to be addressed/monitored: HTN and DMII  Care Plan : Medication Management  Updates made by Travis, Catherine E, RPH-CPP since 10/21/2020 12:00 AM     Problem: Diabetes, Hypertension      Long-Range Goal: Disease Progression Prevention   Start Date: 06/01/2020  This Visit's Progress: On track  Recent Progress: On track  Priority: High  Note:   Current Barriers:  Unable to achieve control of diabetes   Pharmacist Clinical Goal(s):  Over the next 30 days, patient will verbalize ability to afford treatment regimen through  collaboration with PharmD and provider.  Over the next 90 days, patient will maintain control of diabetes as evidenced by A1c through collaboration with PharmD and provider.  Interventions: 1:1 collaboration with Sonnenberg, Eric G, MD regarding development and update of comprehensive plan of care as evidenced by provider attestation and co-signature Inter-disciplinary care team collaboration (see longitudinal plan of care) Comprehensive medication review performed; medication list updated in electronic medical    Diabetes: Uncontrolled, though improvement in glucose readings; current treatment: metformin 1000 mg BID, Jardiance 25 mg daily, Rybelsus 7 mg daily Enrolled in [employer] for Jardiance patient assistance and [employer] for Rybelsus through 2022 Refill sent for Rybelsus 7 mg to [facility]. Refill for test strips also sent.   Hypertension: Controlled per patient report; current treatment: lisinopril 10 mg QAM (1/2 of 20 mg tab) Refill sent for lisinopril 10 mg tablet strength to pharmacy.   Hyperlipidemia and ASCVD prevention with hx of CAD Controlled per last lipid panel; current treatment: atorvastatin 40 mg daily Current antiplatelet regimen: aspirin 81 mg daily Previously recommended to continue current treatment regimen  BPH: Controlled per patient symptom report; current treatment: finasteride 5 mg daily, tamsulosin 0.4 mg daily Previously recommend to continue current regimen at this time  Patient Goals/Self-Care Activities Over the next 90 days, patient will:  - take medications as prescribed collaborate with provider on medication access solutions  Follow Up Plan: Telephone follow up appointment with care management team member scheduled for: ~4 weeks as previously scheduled      Medication Assistance:  Jardiance, Rybelsus obtained through [employer], Novo Nordiks medication assistance program.  Enrollment ends 01/14/21  Patient's preferred pharmacy  is:  [id number] - [place], [place] - [address] [place] [address] Phone: [phone] Fax: [phone]   Follow Up:  Patient agrees to Care Plan and Follow-up.  Plan: Telephone follow up appointment with care management team member scheduled for:  4 weeks as previously scheduled  Catie Travis, PharmD, BCACP, CPP Clinical Pharmacist [facility] at [place] [phone]      

## 2020-10-27 ENCOUNTER — Ambulatory Visit (INDEPENDENT_AMBULATORY_CARE_PROVIDER_SITE_OTHER): Payer: Medicare Other | Admitting: Pharmacist

## 2020-10-27 DIAGNOSIS — E1159 Type 2 diabetes mellitus with other circulatory complications: Secondary | ICD-10-CM

## 2020-10-27 DIAGNOSIS — E785 Hyperlipidemia, unspecified: Secondary | ICD-10-CM

## 2020-10-27 NOTE — Chronic Care Management (AMB) (Signed)
Chronic Care Management Pharmacy Note  10/27/2020 Name:  Dakota Gonzalez MRN:  [id number] DOB:  [birth year]   Subjective: Dakota Gonzalez is an 75 y.o. year old male who is a primary patient of Caryl Bis, Angela Adam, MD.  The CCM team was consulted for assistance with disease management and care coordination needs.    Care coordination for  medication access  in response to provider referral for pharmacy case management and/or care coordination services.   Consent to Services:  The patient was given information about Chronic Care Management services, agreed to services, and gave verbal consent prior to initiation of services.  Please see initial visit note for detailed documentation.   Patient Care Team: [name], MD as PCP - General (Family Medicine) Wellington Hampshire, MD as Consulting Physician (Cardiology) De Hollingshead, RPH-CPP as Pharmacist (Pharmacist)  Objective:  Lab Results  Component Value Date   CREATININE 0.98 08/12/2020   CREATININE 0.94 02/09/2020   CREATININE 1.11 01/14/2020    Lab Results  Component Value Date   HGBA1C 7.1 (H) 08/12/2020   Last diabetic Eye exam:  Lab Results  Component Value Date/Time   HMDIABEYEEXA No Retinopathy 11/12/2019 12:00 AM    Last diabetic Foot exam:  Lab Results  Component Value Date/Time   HMDIABFOOTEX Normal 12/14/2013 12:00 AM        Component Value Date/Time   CHOL 113 05/04/2020 1011   TRIG 136.0 05/04/2020 1011   HDL 46.40 05/04/2020 1011   CHOLHDL 2 05/04/2020 1011   VLDL 27.2 05/04/2020 1011   LDLCALC 39 05/04/2020 1011   LDLDIRECT 70.0 08/29/2016 0940    Hepatic Function Latest Ref Rng & Units 05/04/2020 04/13/2019 04/09/2018  Total Protein 6.0 - 8.3 g/dL 6.9 7.5 7.8  Albumin 3.5 - 5.2 g/dL 4.4 4.8 4.9  AST 0 - 37 U/L '19 24 20  ' ALT 0 - 53 U/L '24 26 27  ' Alk Phosphatase 39 - 117 U/L 49 51 50  Total Bilirubin 0.2 - 1.2 mg/dL 1.1 1.1 1.3(H)  Bilirubin, Direct 0.0 - 0.3 mg/dL 0.2 - -     No results found for: TSH, FREET4  CBC Latest Ref Rng & Units 11/28/2014 11/09/2014 12/14/2013  WBC 3.8 - 10.6 K/uL 9.5 8.4 7.8  Hemoglobin 13.0 - 18.0 g/dL 15.0 15.1 14.7  Hematocrit 40.0 - 52.0 % 45.9 45.4 44.6  Platelets 150 - 440 K/uL 159 167.0 167.0    Social History   Tobacco Use  Smoking Status Former  Smokeless Tobacco Never   BP Readings from Last 3 Encounters:  08/31/20 (!) 150/80  07/15/20 (!) 146/84  05/03/20 121/64   Pulse Readings from Last 3 Encounters:  08/31/20 98  07/15/20 81  05/03/20 84   Wt Readings from Last 3 Encounters:  08/31/20 193 lb 12.8 oz (87.9 kg)  07/15/20 193 lb 8 oz (87.8 kg)  05/03/20 200 lb (90.7 kg)    Assessment: Review of patient past medical history, allergies, medications, health status, including review of consultants reports, laboratory and other test data, was performed as part of comprehensive evaluation and provision of chronic care management services.   SDOH:  (Social Determinants of Health) assessments and interventions performed:  SDOH Interventions    Flowsheet Row Most Recent Value  SDOH Interventions   Financial Strain Interventions Other (Comment)  [medication access]       CCM Care Plan  Allergies  Allergen Reactions   Aspartame Other (See Comments)    Medications Reviewed Today  Reviewed by Gordy Councilman, CMA (Certified Medical Assistant) on 08/31/20 at [place] List Status: <None>   Medication Order Taking? Sig Documenting Provider Last Dose Status Informant  aspirin 81 MG tablet [id number] Yes Take 81 mg by mouth daily. [provider] Taking Active   atorvastatin (LIPITOR) 40 MG tablet [id number] Yes TAKE 1 TABLET(40 MG) BY MOUTH DAILY [name], MD Taking Active   blood glucose meter kit and supplies [id number] Yes One touch ultra, test once daily, E11.9 [name], MD Taking Active   Blood Glucose Monitoring Suppl (ONE TOUCH ULTRA 2) w/Device KIT [id number] Yes 1  Device by Does not apply route daily as needed. [name], MD Taking Active   Coenzyme Q10 (COQ-10) 100 MG CAPS [id number] Yes Take 1 tablet by mouth daily. [provider] Taking Active   cyclobenzaprine (FLEXERIL) 10 MG tablet [id number] Yes Take 1 tablet (10 mg total) by mouth every 8 (eight) hours as needed for muscle spasms (do not drive while using, may make you drowsy). Jackolyn Confer, MD Taking Active            Med Note [facility], IVY A   Mon Apr 25, 2020  9:50 AM) Uses 1-2x/month  docusate sodium (COLACE) 100 MG capsule [id number] Yes Take 100 mg by mouth daily as needed for mild constipation. [provider] Taking Active Self           Med Note (NWOGU, IVY A   Mon Apr 25, 2020  9:50 AM) Taking QHS  empagliflozin (JARDIANCE) 25 MG TABS tablet [id number] Yes Take 1 tablet (25 mg total) by mouth daily. [name], MD Taking Active   finasteride (PROSCAR) 5 MG tablet [id number] Yes TAKE 1 TABLET(5 MG) BY MOUTH DAILY [name], MD Taking Active   glucose blood (ONE TOUCH ULTRA TEST) test strip [id number] Yes TEST TWICE A DAILY AS DIRECTED [name], MD Taking Active   Lancets (ONETOUCH DELICA PLUS [id number]) [place] [id number] Yes USE TO CHECK BLOOD SUGAR TWICE DAILY [provider] Taking Active   Lancets Misc. [place] [id number] Yes ONETOUCH DELICA LANCETS FINE MISC check blood sugar twice daily [name], MD Taking Active   lisinopril (ZESTRIL) 20 MG tablet [id number] Yes Take 0.5 tablets (10 mg total) by mouth daily. [name], MD Taking Active   metFORMIN (GLUCOPHAGE) 500 MG tablet [id number] Yes TAKE 2 TABLETS(1000 MG) BY MOUTH TWICE DAILY WITH A MEAL [name], MD Taking Active   Multiple Vitamins-Minerals (CENTRUM SILVER PO) [id number] Yes Take 1 tablet by mouth daily. [provider] Taking Active   Semaglutide (RYBELSUS) 7 MG TABS [id number] Yes Take 7 mg by mouth daily. [provider] Taking  Active   tamsulosin (FLOMAX) 0.4 MG CAPS capsule [id number] Yes TAKE 1 CAPSULE(0.4 MG) BY MOUTH DAILY [name], MD Taking Active             Patient Active Problem List   Diagnosis Date Noted   Urine frequency 04/26/2020   Adrenal adenoma, left 10/09/2017   BPH (benign prostatic hyperplasia) 06/20/2017   Onychomycosis 03/18/2017   Right knee pain 11/18/2015   Coronary atherosclerosis of native coronary artery 11/09/2014   Overweight 03/02/2013   HTN (hypertension) 02/26/2012   Incomplete emptying of bladder 02/11/2012   Nodular prostate with urinary obstruction 02/11/2012   Restless legs 01/21/2012   Diabetes mellitus type 2, controlled ([place]) 07/09/2011   Hyperlipidemia 07/09/2011   Elevated PSA 07/09/2011  Chronic back pain 07/09/2011    Immunization History  Administered Date(s) Administered   Fluad Quad(high Dose 65+) 09/26/2018   Influenza Split 10/17/2011   Influenza, High Dose Seasonal PF 09/19/2016, 10/03/2017   Influenza,inj,Quad PF,6+ Mos 09/22/2012, 10/10/2013, 11/10/2014   Influenza-Unspecified 11/03/2017, 10/20/2019, 09/06/2020   Moderna SARS-COV2 Booster Vaccination 10/16/2019   Moderna Sars-Covid-2 Vaccination 02/27/2019, 03/27/2019   Pneumococcal Conjugate-13 06/12/2013   Pneumococcal Polysaccharide-23 10/17/2011   Tdap 03/20/2017   Zoster Recombinat (Shingrix) 09/06/2020    Conditions to be addressed/monitored: HTN and DMII  Care Plan : Medication Management  Updates made by De Hollingshead, RPH-CPP since 10/27/2020 12:00 AM     Problem: Diabetes, Hypertension      Long-Range Goal: Disease Progression Prevention   Start Date: 06/01/2020  Recent Progress: On track  Priority: High  Note:   Current Barriers:  Unable to achieve control of diabetes   Pharmacist Clinical Goal(s):  Over the next 30 days, patient will verbalize ability to afford treatment regimen through collaboration with PharmD and provider.  Over the next 90 days,  patient will maintain control of diabetes as evidenced by A1c through collaboration with PharmD and provider.  Interventions: 1:1 collaboration with [name], MD regarding development and update of comprehensive plan of care as evidenced by provider attestation and co-signature Inter-disciplinary care team collaboration (see longitudinal plan of care) Comprehensive medication review performed; medication list updated in electronic medical    Diabetes: Uncontrolled, though improvement in glucose readings; current treatment: metformin 1000 mg BID, Jardiance 25 mg daily, Rybelsus 7 mg daily Enrolled in [place] for Jardiance patient assistance and [employer] for Rybelsus through [place] to follow up on status of Rybelsus refill. The medication is set to be delivered to our office around 10/21.   Hypertension: Controlled per patient report; current treatment: lisinopril 10 mg QAM  Previously recommended to continue current treatment regimen  Hyperlipidemia and ASCVD prevention with hx of CAD Controlled per last lipid panel; current treatment: atorvastatin 40 mg daily Current antiplatelet regimen: aspirin 81 mg daily Previously recommended to continue current treatment regimen  BPH: Controlled per patient symptom report; current treatment: finasteride 5 mg daily, tamsulosin 0.4 mg daily Previously recommend to continue current regimen at this time  Patient Goals/Self-Care Activities Over the next 90 days, patient will:  - take medications as prescribed collaborate with provider on medication access solutions  Follow Up Plan: Telephone follow up appointment with care management team member scheduled for: ~2 weeks as previously scheduled      Patient's preferred pharmacy is:  [facility] [place], [place] - [address] [place] [address] Phone: [phone] Fax: [phone]  Follow  Up:  Patient agrees to Care Plan and Follow-up.  Plan: Telephone follow up appointment with care management team member scheduled for:  2 weeks as previously scheduled  Catie Darnelle Maffucci, PharmD, [place], [place] Clinical Pharmacist [employer] at [facility] [phone]

## 2020-10-27 NOTE — Patient Instructions (Signed)
Visit Information  PATIENT GOALS:  Goals Addressed               This Visit's Progress     Patient Stated     Medication Management (pt-stated)        Patient Goals/Self-Care Activities Over the next 90 days, patient will - take medications as prescribed - check glucose twice daily  collaborate with provider on medication access solutions.        Patient verbalizes understanding of instructions provided today and agrees to view in [place].   Plan: Telephone follow up appointment with care management team member scheduled for:  2 weeks as previously scheduled  Catie Darnelle Maffucci, PharmD, [place], [place] Clinical Pharmacist [employer] at [facility] [phone]

## 2020-11-02 ENCOUNTER — Ambulatory Visit: Payer: Medicare Other | Admitting: Pharmacist

## 2020-11-02 DIAGNOSIS — E1165 Type 2 diabetes mellitus with hyperglycemia: Secondary | ICD-10-CM

## 2020-11-02 DIAGNOSIS — E785 Hyperlipidemia, unspecified: Secondary | ICD-10-CM

## 2020-11-02 DIAGNOSIS — I10 Essential (primary) hypertension: Secondary | ICD-10-CM

## 2020-11-02 MED ORDER — ATORVASTATIN CALCIUM 40 MG PO TABS: ORAL_TABLET | ORAL | 1 refills | Status: DC

## 2020-11-02 NOTE — Patient Instructions (Addendum)
Visit Information  PATIENT GOALS:  Goals Addressed               This Visit's Progress     Patient Stated     Medication Management (pt-stated)        Patient Goals/Self-Care Activities Over the next 90 days, patient will - take medications as prescribed - check glucose twice daily  collaborate with provider on medication access solutions.         Patient verbalizes understanding of instructions provided today and agrees to view in [place].   Plan: Telephone follow up appointment with care management team member scheduled for:  4 months   Catie Darnelle Maffucci, PharmD, [place], [place] Clinical Pharmacist [employer] at [employer] [phone]

## 2020-11-02 NOTE — Chronic Care Management (AMB) (Signed)
Chronic Care Management Pharmacy Note  11/02/2020 Name:  Dakota Gonzalez MRN:  [id number] DOB:  09/15/1945   Subjective: Dakota Gonzalez is an 75 y.o. year old male who is a primary patient of Caryl Bis, Angela Adam, MD.  The CCM team was consulted for assistance with disease management and care coordination needs.    Engaged with patient by telephone for follow up visit in response to provider referral for pharmacy case management and/or care coordination services.   Consent to Services:  The patient was given information about Chronic Care Management services, agreed to services, and gave verbal consent prior to initiation of services.  Please see initial visit note for detailed documentation.   Patient Care Team: [name], MD as PCP - General (Family Medicine) Wellington Hampshire, MD as Consulting Physician (Cardiology) De Hollingshead, RPH-CPP as Pharmacist (Pharmacist)   Objective:  Lab Results  Component Value Date   CREATININE 0.98 08/12/2020   CREATININE 0.94 02/09/2020   CREATININE 1.11 01/14/2020    Lab Results  Component Value Date   HGBA1C 7.1 (H) 08/12/2020   Last diabetic Eye exam:  Lab Results  Component Value Date/Time   HMDIABEYEEXA No Retinopathy 11/12/2019 12:00 AM    Last diabetic Foot exam:  Lab Results  Component Value Date/Time   HMDIABFOOTEX Normal 12/14/2013 12:00 AM        Component Value Date/Time   CHOL 113 05/04/2020 1011   TRIG 136.0 05/04/2020 1011   HDL 46.40 05/04/2020 1011   CHOLHDL 2 05/04/2020 1011   VLDL 27.2 05/04/2020 1011   LDLCALC 39 05/04/2020 1011   LDLDIRECT 70.0 08/29/2016 0940    Hepatic Function Latest Ref Rng & Units 05/04/2020 04/13/2019 04/09/2018  Total Protein 6.0 - 8.3 g/dL 6.9 7.5 7.8  Albumin 3.5 - 5.2 g/dL 4.4 4.8 4.9  AST 0 - 37 U/L '19 24 20  ' ALT 0 - 53 U/L '24 26 27  ' Alk Phosphatase 39 - 117 U/L 49 51 50  Total Bilirubin 0.2 - 1.2 mg/dL 1.1 1.1 1.3(H)  Bilirubin, Direct 0.0 - 0.3 mg/dL  0.2 - -    No results found for: TSH, FREET4  CBC Latest Ref Rng & Units 11/28/2014 11/09/2014 12/14/2013  WBC 3.8 - 10.6 K/uL 9.5 8.4 7.8  Hemoglobin 13.0 - 18.0 g/dL 15.0 15.1 14.7  Hematocrit 40.0 - 52.0 % 45.9 45.4 44.6  Platelets 150 - 440 K/uL 159 167.0 167.0     Social History   Tobacco Use  Smoking Status Former  Smokeless Tobacco Never   BP Readings from Last 3 Encounters:  08/31/20 (!) 150/80  07/15/20 (!) 146/84  05/03/20 121/64   Pulse Readings from Last 3 Encounters:  08/31/20 98  07/15/20 81  05/03/20 84   Wt Readings from Last 3 Encounters:  08/31/20 193 lb 12.8 oz (87.9 kg)  07/15/20 193 lb 8 oz (87.8 kg)  05/03/20 200 lb (90.7 kg)    Assessment: Review of patient past medical history, allergies, medications, health status, including review of consultants reports, laboratory and other test data, was performed as part of comprehensive evaluation and provision of chronic care management services.   SDOH:  (Social Determinants of Health) assessments and interventions performed:  SDOH Interventions    Flowsheet Row Most Recent Value  SDOH Interventions   Financial Strain Interventions Other (Comment)  [manufacturer assistance]       CCM Care Plan  Allergies  Allergen Reactions   Aspartame Other (See Comments)  Medications Reviewed Today     Reviewed by De Hollingshead, RPH-CPP (Pharmacist) on 11/02/20 at [phone]  Med List Status: <None>   Medication Order Taking? Sig Documenting Provider Last Dose Status Informant  aspirin 81 MG tablet [id number] Yes Take 81 mg by mouth daily. [provider] Taking Active   atorvastatin (LIPITOR) 40 MG tablet [id number] Yes TAKE 1 TABLET(40 MG) BY MOUTH DAILY [name], MD Taking Active   blood glucose meter kit and supplies [id number]  One touch ultra, test once daily, E11.9 [name], MD  Active   Blood Glucose Monitoring Suppl (ONE TOUCH ULTRA 2) w/Device KIT [id number]  1 Device  by Does not apply route daily as needed. [name], MD  Active   Coenzyme Q10 (COQ-10) 100 MG CAPS [id number] Yes Take 1 tablet by mouth daily. [provider] Taking Active   cyclobenzaprine (FLEXERIL) 10 MG tablet [id number] Yes Take 1 tablet (10 mg total) by mouth every 8 (eight) hours as needed for muscle spasms (do not drive while using, may make you drowsy). Jackolyn Confer, MD Taking Active            Med Note [facility], IVY A   Mon Apr 25, 2020  9:50 AM) Uses 1-2x/month  docusate sodium (COLACE) 100 MG capsule [id number] Yes Take 100 mg by mouth daily as needed for mild constipation. [provider] Taking Active Self           Med Note (NWOGU, IVY A   Mon Apr 25, 2020  9:50 AM) Taking QHS  empagliflozin (JARDIANCE) 25 MG TABS tablet [id number] Yes Take 1 tablet (25 mg total) by mouth daily. [name], MD Taking Active   finasteride (PROSCAR) 5 MG tablet [id number] Yes TAKE 1 TABLET(5 MG) BY MOUTH DAILY [name], MD Taking Active   glucose blood (ONE TOUCH ULTRA TEST) test strip [id number] Yes TEST TWICE A DAILY AS DIRECTED [name], MD Taking Active   Lancets [facility] [place] PLUS [id number]) [place] [id number] Yes USE TO CHECK BLOOD SUGAR TWICE DAILY [name], MD Taking Active   Lancets Misc. [place] [id number] Yes ONETOUCH DELICA LANCETS FINE MISC check blood sugar twice daily [name], MD Taking Active   lisinopril (ZESTRIL) 10 MG tablet [id number] Yes Take 1 tablet (10 mg total) by mouth daily. [name], MD Taking Active   metFORMIN (GLUCOPHAGE) 500 MG tablet [id number] Yes TAKE 2 TABLETS(1000 MG) BY MOUTH TWICE DAILY WITH A MEAL [name], MD Taking Active   Multiple Vitamins-Minerals (CENTRUM SILVER PO) [id number] Yes Take 1 tablet by mouth daily. [provider] Taking Active   Semaglutide (RYBELSUS) 7 MG TABS [id number] Yes Take 7 mg by mouth daily. [name], MD Taking Active   tamsulosin  [facility]) 0.4 MG CAPS capsule [id number] Yes TAKE 1 CAPSULE(0.4 MG) BY MOUTH DAILY [name], MD Taking Active             Patient Active Problem List   Diagnosis Date Noted   Urine frequency 04/26/2020   Adrenal adenoma, left 10/09/2017   BPH (benign prostatic hyperplasia) 06/20/2017   Onychomycosis 03/18/2017   Right knee pain 11/18/2015   Coronary atherosclerosis of native coronary artery 11/09/2014   Overweight 03/02/2013   HTN (hypertension) 02/26/2012   Incomplete emptying of bladder 02/11/2012   Nodular prostate with urinary obstruction 02/11/2012   Restless legs 01/21/2012   Diabetes mellitus type 2, controlled ([place]) 07/09/2011   Hyperlipidemia  07/09/2011   Elevated PSA 07/09/2011   Chronic back pain 07/09/2011    Immunization History  Administered Date(s) Administered   Fluad Quad(high Dose 65+) 09/26/2018   Influenza Split 10/17/2011   Influenza, High Dose Seasonal PF 09/19/2016, 10/03/2017   Influenza,inj,Quad PF,6+ Mos 09/22/2012, 10/10/2013, 11/10/2014   Influenza-Unspecified 11/03/2017, 10/20/2019, 09/06/2020   Moderna SARS-COV2 Booster Vaccination 10/16/2019   Moderna Sars-Covid-2 Vaccination 02/27/2019, 03/27/2019   Pneumococcal Conjugate-13 06/12/2013   Pneumococcal Polysaccharide-23 10/17/2011   Tdap 03/20/2017   Zoster Recombinat (Shingrix) 09/06/2020    Conditions to be addressed/monitored: HTN and DMII  Care Plan : Medication Management  Updates made by De Hollingshead, RPH-CPP since 11/02/2020 12:00 AM     Problem: Diabetes, Hypertension      Long-Range Goal: Disease Progression Prevention   Start Date: 06/01/2020  Recent Progress: On track  Priority: High  Note:   Current Barriers:  Unable to achieve control of diabetes   Pharmacist Clinical Goal(s):  Over the next 30 days, patient will verbalize ability to afford treatment regimen through collaboration with PharmD and provider.  Over the next 90 days, patient will  maintain control of diabetes as evidenced by A1c through collaboration with PharmD and provider.  Interventions: 1:1 collaboration with [name], MD regarding development and update of comprehensive plan of care as evidenced by provider attestation and co-signature Inter-disciplinary care team collaboration (see longitudinal plan of care) Comprehensive medication review performed; medication list updated in electronic medical   Health Maintenance   Yearly diabetic eye exam: up to date - next exam scheduled 11/2 Yearly diabetic foot exam: up to date Urine microalbumin: up to date Yearly influenza vaccination: up to date Td/Tdap vaccination: up to date Pneumonia vaccination: due COVID vaccinations: due - recommended to pursue bivalent booster, he plans to do so in the next several weeks Shingrix vaccinations: due  in a few days for second dose, he plans to get with COVID booster in the next several weeks Colonoscopy: up to date  Diabetes: Uncontrolled, though improvement in glucose readings; current treatment: metformin 1000 mg BID, Jardiance 25 mg daily, Rybelsus 7 mg daily Enrolled in [place] for Jardiance patient assistance and [employer] for Rybelsus through [place] [facility]. Denies nausea, vomiting.  Current glucose readings: fastings: 90-110s; 2 hour post prandial: <160 Recommended to continue current regimen at this time. Will collaborate w/ CPhT, patient, and providers to reapply for patient assistance for Rybelsus and Jardiance for 2023. Encouraged to continue to check BG readings BID.   Hypertension: Controlled per patient report; current treatment: lisinopril 10 mg QAM  Has previously compared home BP cuff and was accurated Home BP readings: this morning was 121/70, generally lower 100-105s/60s in the afternoons Denies episodes of hypotension or symptoms Recommended to continue current treatment regimen  Hyperlipidemia and ASCVD prevention with hx  of CAD Controlled per last lipid panel; current treatment: atorvastatin 40 mg daily Current antiplatelet regimen: aspirin 81 mg daily Recommended to continue current treatment regimen  BPH: Controlled per patient symptom report; current treatment: finasteride 5 mg daily, tamsulosin 0.4 mg daily Recommend to continue current regimen at this time  Patient Goals/Self-Care Activities Over the next 90 days, patient will:  - take medications as prescribed collaborate with provider on medication access solutions  Follow Up Plan: Telephone follow up appointment with care management team member scheduled for: 4 months      Medication Assistance:  Jardiance, Rybelsus obtained through [place] medication assistance program.  Enrollment ends 01/14/21;  12/14/20  Patient's preferred pharmacy is:  [id number] - [address], [place] AT [facility] OF [address] [place] [address] Phone: [phone] Fax: [phone]    Follow Up:  Patient agrees to Care Plan and Follow-up.  Plan: Telephone follow up appointment with care management team member scheduled for:  4 months  Catie Darnelle Maffucci, PharmD, [place], [place] Clinical Pharmacist [employer] at [employer] [phone]

## 2020-11-06 ENCOUNTER — Encounter: Payer: Self-pay | Admitting: Family Medicine

## 2020-11-09 ENCOUNTER — Encounter: Payer: Self-pay | Admitting: Family Medicine

## 2020-11-10 ENCOUNTER — Ambulatory Visit: Payer: Medicare Other | Admitting: Pharmacist

## 2020-11-10 DIAGNOSIS — E1165 Type 2 diabetes mellitus with hyperglycemia: Secondary | ICD-10-CM

## 2020-11-10 DIAGNOSIS — E785 Hyperlipidemia, unspecified: Secondary | ICD-10-CM

## 2020-11-10 NOTE — Patient Instructions (Signed)
Visit Information  PATIENT GOALS:  Goals Addressed               This Visit's Progress     Patient Stated     Medication Management (pt-stated)        Patient Goals/Self-Care Activities Over the next 90 days, patient will - take medications as prescribed - check glucose twice daily  collaborate with provider on medication access solutions.        Patient verbalizes understanding of instructions provided today and agrees to view in [place].   Plan: Telephone follow up appointment with care management team member scheduled for:  4 months  Catie Darnelle Maffucci, PharmD, [place], [place] Clinical Pharmacist [employer] at [employer] [phone]

## 2020-11-10 NOTE — Chronic Care Management (AMB) (Signed)
Chronic Care Management Pharmacy Note  11/10/2020 Name:  Dakota Gonzalez MRN:  [id number] DOB:  03-Sep-1945  Subjective: Dakota Gonzalez is an 75 y.o. year old male who is a primary patient of Caryl Bis, Angela Adam, MD.  The CCM team was consulted for assistance with disease management and care coordination needs.    Engaged with patient by telephone for follow up visit for pharmacy case management and/or care coordination services.   Objective:  Medications Reviewed Today     Reviewed by De Hollingshead, RPH-CPP (Pharmacist) on 11/02/20 at [phone]  Med List Status: <None>   Medication Order Taking? Sig Documenting Provider Last Dose Status Informant  aspirin 81 MG tablet [id number] Yes Take 81 mg by mouth daily. [provider] Taking Active   atorvastatin (LIPITOR) 40 MG tablet [id number] Yes TAKE 1 TABLET(40 MG) BY MOUTH DAILY [name], MD Taking Active   blood glucose meter kit and supplies [id number]  One touch ultra, test once daily, E11.9 [name], MD  Active   Blood Glucose Monitoring Suppl (ONE TOUCH ULTRA 2) w/Device KIT [id number]  1 Device by Does not apply route daily as needed. [name], MD  Active   Coenzyme Q10 (COQ-10) 100 MG CAPS [id number] Yes Take 1 tablet by mouth daily. [provider] Taking Active   cyclobenzaprine (FLEXERIL) 10 MG tablet [id number] Yes Take 1 tablet (10 mg total) by mouth every 8 (eight) hours as needed for muscle spasms (do not drive while using, may make you drowsy). Jackolyn Confer, MD Taking Active            Med Note [facility], IVY A   Mon Apr 25, 2020  9:50 AM) Uses 1-2x/month  docusate sodium (COLACE) 100 MG capsule [id number] Yes Take 100 mg by mouth daily as needed for mild constipation. [provider] Taking Active Self           Med Note (NWOGU, IVY A   Mon Apr 25, 2020  9:50 AM) Taking QHS  empagliflozin (JARDIANCE) 25 MG TABS tablet [id number] Yes Take 1 tablet (25 mg total) by mouth  daily. [name], MD Taking Active   finasteride (PROSCAR) 5 MG tablet [id number] Yes TAKE 1 TABLET(5 MG) BY MOUTH DAILY [name], MD Taking Active   glucose blood (ONE TOUCH ULTRA TEST) test strip [id number] Yes TEST TWICE A DAILY AS DIRECTED [name], MD Taking Active   Lancets [facility] PLUS [id number]) [place] [id number] Yes USE TO CHECK BLOOD SUGAR TWICE DAILY [name], MD Taking Active   Lancets Misc. [place] [id number] Yes ONETOUCH DELICA LANCETS FINE MISC check blood sugar twice daily [name], MD Taking Active   lisinopril (ZESTRIL) 10 MG tablet [id number] Yes Take 1 tablet (10 mg total) by mouth daily. [name], MD Taking Active   metFORMIN (GLUCOPHAGE) 500 MG tablet [id number] Yes TAKE 2 TABLETS(1000 MG) BY MOUTH TWICE DAILY WITH A MEAL [name], MD Taking Active   Multiple Vitamins-Minerals (CENTRUM SILVER PO) [id number] Yes Take 1 tablet by mouth daily. [provider] Taking Active   Semaglutide (RYBELSUS) 7 MG TABS [id number] Yes Take 7 mg by mouth daily. [name], MD Taking Active   tamsulosin [facility]) 0.4 MG CAPS capsule [id number] Yes TAKE 1 CAPSULE(0.4 MG) BY MOUTH DAILY [name], MD Taking Active              Assessment/Interventions: Review of patient past medical history,  allergies, medications, health status, including review of consultants reports, laboratory and other test data, was performed as part of comprehensive evaluation and provision of chronic care management services.   SDOH:  (Social Determinants of Health) assessments and interventions performed: Yes SDOH Interventions    Flowsheet Row Most Recent Value  SDOH Interventions   Financial Strain Interventions Other (Comment)  [manufacturer assistance]        CCM Care Plan  Review of patient past medical history, allergies, medications, health status, including review of consultants reports, laboratory and other  test data, was performed as part of comprehensive evaluation and provision of chronic care management services.   Conditions to be addressed/monitored:  Hypertension and Diabetes  Care Plan : Medication Management  Updates made by De Hollingshead, RPH-CPP since 11/10/2020 12:00 AM     Problem: Diabetes, Hypertension      Long-Range Goal: Disease Progression Prevention   Start Date: 06/01/2020  This Visit's Progress: On track  Recent Progress: On track  Priority: High  Note:   Current Barriers:  Unable to achieve control of diabetes   Pharmacist Clinical Goal(s):  Over the next 30 days, patient will verbalize ability to afford treatment regimen through collaboration with PharmD and provider.  Over the next 90 days, patient will maintain control of diabetes as evidenced by A1c through collaboration with PharmD and provider.  Interventions: 1:1 collaboration with [name], MD regarding development and update of comprehensive plan of care as evidenced by provider attestation and co-signature Inter-disciplinary care team collaboration (see longitudinal plan of care) Comprehensive medication review performed; medication list updated in electronic medical   Health Maintenance   Yearly diabetic eye exam: up to date - next exam scheduled 11/2 Yearly diabetic foot exam: up to date Urine microalbumin: up to date Yearly influenza vaccination: up to date Td/Tdap vaccination: up to date Pneumonia vaccination: due COVID vaccinations: due - recommended to pursue bivalent booster, he plans to do so in the next several weeks Shingrix vaccinations: due  in a few days for second dose, he plans to get with COVID booster in the next several weeks Colonoscopy: up to date  Diabetes: Uncontrolled, though improvement in glucose readings; current treatment: metformin 1000 mg BID, Jardiance 25 mg daily, Rybelsus 7 mg daily Enrolled in [place] for Jardiance patient assistance and [employer] for Rybelsus through 2022 Awaiting shipment of Rybelsus 7 mg. Should arrive today or tomorrow. Patient confirmed with [employer]. They provided phone number for voucher if order does not arrive in the next 2 days. He has 3 mg tabs, he will take 2 to use that supply.  Recommended to continue current regimen at this time. Will collaborate w/ CPhT, patient, and providers to reapply for patient assistance for Rybelsus and Jardiance for 2023.  Hypertension: Controlled per patient report; current treatment: lisinopril 10 mg QAM  Previously recommended to continue current treatment regimen  Hyperlipidemia and ASCVD prevention with hx of CAD Controlled per last lipid panel; current treatment: atorvastatin 40 mg daily Current antiplatelet regimen: aspirin 81 mg daily Previously recommended to continue current treatment regimen  BPH: Controlled per patient symptom report; current treatment: finasteride 5 mg daily, tamsulosin 0.4 mg daily Recommend to continue current regimen at this time  Patient Goals/Self-Care Activities Over the next 90 days, patient will:  - take medications as prescribed collaborate with provider on medication access solutions  Follow Up Plan: Telephone follow up appointment with care management team member scheduled for: 4 months  Plan: Telephone follow up appointment with care management team member scheduled for:  4 months  Catie Darnelle Maffucci, PharmD, [place], [place] Clinical Pharmacist [employer] at [employer] [phone]

## 2020-11-10 NOTE — Telephone Encounter (Signed)
Patient is returning your call about his Rybelsus.Please call him at [phone].

## 2020-11-14 ENCOUNTER — Telehealth: Payer: Self-pay | Admitting: Family Medicine

## 2020-11-14 DIAGNOSIS — E1165 Type 2 diabetes mellitus with hyperglycemia: Secondary | ICD-10-CM | POA: Diagnosis not present

## 2020-11-14 DIAGNOSIS — E785 Hyperlipidemia, unspecified: Secondary | ICD-10-CM | POA: Diagnosis not present

## 2020-11-14 DIAGNOSIS — E1159 Type 2 diabetes mellitus with other circulatory complications: Secondary | ICD-10-CM

## 2020-11-14 DIAGNOSIS — I10 Essential (primary) hypertension: Secondary | ICD-10-CM | POA: Diagnosis not present

## 2020-11-14 MED ORDER — RYBELSUS 7 MG PO TABS
7.0000 mg | ORAL_TABLET | Freq: Every day | ORAL | 0 refills | Status: DC
Start: 2020-11-14 — End: 2021-02-09

## 2020-11-14 NOTE — Telephone Encounter (Signed)
Script sent to [facility] per patient request

## 2020-11-14 NOTE — Telephone Encounter (Signed)
Patient called wanting to know when shipment of Rybelsus. Since shipment hasn't come in he is needing a prescription sent to his pharmacy. [facility] in [place]. Patient will also be in office tomorrow morning for appointment.

## 2020-11-15 ENCOUNTER — Telehealth: Payer: Self-pay | Admitting: Pharmacy Technician

## 2020-11-15 ENCOUNTER — Ambulatory Visit (INDEPENDENT_AMBULATORY_CARE_PROVIDER_SITE_OTHER): Payer: Medicare Other | Admitting: Family Medicine

## 2020-11-15 ENCOUNTER — Other Ambulatory Visit: Payer: Self-pay

## 2020-11-15 ENCOUNTER — Encounter: Payer: Self-pay | Admitting: Family Medicine

## 2020-11-15 DIAGNOSIS — E785 Hyperlipidemia, unspecified: Secondary | ICD-10-CM

## 2020-11-15 DIAGNOSIS — I10 Essential (primary) hypertension: Secondary | ICD-10-CM

## 2020-11-15 DIAGNOSIS — E1159 Type 2 diabetes mellitus with other circulatory complications: Secondary | ICD-10-CM

## 2020-11-15 DIAGNOSIS — Z596 Low income: Secondary | ICD-10-CM

## 2020-11-15 LAB — BASIC METABOLIC PANEL
BUN: 14 mg/dL (ref 6–23)
CO2: 30 mEq/L (ref 19–32)
Calcium: 10.1 mg/dL (ref 8.4–10.5)
Chloride: 100 mEq/L (ref 96–112)
Creatinine, Ser: 0.92 mg/dL (ref 0.40–1.50)
GFR: 82.02 mL/min (ref >60.00)
Glucose, Bld: 114 mg/dL — ABNORMAL HIGH (ref 70–99)
Potassium: 4.2 mEq/L (ref 3.5–5.1)
Sodium: 137 mEq/L (ref 135–145)

## 2020-11-15 LAB — HEMOGLOBIN A1C: Hgb A1c MFr Bld: 6.8 % — ABNORMAL HIGH (ref 4.6–6.5)

## 2020-11-15 NOTE — Patient Instructions (Signed)
Nice to see you. We will check lab work today and contact you with the results. I will see back in 6 months.

## 2020-11-15 NOTE — Assessment & Plan Note (Signed)
Seems to be well controlled.  We will check an A1c today.  He will continue Jardiance 25 mg once daily, metformin 1000 mg twice daily, and Rybelsus 7 mg daily.

## 2020-11-15 NOTE — Progress Notes (Signed)
[facility])                                            [place] Team    11/15/2020  Dakota Gonzalez Feb 17, 1945 [id number]                                      Medication Assistance Referral  Referral From: [facility] Embedded RPh Catie T.   Medication/Company: Vania Rea / [employer] Patient application portion:  N/A brought to clinic signed Provider application portion: Faxed  to Dr. Caryl Bis Provider address/fax verified via: Office website  Medication/Company: Alveda Reasons / [employer] Patient application portion:  Education officer, museum portion: Faxed  to Dr. Caryl Bis Provider address/fax verified via: Office website  Received both patient and provider portion(s) of patient assistance application(s) for [employer]. Faxed completed application and required documents into [employer] for 2023.    [name] [name], [place]  [phone]

## 2020-11-15 NOTE — Assessment & Plan Note (Signed)
Well-controlled.  He will continue to monitor his blood pressure at home.  He will continue lisinopril 10 mg once daily.  Check BMP.

## 2020-11-15 NOTE — Assessment & Plan Note (Signed)
Continue Lipitor 40 mg once daily.  This was well controlled on last check.

## 2020-11-15 NOTE — Progress Notes (Signed)
[name], MD Phone: [phone]  Dakota Gonzalez is a 75 y.o. male who presents today for f/u.  DIABETES Disease Monitoring: Blood Sugar ranges-highest has been 130, typically 100-110 Polyuria/phagia/dipsia- no      Optho- see's tomorrow Medications: Compliance- taking jardiance, metformin, rybelsus Hypoglycemic symptoms- no, he did have a 60 recently though no symptoms and he questions if it was an accurate reading  HYPERTENSION Disease Monitoring Home BP Monitoring 100-105/60-62 Chest pain- no    Dyspnea- no Medications Compliance-  taking lisinopril.   Edema- no BMET    Component Value Date/Time   NA 137 08/12/2020 0757   NA 139 12/15/2012 0806   K 4.2 08/12/2020 0757   K 4.1 12/15/2012 0806   CL 101 08/12/2020 0757   CL 107 12/15/2012 0806   CO2 27 08/12/2020 0757   CO2 28 12/15/2012 0806   GLUCOSE 120 (H) 08/12/2020 0757   GLUCOSE 138 (H) 12/15/2012 0806   BUN 15 08/12/2020 0757   BUN 11 12/15/2012 0806   CREATININE 0.98 08/12/2020 0757   CREATININE 1.11 01/14/2020 1422   CALCIUM 9.9 08/12/2020 0757   CALCIUM 9.5 12/15/2012 0806   GFRNONAA >60 11/28/2014 1135   GFRNONAA >60 12/15/2012 0806   GFRAA >60 11/28/2014 1135   GFRAA >60 12/15/2012 0806   HYPERLIPIDEMIA Symptoms Chest pain on exertion:  no   Medications: Compliance- taking lipitor Right upper quadrant pain- no  Muscle aches- no Lipid Panel     Component Value Date/Time   CHOL 113 05/04/2020 1011   TRIG 136.0 05/04/2020 1011   HDL 46.40 05/04/2020 1011   CHOLHDL 2 05/04/2020 1011   VLDL 27.2 05/04/2020 1011   LDLCALC 39 05/04/2020 1011   LDLDIRECT 70.0 08/29/2016 0940      Social History   Tobacco Use  Smoking Status Former  Smokeless Tobacco Never    Current Outpatient Medications on File Prior to Visit  Medication Sig Dispense Refill   aspirin 81 MG tablet Take 81 mg by mouth daily.     atorvastatin (LIPITOR) 40 MG tablet TAKE 1 TABLET(40 MG) BY MOUTH DAILY 90 tablet 1    blood glucose meter kit and supplies One touch ultra, test once daily, E11.9 1 each 0   Blood Glucose Monitoring Suppl (ONE TOUCH ULTRA 2) w/Device KIT 1 Device by Does not apply route daily as needed. 1 kit 0   Coenzyme Q10 (COQ-10) 100 MG CAPS Take 1 tablet by mouth daily.     cyclobenzaprine (FLEXERIL) 10 MG tablet Take 1 tablet (10 mg total) by mouth every 8 (eight) hours as needed for muscle spasms (do not drive while using, may make you drowsy). 60 tablet 1   docusate sodium (COLACE) 100 MG capsule Take 100 mg by mouth daily as needed for mild constipation.     empagliflozin (JARDIANCE) 25 MG TABS tablet Take 1 tablet (25 mg total) by mouth daily. 90 tablet 3   finasteride (PROSCAR) 5 MG tablet TAKE 1 TABLET(5 MG) BY MOUTH DAILY 90 tablet 1   glucose blood (ONE TOUCH ULTRA TEST) test strip TEST TWICE A DAILY AS DIRECTED 200 each 5   Lancets (ONETOUCH DELICA PLUS [id number]) MISC USE TO CHECK BLOOD SUGAR TWICE DAILY 200 each 5   Lancets Misc. MISC ONETOUCH DELICA LANCETS FINE MISC check blood sugar twice daily 200 each 5   lisinopril (ZESTRIL) 10 MG tablet Take 1 tablet (10 mg total) by mouth daily. 90 tablet 3   metFORMIN (GLUCOPHAGE) 500 MG tablet TAKE  2 TABLETS(1000 MG) BY MOUTH TWICE DAILY WITH A MEAL 360 tablet 3   Multiple Vitamins-Minerals (CENTRUM SILVER PO) Take 1 tablet by mouth daily.     Semaglutide (RYBELSUS) 7 MG TABS Take 7 mg by mouth daily. 30 tablet 0   tamsulosin (FLOMAX) 0.4 MG CAPS capsule TAKE 1 CAPSULE(0.4 MG) BY MOUTH DAILY 90 capsule 3   No current facility-administered medications on file prior to visit.     ROS see history of present illness  Objective  Physical Exam Vitals:   11/15/20 0909  BP: 130/80  Pulse: 91  Temp: 98.6 F (37 C)  SpO2: 98%    BP Readings from Last 3 Encounters:  11/15/20 130/80  08/31/20 (!) 150/80  07/15/20 (!) 146/84   Wt Readings from Last 3 Encounters:  11/15/20 192 lb (87.1 kg)  08/31/20 193 lb 12.8 oz (87.9 kg)   07/15/20 193 lb 8 oz (87.8 kg)    Physical Exam Constitutional:      General: He is not in acute distress.    Appearance: He is not diaphoretic.  Cardiovascular:     Rate and Rhythm: Normal rate and regular rhythm.     Heart sounds: Normal heart sounds.  Pulmonary:     Effort: Pulmonary effort is normal.     Breath sounds: Normal breath sounds.  Musculoskeletal:     Right lower leg: No edema.     Left lower leg: No edema.  Skin:    General: Skin is warm and dry.  Neurological:     Mental Status: He is alert.     Assessment/Plan: Please see individual problem list.  Problem List Items Addressed This Visit     Diabetes mellitus type 2, controlled ([place]) (Chronic)    Seems to be well controlled.  We will check an A1c today.  He will continue Jardiance 25 mg once daily, metformin 1000 mg twice daily, and Rybelsus 7 mg daily.      Relevant Orders   HgB A1c   HTN (hypertension) (Chronic)    Well-controlled.  He will continue to monitor his blood pressure at home.  He will continue lisinopril 10 mg once daily.  Check BMP.      Relevant Orders   Basic Metabolic Panel (BMET)   Hyperlipidemia (Chronic)    Continue Lipitor 40 mg once daily.  This was well controlled on last check.        Health Maintenance: He will see ophthalmology tomorrow.  Return in about 6 months (around 05/15/2021) for Diabetes, hypertension, hyperlipidemia.  This visit occurred during the SARS-CoV-2 public health emergency.  Safety protocols were in place, including screening questions prior to the visit, additional usage of staff PPE, and extensive cleaning of exam room while observing appropriate contact time as indicated for disinfecting solutions.    [name], MD [place]

## 2020-11-16 DIAGNOSIS — E119 Type 2 diabetes mellitus without complications: Secondary | ICD-10-CM | POA: Diagnosis not present

## 2020-11-16 LAB — HM DIABETES EYE EXAM

## 2020-11-18 ENCOUNTER — Encounter: Payer: Self-pay | Admitting: Family Medicine

## 2020-11-25 ENCOUNTER — Telehealth: Payer: Self-pay

## 2020-11-25 NOTE — Telephone Encounter (Signed)
I called and informed the patient that his Rybelsus has arrived and is available for pickup and he understood.  Jared Whorley,cma

## 2020-12-02 ENCOUNTER — Encounter: Payer: Self-pay | Admitting: Family Medicine

## 2020-12-06 ENCOUNTER — Telehealth: Payer: Self-pay | Admitting: Pharmacy Technician

## 2020-12-06 DIAGNOSIS — Z596 Low income: Secondary | ICD-10-CM

## 2020-12-06 NOTE — Progress Notes (Signed)
[facility])                                            [place] Team    12/06/2020  Dakota Gonzalez [birth year] [id number]  Received both patient and provider portion(s) of patient assistance application(s) for Rybelsus. Faxed completed application and required documents into [employer].   Dakota Gonzalez, [name]  [phone]

## 2020-12-16 ENCOUNTER — Telehealth: Payer: Self-pay | Admitting: Pharmacy Technician

## 2020-12-16 DIAGNOSIS — Z596 Low income: Secondary | ICD-10-CM

## 2020-12-16 NOTE — Progress Notes (Signed)
[facility])                                            [place] Team    12/16/2020  IRFAN VEAL [birth year] [id number]  Care coordination call placed to [place] in regard to [place] determination for 2023.  Spoke to [place] who informs patient was APPROVED for 2023 effective 01/15/2021-01/14/2022.  She informs the patient was due for a refill and she informs she would put that in motion today. She informs patient should receive it in the next 7-15 business days.   She also informs that the refill procedure would be the same for 2023 which will require the patient to call the company to re order the medication.  In basket message sent to embedded PharmD as Juluis Rainier to notify patient of his approval at next [facility] office visit.  Grayer Sproles P. Jediah Horger, [place]  [phone]

## 2020-12-29 ENCOUNTER — Telehealth: Payer: Self-pay | Admitting: Pharmacy Technician

## 2020-12-29 ENCOUNTER — Other Ambulatory Visit: Payer: Self-pay | Admitting: Family Medicine

## 2020-12-29 DIAGNOSIS — Z596 Low income: Secondary | ICD-10-CM

## 2020-12-29 NOTE — Progress Notes (Signed)
[facility])                                            [place] Team    12/29/2020  Dakota Gonzalez [birth year] [id number]    Care coordination call placed to [place] in regard to [id number] renewal application for Rybelsus.   Spoke to [place] who informs patient has been APPROVED 01/15/2021-01/14/2022. Medication will being to ship in January 2023 based on last fill date in 2022.  [name] P. Jaiya Mooradian, [place]  [phone]

## 2021-01-16 ENCOUNTER — Encounter: Payer: Self-pay | Admitting: Family Medicine

## 2021-01-24 ENCOUNTER — Encounter: Payer: Self-pay | Admitting: Family Medicine

## 2021-02-03 ENCOUNTER — Other Ambulatory Visit: Payer: Self-pay

## 2021-02-03 ENCOUNTER — Encounter: Payer: Self-pay | Admitting: Family Medicine

## 2021-02-03 DIAGNOSIS — E785 Hyperlipidemia, unspecified: Secondary | ICD-10-CM

## 2021-02-03 MED ORDER — ATORVASTATIN CALCIUM 40 MG PO TABS
ORAL_TABLET | ORAL | 1 refills | Status: DC
Start: 2021-02-03 — End: 2021-07-31

## 2021-02-09 ENCOUNTER — Telehealth: Payer: Self-pay | Admitting: Pharmacist

## 2021-02-09 ENCOUNTER — Encounter: Payer: Self-pay | Admitting: Family Medicine

## 2021-02-09 ENCOUNTER — Other Ambulatory Visit: Payer: Self-pay

## 2021-02-09 DIAGNOSIS — E1159 Type 2 diabetes mellitus with other circulatory complications: Secondary | ICD-10-CM

## 2021-02-09 MED ORDER — RYBELSUS 7 MG PO TABS: 7.0000 mg | ORAL_TABLET | Freq: Every day | ORAL | 0 refills | Status: DC

## 2021-02-09 NOTE — Telephone Encounter (Signed)
Patient called in regards to medication Rybellsus. Patient states he typically picks up his prescriptions from [facility] due to the financial assistance program. Patient has spoke with the company and received a voucher number for his medication   Voucher number: [id number]  Patient states he only has four days left of current medication. Please give patient a call back.

## 2021-02-09 NOTE — Telephone Encounter (Signed)
Sent refill on Rybelsus to [facility] for patient to utilize voucher information that was provided by [employer], per his call. Notified patient

## 2021-02-15 ENCOUNTER — Telehealth: Payer: Self-pay | Admitting: Pharmacist

## 2021-02-15 ENCOUNTER — Ambulatory Visit (INDEPENDENT_AMBULATORY_CARE_PROVIDER_SITE_OTHER): Payer: Medicare Other | Admitting: Pharmacist

## 2021-02-15 DIAGNOSIS — I10 Essential (primary) hypertension: Secondary | ICD-10-CM

## 2021-02-15 DIAGNOSIS — I2510 Atherosclerotic heart disease of native coronary artery without angina pectoris: Secondary | ICD-10-CM

## 2021-02-15 DIAGNOSIS — E1159 Type 2 diabetes mellitus with other circulatory complications: Secondary | ICD-10-CM

## 2021-02-15 DIAGNOSIS — E785 Hyperlipidemia, unspecified: Secondary | ICD-10-CM

## 2021-02-15 MED ORDER — CYCLOBENZAPRINE HCL 10 MG PO TABS: 10.0000 mg | ORAL_TABLET | Freq: Three times a day (TID) | ORAL | 0 refills | Status: AC | PRN

## 2021-02-15 MED ORDER — METFORMIN HCL 500 MG PO TABS: ORAL_TABLET | ORAL | 3 refills | Status: DC

## 2021-02-15 NOTE — Chronic Care Management (AMB) (Signed)
Chronic Care Management CCM Pharmacy Note  02/15/2021 Name:  Dakota Gonzalez MRN:  [id number] DOB:  1945-06-16  Summary: - Tolerating regimen well  - Approved for patient assistance for Rybelsus and Jardiance for 2023.   Recommendations/Changes made from today's visit: - Recommended to continue current regimen at this time  Subjective: Dakota Gonzalez is an 76 y.o. year old male who is a primary patient of Caryl Bis, Angela Adam, MD.  The CCM team was consulted for assistance with disease management and care coordination needs.    Engaged with patient by telephone for follow up visit for pharmacy case management and/or care coordination services.   Objective:  Medications Reviewed Today     Reviewed by De Hollingshead, RPH-CPP (Pharmacist) on 02/15/21 at [phone]  Med List Status: <None>   Medication Order Taking? Sig Documenting Provider Last Dose Status Informant  aspirin 81 MG tablet [id number] Yes Take 81 mg by mouth daily. [provider] Taking Active   atorvastatin (LIPITOR) 40 MG tablet [id number] Yes TAKE 1 TABLET(40 MG) BY MOUTH DAILY [name], MD Taking Active   blood glucose meter kit and supplies [id number] Yes One touch ultra, test once daily, E11.9 [name], MD Taking Active   Blood Glucose Monitoring Suppl (ONE TOUCH ULTRA 2) w/Device KIT [id number] Yes 1 Device by Does not apply route daily as needed. [name], MD Taking Active   Coenzyme Q10 (COQ-10) 100 MG CAPS [id number] Yes Take 1 tablet by mouth daily. [provider] Taking Active   cyclobenzaprine (FLEXERIL) 10 MG tablet [id number] Yes Take 1 tablet (10 mg total) by mouth every 8 (eight) hours as needed for muscle spasms (do not drive while using, may make you drowsy). Jackolyn Confer, MD Taking Active            Med Note [facility], [facility]   Mon Apr 25, 2020  9:50 AM) Uses 1-2x/month  docusate sodium (COLACE) 100 MG capsule [id number] Yes Take 100 mg by mouth daily as needed  for mild constipation. [provider] Taking Active Self           Med Note ([facility], IVY A   Mon Apr 25, 2020  9:50 AM) Taking QHS  empagliflozin (JARDIANCE) 25 MG TABS tablet [id number] Yes Take 1 tablet (25 mg total) by mouth daily. [name], MD Taking Active   finasteride (PROSCAR) 5 MG tablet [id number] Yes TAKE 1 TABLET(5 MG) BY MOUTH DAILY [name], MD Taking Active   glucose blood (ONE TOUCH ULTRA TEST) test strip [id number]  TEST TWICE A DAILY AS DIRECTED [name], MD  Active   Lancets (ONETOUCH DELICA PLUS [id number]) [place] [id number]  USE TO CHECK BLOOD SUGAR TWICE DAILY [name], MD  Active   Lancets Misc. MISC [id number]  ONETOUCH DELICA LANCETS FINE MISC check blood sugar twice daily [name], MD  Active   lisinopril (ZESTRIL) 10 MG tablet [id number] Yes Take 1 tablet (10 mg total) by mouth daily. [name], MD Taking Active   metFORMIN (GLUCOPHAGE) 500 MG tablet [id number] Yes TAKE 2 TABLETS(1000 MG) BY MOUTH TWICE DAILY WITH A MEAL [name], MD Taking Active   Multiple Vitamins-Minerals (CENTRUM SILVER PO) [id number] Yes Take 1 tablet by mouth daily. [provider] Taking Active   Semaglutide (RYBELSUS) 7 MG TABS [id number] Yes Take 7 mg by mouth daily. [name], MD Taking Active   tamsulosin (FLOMAX) 0.4 MG CAPS capsule  [id number] Yes TAKE 1 CAPSULE(0.4 MG) BY MOUTH DAILY [name], MD Taking Active             Pertinent Labs:   Lab Results  Component Value Date   HGBA1C 6.8 (H) 11/15/2020   Lab Results  Component Value Date   CHOL 113 05/04/2020   HDL 46.40 05/04/2020   LDLCALC 39 05/04/2020   LDLDIRECT 70.0 08/29/2016   TRIG 136.0 05/04/2020   CHOLHDL 2 05/04/2020   Lab Results  Component Value Date   CREATININE 0.92 11/15/2020   BUN 14 11/15/2020   NA 137 11/15/2020   K 4.2 11/15/2020   CL 100 11/15/2020   CO2 30 11/15/2020    SDOH:  (Social Determinants of  Health) assessments and interventions performed:  SDOH Interventions    Flowsheet Row Most Recent Value  SDOH Interventions   Food Insecurity Interventions Intervention Not Indicated  Financial Strain Interventions Other (Comment)  [manufacturer assistance]  Physical Activity Interventions Intervention Not Indicated       CCM Care Plan  Review of patient past medical history, allergies, medications, health status, including review of consultants reports, laboratory and other test data, was performed as part of comprehensive evaluation and provision of chronic care management services.   Care Plan : Medication Management  Updates made by De Hollingshead, RPH-CPP since 02/15/2021 12:00 AM     Problem: Diabetes, Hypertension      Long-Range Goal: Disease Progression Prevention   Start Date: 06/01/2020  Recent Progress: On track  Priority: High  Note:   Current Barriers:  Unable to independently afford treatment regimen  Pharmacist Clinical Goal(s):  Over the next 30 days, patient will verbalize ability to afford treatment regimen through collaboration with PharmD and provider.  Over the next 90 days, patient will maintain control of diabetes as evidenced by A1c through collaboration with PharmD and provider.  Interventions: 1:1 collaboration with [name], MD regarding development and update of comprehensive plan of care as evidenced by provider attestation and co-signature Inter-disciplinary care team collaboration (see longitudinal plan of care) Comprehensive medication review performed; medication list updated in electronic medical   Health Maintenance   Yearly diabetic eye exam: up to date  Yearly diabetic foot exam: up to date Urine microalbumin: up to date Yearly influenza vaccination: up to date Td/Tdap vaccination: up to date Pneumonia vaccination: up to date COVID vaccinations: up to date - added to chart today Shingrix vaccinations: up to date   Colonoscopy: up to date  Diabetes: Controlled; current treatment: metformin 1000 mg BID, Jardiance 25 mg daily, Rybelsus 7 mg daily Enrolled in [place] for Jardiance patient assistance and [employer] for Rybelsus through 2023, awaiting Rybelsus shipment but was given voucher  Current readings: fastings/pre meal: 80-100s; post prandial: 120-140s Continues to exercise daily. Thinking of re-joining [employer] to increase physical activity.  Continues to focus on low sugar/carb meals.  Praised for continued glucose control. Recommended to continue current regimen at this time.   Hypertension: Controlled per patient report; current treatment: lisinopril 10 mg daily Home BP readings: ~110-120s/70s Reviewed goal BP <130/80. Praised for continued control Recommended to continue current treatment regimen  Hyperlipidemia and ASCVD prevention with hx of CAD Controlled per last lipid panel; current treatment: atorvastatin 40 mg daily Current antiplatelet regimen: aspirin 81 mg daily Reviewed goal LDL <55 given DM + ASCVD.  Recommended to continue current treatment regimen  BPH: Controlled per patient symptom report; current treatment: finasteride 5 mg daily, tamsulosin  0.4 mg daily Recommend to continue current regimen at this time  Patient Goals/Self-Care Activities Over the next 90 days, patient will:  - take medications as prescribed collaborate with provider on medication access solutions       Plan: Telephone follow up appointment with care management team member scheduled for:  6 months  Catie Darnelle Maffucci, PharmD, [place], [place] Pharmacist [employer] at [employer] [phone]

## 2021-02-15 NOTE — Telephone Encounter (Signed)
Called patient for CCM visit today. He requests refill on cyclobenzaprine. Was originally prescribed by Dr. Gilford Rile, he uses ~1-2 doses per month for back spasms.   Routing to PCP.

## 2021-02-15 NOTE — Addendum Note (Signed)
Addended by: Caryl Bis, Samamtha Tiegs G on: 02/15/2021 12:06 PM   Modules accepted: Orders

## 2021-02-15 NOTE — Telephone Encounter (Signed)
Refill sent to pharmacy.  Plan to discuss future use of this at his next visit.

## 2021-02-15 NOTE — Patient Instructions (Signed)
Arran,   Keep up the great work!  As you know, the [employer] Patient Assistance Program (Rybelsus) has an auto-refill program where you do not need to call and request a refill each time. You should receive your medication shipment before you run out of your medication. However, if you have any issues or need assistance, you can call them at [phone]. They are available Monday though Friday 8:00 AM - 8:00 PM.   For prescription refills through the [facility] Patient [facility]), call [phone]. Once you have been enrolled in the program, your prescriptions can easily be refilled by contacting the phone number above Monday though Friday 8:30 AM - 6:00 PM. Please call them to request the refill when you have about 2 weeks remaining of medication.  Take care!  Catie Darnelle Maffucci, PharmD  Visit Information  Following are the goals we discussed today:  Patient Goals/Self-Care Activities Over the next 90 days, patient will:  - take medications as prescribed collaborate with provider on medication access solutions          Plan: Telephone follow up appointment with care management team member scheduled for:  6 months   Catie Darnelle Maffucci, PharmD, [place], CPP Clinical Pharmacist [place] at [facility] [phone]   Please call the care guide team at [phone] if you need to cancel or reschedule your appointment.   Patient verbalizes understanding of instructions and care plan provided today and agrees to view in [place]. Active MyChart status confirmed with patient.

## 2021-02-21 NOTE — Telephone Encounter (Signed)
I called the patient and LVM informing g him that the Rybelsus has arrived at the office and he can pick it up at his convenience.  [place]

## 2021-02-23 ENCOUNTER — Encounter: Payer: Self-pay | Admitting: Family Medicine

## 2021-03-14 DIAGNOSIS — I2510 Atherosclerotic heart disease of native coronary artery without angina pectoris: Secondary | ICD-10-CM

## 2021-03-14 DIAGNOSIS — I10 Essential (primary) hypertension: Secondary | ICD-10-CM | POA: Diagnosis not present

## 2021-03-14 DIAGNOSIS — E1159 Type 2 diabetes mellitus with other circulatory complications: Secondary | ICD-10-CM

## 2021-03-14 DIAGNOSIS — E785 Hyperlipidemia, unspecified: Secondary | ICD-10-CM | POA: Diagnosis not present

## 2021-03-20 ENCOUNTER — Other Ambulatory Visit: Payer: Self-pay | Admitting: Family Medicine

## 2021-03-20 DIAGNOSIS — E1159 Type 2 diabetes mellitus with other circulatory complications: Secondary | ICD-10-CM

## 2021-03-23 ENCOUNTER — Ambulatory Visit: Payer: Self-pay | Admitting: Pharmacist

## 2021-03-23 NOTE — Chronic Care Management (AMB) (Signed)
?  Chronic Care Management  ? ?Note ? ?03/23/2021 ?Name: Dakota Gonzalez MRN: [id number] DOB: November 30, 1945 ? ? ? ?Closing pharmacy CCM case at this time. . Patient has clinic contact information for future questions or concerns.  ? ?Catie Darnelle Maffucci, PharmD, [place], CPP ?Clinical Pharmacist ?Therapist, music at [employer] ?[phone] ? ?

## 2021-03-23 NOTE — Patient Instructions (Signed)
Hi Dan,  ? ?I am being asked to quickly transition into another role within the health system, so unfortunately I am unable to keep our next appointment. Please continue to follow up with your primary care provider as scheduled.  ? ?As a reminder -  ? ?[employer] Patient Assistance Program for Rybelsus has an auto-refill program where you do not need to call and request a refill each time. You should receive your medication shipment before you run out of your medication. However, if you have any issues or need assistance, you can call them at [phone]. They are available Monday though Friday 8:00 AM - 8:00 PM.  ? ?For prescription refills for Jardiance through the [facility] Patient [facility], call [phone]. Once you have been enrolled in the program, your prescriptions can easily be refilled by contacting the phone number above Monday though Friday 8:30 AM - 6:00 PM.  ? ? ?It has been a pleasure working with you! ? ?Catie Darnelle Maffucci, PharmD ? ?

## 2021-04-28 ENCOUNTER — Telehealth: Payer: Self-pay

## 2021-04-28 NOTE — Telephone Encounter (Signed)
I called and spoke with the patient and informed him that his Rybelsus has arrived at the office and he can pick up before 5 pm. Vincy Feliz,cma  ?

## 2021-05-15 ENCOUNTER — Telehealth: Payer: Self-pay

## 2021-05-15 NOTE — Telephone Encounter (Signed)
The patient has a visit later this week. I will plan on discussing it with him then.  ?

## 2021-05-16 ENCOUNTER — Ambulatory Visit (INDEPENDENT_AMBULATORY_CARE_PROVIDER_SITE_OTHER): Payer: Medicare Other

## 2021-05-16 VITALS — Ht 68.0 in | Wt 192.0 lb

## 2021-05-16 DIAGNOSIS — Z0000 Encounter for general adult medical examination without abnormal findings: Secondary | ICD-10-CM

## 2021-05-16 DIAGNOSIS — Z1211 Encounter for screening for malignant neoplasm of colon: Secondary | ICD-10-CM | POA: Diagnosis not present

## 2021-05-16 NOTE — Patient Instructions (Addendum)
?  Mr. Coye , ?Thank you for taking time to come for your Medicare Wellness Visit. I appreciate your ongoing commitment to your health goals. Please review the following plan we discussed and let me know if I can assist you in the future.  ? ?These are the goals we discussed: ? Goals   ? ?  ? Patient Stated  ?   DIET - REDUCE SUGAR INTAKE (pt-stated)   ?   Low carb diet ? ?  ?  ? Other  ?   Maintain Healthy Lifestyle    ? ?  ?  ?This is a list of the screening recommended for you and due dates:  ?Health Maintenance  ?Topic Date Due  ? Cologuard (Stool DNA test)  04/18/2021  ? Hemoglobin A1C  05/15/2021  ? Complete foot exam   07/15/2021  ? Flu Shot  08/15/2021  ? Eye exam for diabetics  11/16/2021  ? Tetanus Vaccine  03/28/2027  ? Pneumonia Vaccine  Completed  ? COVID-19 Vaccine  Completed  ? Hepatitis C Screening: USPSTF Recommendation to screen - Ages 55-79 yo.  Completed  ? Zoster (Shingles) Vaccine  Completed  ? HPV Vaccine  Aged Out  ?  ?

## 2021-05-16 NOTE — Progress Notes (Signed)
Subjective:   Dakota Gonzalez is a 76 y.o. male who presents for Medicare Annual/Subsequent preventive examination.  Review of Systems    No ROS.  Medicare Wellness Virtual Visit.  Visual/audio telehealth visit, UTA vital signs.   See social history for additional risk factors.   Cardiac Risk Factors include: advanced age (>82men, >16 women);male gender;diabetes mellitus;hypertension     Objective:    Today's Vitals   05/16/21 0903  Weight: 192 lb (87.1 kg)  Height: 5\' 8"  (1.727 m)   Body mass index is 29.19 kg/m.     05/03/2020    9:16 AM 04/30/2019    9:18 AM 04/29/2018   11:50 AM 04/26/2017    3:25 PM 04/25/2016    3:15 PM 11/28/2014    9:58 AM  Advanced Directives  Does Patient Have a Medical Advance Directive? Yes Yes Yes Yes No No  Type of Estate agent of [employer] Power of [place];Living will Living will Healthcare Power of [place];Living will    Does patient want to make changes to medical advance directive? No - Patient declined No - Patient declined No - Patient declined No - Patient declined    Copy of Healthcare Power of Attorney in Chart? No - copy requested No - copy requested  No - copy requested    Would patient like information on creating a medical advance directive?     No - Patient declined     Current Medications (verified) Outpatient Encounter Medications as of 05/16/2021  Medication Sig   aspirin 81 MG tablet Take 81 mg by mouth daily.   atorvastatin (LIPITOR) 40 MG tablet TAKE 1 TABLET(40 MG) BY MOUTH DAILY   blood glucose meter kit and supplies One touch ultra, test once daily, E11.9   Blood Glucose Monitoring Suppl (ONE TOUCH ULTRA 2) w/Device KIT 1 Device by Does not apply route daily as needed.   Coenzyme Q10 (COQ-10) 100 MG CAPS Take 1 tablet by mouth daily.   cyclobenzaprine (FLEXERIL) 10 MG tablet Take 1 tablet (10 mg total) by mouth every 8 (eight) hours as needed for muscle spasms (do not drive while using,  may make you drowsy).   docusate sodium (COLACE) 100 MG capsule Take 100 mg by mouth daily as needed for mild constipation.   empagliflozin (JARDIANCE) 25 MG TABS tablet Take 1 tablet (25 mg total) by mouth daily.   finasteride (PROSCAR) 5 MG tablet TAKE 1 TABLET(5 MG) BY MOUTH DAILY   glucose blood (ONE TOUCH ULTRA TEST) test strip TEST TWICE A DAILY AS DIRECTED   Lancets (ONETOUCH DELICA PLUS LANCET33G) MISC USE TO CHECK BLOOD SUGAR TWICE DAILY   Lancets Misc. MISC ONETOUCH DELICA LANCETS FINE MISC check blood sugar twice daily   lisinopril (ZESTRIL) 10 MG tablet Take 1 tablet (10 mg total) by mouth daily.   metFORMIN (GLUCOPHAGE) 500 MG tablet TAKE 2 TABLETS(1000 MG) BY MOUTH TWICE DAILY WITH A MEAL   Multiple Vitamins-Minerals (CENTRUM SILVER PO) Take 1 tablet by mouth daily.   Semaglutide (RYBELSUS) 7 MG TABS Take 7 mg by mouth daily.   tamsulosin (FLOMAX) 0.4 MG CAPS capsule TAKE 1 CAPSULE(0.4 MG) BY MOUTH DAILY   No facility-administered encounter medications on file as of 05/16/2021.    Allergies (verified) Aspartame   History: Past Medical History:  Diagnosis Date   Allergy    Arthritis    Asthma    Depression    Diabetes mellitus 2007   Elevated PSA 07/09/2011   Enlarged  prostate    GERD (gastroesophageal reflux disease)    Headache(784.0)    Hyperlipidemia    Hypertension    Migraine    Sinusitis    Past Surgical History:  Procedure Laterality Date   BACK SURGERY  10/2003   ruptured disc, L4-5, [facility]   HEMANGIOMA EXCISION  2014   right upper arm, Dr. Lorretta Harp   PROSTATE BIOPSY  2014   Dr. Achilles Dunk   TONSILLECTOMY AND ADENOIDECTOMY  1960   Family History  Problem Relation Age of Onset   Stroke Mother    Kidney disease Mother    Diabetes Mother    Cancer Father    Stroke Father    Kidney disease Father    Diabetes Sister    Cancer Other        colon, breast   Social History   Socioeconomic History   Marital status: Widowed    Spouse name:  Not on file   Number of children: Not on file   Years of education: Not on file   Highest education level: Not on file  Occupational History   Not on file  Tobacco Use   Smoking status: Former   Smokeless tobacco: Never  Vaping Use   Vaping Use: Never used  Substance and Sexual Activity   Alcohol use: No   Drug use: No   Sexual activity: Never  Other Topics Concern   Not on file  Social History Narrative   Lives in [place]. 1 son lives in [place].      Work - Disabled, previously Microbiologist and railroad.      Diet - regular   Exercise - ellipitical machine at home   Social Determinants of Health   Financial Resource Strain: Medium Risk   Difficulty of Paying Living Expenses: Somewhat hard  Food Insecurity: No Food Insecurity   Worried About Running Out of Food in the Last Year: Never true   Ran Out of Food in the Last Year: Never true  Transportation Needs: No Transportation Needs   Lack of Transportation (Medical): No   Lack of Transportation (Non-Medical): No  Physical Activity: Sufficiently Active   Days of Exercise per Week: 7 days   Minutes of Exercise per Session: 30 min  Stress: No Stress Concern Present   Feeling of Stress : Only a little  Social Connections: Unknown   Frequency of Communication with Friends and Family: More than three times a week   Frequency of Social Gatherings with Friends and Family: More than three times a week   Attends Religious Services: Not on Scientist, clinical (histocompatibility and immunogenetics) or Organizations: Not on file   Attends Banker Meetings: Not on file   Marital Status: Not on file    Tobacco Counseling Counseling given: Not Answered   Clinical Intake:  Pre-visit preparation completed: Yes        Diabetes: No  How often do you need to have someone help you when you read instructions, pamphlets, or other written materials from your doctor or pharmacy?: 1 - Never  Interpreter Needed?: No       Activities of Daily Living    05/16/2021    9:10 AM  In your present state of health, do you have any difficulty performing the following activities:  Hearing? 0  Vision? 0  Difficulty concentrating or making decisions? 0  Walking or climbing stairs? 0  Dressing or bathing? 0  Doing errands, shopping? 0  Preparing Food  and eating ? N  Using the Toilet? N  In the past six months, have you accidently leaked urine? N  Do you have problems with loss of bowel control? N  Managing your Medications? N  Managing your Finances? N  Housekeeping or managing your Housekeeping? N    Patient Care Team: Glori Luis, MD as PCP - General (Family Medicine) Iran Ouch, MD as Consulting Physician (Cardiology)  Indicate any recent Medical Services you may have received from other than [facility] providers in the past year (date may be approximate).     Assessment:   This is a routine wellness examination for Dakota Gonzalez.  Virtual Visit via Telephone Note  I connected with  Dakota Gonzalez on 05/16/21 at  9:00 AM EDT by telephone and verified that I am speaking with the correct person using two identifiers.  Persons participating in the virtual visit: patient/Nurse Health Advisor   I discussed the limitations of performing an evaluation and management service by telehealth. The patient expressed understanding and agreed to proceed. We continued and completed visit with audio only. Some vital signs may be absent or patient reported.   Hearing/Vision screen Hearing Screening - Comments:: Patient is able to hear conversational tones without difficulty. No issues reported. Vision Screening - Comments:: Followed by [facility] Wears corrective lenses when reading Annual visits They have regular follow up with the ophthalmologist  Dietary issues and exercise activities discussed: Current Exercise Habits: Home exercise routine, Type of exercise: treadmill, Time (Minutes): 20,  Frequency (Times/Week): 5, Weekly Exercise (Minutes/Week): 100, Intensity: Mild Regular diet; monitors  Good water intake   Goals Addressed             This Visit's Progress    Maintain Healthy Lifestyle          Depression Screen    05/16/2021    9:10 AM 11/15/2020    9:11 AM 05/03/2020    9:15 AM 04/26/2020   12:55 PM 07/14/2019    8:12 AM 04/30/2019    9:18 AM 04/13/2019   10:28 AM  PHQ 2/9 Scores  PHQ - 2 Score 0 0 0 0 0 0 0    Fall Risk    05/16/2021    9:09 AM 11/15/2020    9:11 AM 08/31/2020   10:05 AM 05/03/2020    9:18 AM 04/26/2020   12:55 PM  Fall Risk   Falls in the past year? 0 0 0 0 0  Number falls in past yr: 0 0 0 0 0  Injury with Fall?  0  0   Risk for fall due to :  No Fall Risks     Follow up Falls evaluation completed Falls evaluation completed Falls evaluation completed Falls evaluation completed Falls evaluation completed   FALL RISK PREVENTION PERTAINING TO THE HOME: Home free of loose throw rugs in walkways, pet beds, electrical cords, etc? Yes  Adequate lighting in your home to reduce risk of falls? Yes   ASSISTIVE DEVICES UTILIZED TO PREVENT FALLS: Life alert? No  Use of a cane, walker or w/c? No   TIMED UP AND GO: Was the test performed? No .   Cognitive Function: Patient is alert and oriented x3.  Enjoys brain health stimulating games/activities.     04/25/2016    3:41 PM  MMSE - Mini Mental State Exam  Orientation to time 5  Orientation to Place 5  Registration 3  Attention/ Calculation 5  Recall 3  Language- name 2  objects 2  Language- repeat 1  Language- follow 3 step command 3  Language- read & follow direction 1  Write a sentence 1  Copy design 1  Total score 30        04/30/2019    9:22 AM 04/29/2018   11:55 AM 04/26/2017    3:27 PM  6CIT Screen  What Year? 0 points 0 points 0 points  What month? 0 points 0 points 0 points  What time? 0 points 0 points 0 points  Count back from 20  0 points 0 points  Months in  reverse  0 points 0 points  Repeat phrase  0 points 0 points  Total Score  0 points 0 points    Immunizations Immunization History  Administered Date(s) Administered   Fluad Quad(high Dose 65+) 09/26/2018   Influenza Split 10/17/2011   Influenza, High Dose Seasonal PF 09/19/2016, 10/03/2017   Influenza,inj,Quad PF,6+ Mos 09/22/2012, 10/10/2013, 11/10/2014   Influenza-Unspecified 11/03/2017, 10/20/2019, 09/06/2020   Moderna Covid-19 Vaccine Bivalent Booster 83yrs & up 11/08/2020   Moderna SARS-COV2 Booster Vaccination 10/16/2019   Moderna Sars-Covid-2 Vaccination 02/27/2019, 03/27/2019   Pneumococcal Conjugate-13 06/12/2013   Pneumococcal Polysaccharide-23 10/17/2011   Tdap 03/20/2017   Zoster Recombinat (Shingrix) 09/06/2020, 11/08/2020   Screening Tests Health Maintenance  Topic Date Due   Fecal DNA (Cologuard)  04/18/2021   HEMOGLOBIN A1C  05/15/2021   FOOT EXAM  07/15/2021   INFLUENZA VACCINE  08/15/2021   OPHTHALMOLOGY EXAM  11/16/2021   TETANUS/TDAP  03/28/2027   Pneumonia Vaccine 50+ Years old  Completed   COVID-19 Vaccine  Completed   Hepatitis C Screening  Completed   Zoster Vaccines- Shingrix  Completed   HPV VACCINES  Aged Out   Health Maintenance Health Maintenance Due  Topic Date Due   Fecal DNA (Cologuard)  04/18/2021   HEMOGLOBIN A1C  05/15/2021   Cologuard ordered per consent.   Lung Cancer Screening: (Low Dose CT Chest recommended if Age 52-80 years, 30 pack-year currently smoking OR have quit w/in 15years.) does not qualify.   Vision Screening: Recommended annual ophthalmology exams for early detection of glaucoma and other disorders of the eye.  Dental Screening: Recommended annual dental exams for proper oral hygiene  Community Resource Referral / Chronic Care Management: CRR required this visit?  No   CCM required this visit?  No      Plan:   Keep all routine maintenance appointments.   I have personally reviewed and noted the following  in the patient's chart:   Medical and social history Use of alcohol, tobacco or illicit drugs  Current medications and supplements including opioid prescriptions. Patient is not currently taking opioid prescriptions. Functional ability and status Nutritional status Physical activity Advanced directives List of other physicians Hospitalizations, surgeries, and ER visits in previous 12 months Vitals Screenings to include cognitive, depression, and falls Referrals and appointments  In addition, I have reviewed and discussed with patient certain preventive protocols, quality metrics, and best practice recommendations. A written personalized care plan for preventive services as well as general preventive health recommendations were provided to patient.     Ashok Pall, LPN   01/20/1094

## 2021-05-16 NOTE — Addendum Note (Signed)
Addended by: [name] on: 05/16/2021 10:04 AM ? ? Modules accepted: Orders ? ?

## 2021-05-17 ENCOUNTER — Encounter: Payer: Self-pay | Admitting: Family Medicine

## 2021-05-17 ENCOUNTER — Ambulatory Visit (INDEPENDENT_AMBULATORY_CARE_PROVIDER_SITE_OTHER): Payer: Medicare Other | Admitting: Family Medicine

## 2021-05-17 VITALS — BP 130/70 | HR 87 | Temp 98.4°F | Ht 68.0 in | Wt 191.0 lb

## 2021-05-17 DIAGNOSIS — G2581 Restless legs syndrome: Secondary | ICD-10-CM

## 2021-05-17 DIAGNOSIS — I10 Essential (primary) hypertension: Secondary | ICD-10-CM

## 2021-05-17 DIAGNOSIS — E1159 Type 2 diabetes mellitus with other circulatory complications: Secondary | ICD-10-CM | POA: Diagnosis not present

## 2021-05-17 DIAGNOSIS — E785 Hyperlipidemia, unspecified: Secondary | ICD-10-CM | POA: Diagnosis not present

## 2021-05-17 DIAGNOSIS — G8929 Other chronic pain: Secondary | ICD-10-CM | POA: Diagnosis not present

## 2021-05-17 DIAGNOSIS — Z1211 Encounter for screening for malignant neoplasm of colon: Secondary | ICD-10-CM | POA: Diagnosis not present

## 2021-05-17 DIAGNOSIS — M545 Low back pain, unspecified: Secondary | ICD-10-CM

## 2021-05-17 LAB — COMPREHENSIVE METABOLIC PANEL
ALT: 22 U/L (ref 0–53)
AST: 22 U/L (ref 0–37)
Albumin: 5 g/dL (ref 3.5–5.2)
Alkaline Phosphatase: 56 U/L (ref 39–117)
BUN: 14 mg/dL (ref 6–23)
CO2: 28 mEq/L (ref 19–32)
Calcium: 10.5 mg/dL (ref 8.4–10.5)
Chloride: 100 mEq/L (ref 96–112)
Creatinine, Ser: 0.98 mg/dL (ref 0.40–1.50)
GFR: 75.76 mL/min (ref >60.00)
Glucose, Bld: 124 mg/dL — ABNORMAL HIGH (ref 70–99)
Potassium: 4.2 mEq/L (ref 3.5–5.1)
Sodium: 138 mEq/L (ref 135–145)
Total Bilirubin: 1.5 mg/dL — ABNORMAL HIGH (ref 0.2–1.2)
Total Protein: 7.6 g/dL (ref 6.0–8.3)

## 2021-05-17 LAB — MICROALBUMIN / CREATININE URINE RATIO
Creatinine,U: 57.9 mg/dL
Microalb Creat Ratio: 1.2 mg/g (ref 0.0–30.0)
Microalb, Ur: <0.7 mg/dL (ref 0.0–1.9)

## 2021-05-17 LAB — LIPID PANEL
Cholesterol: 131 mg/dL (ref 0–200)
HDL: 52.4 mg/dL (ref >39.00)
LDL Cholesterol: 56 mg/dL (ref 0–99)
NonHDL: 78.15
Total CHOL/HDL Ratio: 2
Triglycerides: 110 mg/dL (ref 0.0–149.0)
VLDL: 22 mg/dL (ref 0.0–40.0)

## 2021-05-17 LAB — HEMOGLOBIN A1C: Hgb A1c MFr Bld: 6.8 % — ABNORMAL HIGH (ref 4.6–6.5)

## 2021-05-17 NOTE — Progress Notes (Signed)
?[name], MD ?Phone: [phone] ? ?Dakota Gonzalez is a 76 y.o. male who presents today for f/u. ? ?HYPERTENSION ?Disease Monitoring ?Home BP Monitoring 100-110/60-65 Chest pain- no    Dyspnea- no ?Medications ?Compliance-  taking lisinopril.   Edema- no ?BMET ?   ?Component Value Date/Time  ? NA 137 11/15/2020 0917  ? NA 139 12/15/2012 0806  ? K 4.2 11/15/2020 0917  ? K 4.1 12/15/2012 0806  ? CL 100 11/15/2020 0917  ? CL 107 12/15/2012 0806  ? CO2 30 11/15/2020 0917  ? CO2 28 12/15/2012 0806  ? GLUCOSE 114 (H) 11/15/2020 8786  ? GLUCOSE 138 (H) 12/15/2012 0806  ? BUN 14 11/15/2020 0917  ? BUN 11 12/15/2012 0806  ? CREATININE 0.92 11/15/2020 0917  ? CREATININE 1.11 01/14/2020 1422  ? CALCIUM 10.1 11/15/2020 0917  ? CALCIUM 9.5 12/15/2012 0806  ? GFRNONAA >60 11/28/2014 1135  ? GFRNONAA >60 12/15/2012 0806  ? GFRAA >60 11/28/2014 1135  ? GFRAA >60 12/15/2012 0806  ? ?DIABETES ?Disease Monitoring: ?Blood Sugar ranges-highest has been 165 Polyuria/phagia/dipsia- no      Optho- UTD ?Medications: ?Compliance- taking rybelsus, metformin, jardiance Hypoglycemic symptoms- no ? ?Restless leg syndrome/back pain: Patient notes he occasionally takes Flexeril for this.  He notes that this is a rare occurrence when he does take this.  He takes it at night.  At times he is somewhat groggy the next day. ? ?Social History  ? ?Tobacco Use  ?Smoking Status Former  ?Smokeless Tobacco Never  ? ? ?Current Outpatient Medications on File Prior to Visit  ?Medication Sig Dispense Refill  ? aspirin 81 MG tablet Take 81 mg by mouth daily.    ? atorvastatin (LIPITOR) 40 MG tablet TAKE 1 TABLET(40 MG) BY MOUTH DAILY 90 tablet 1  ? blood glucose meter kit and supplies One touch ultra, test once daily, E11.9 1 each 0  ? Blood Glucose Monitoring Suppl (ONE TOUCH ULTRA 2) w/Device KIT 1 Device by Does not apply route daily as needed. 1 kit 0  ? Coenzyme Q10 (COQ-10) 100 MG CAPS Take 1 tablet by mouth daily.    ? cyclobenzaprine  (FLEXERIL) 10 MG tablet Take 1 tablet (10 mg total) by mouth every 8 (eight) hours as needed for muscle spasms (do not drive while using, may make you drowsy). 20 tablet 0  ? docusate sodium (COLACE) 100 MG capsule Take 100 mg by mouth daily as needed for mild constipation.    ? empagliflozin (JARDIANCE) 25 MG TABS tablet Take 1 tablet (25 mg total) by mouth daily. 90 tablet 3  ? finasteride (PROSCAR) 5 MG tablet TAKE 1 TABLET(5 MG) BY MOUTH DAILY 90 tablet 1  ? glucose blood (ONE TOUCH ULTRA TEST) test strip TEST TWICE A DAILY AS DIRECTED 200 each 5  ? Lancets (ONETOUCH DELICA PLUS [id number]) MISC USE TO CHECK BLOOD SUGAR TWICE DAILY 200 each 5  ? Lancets Misc. MISC ONETOUCH DELICA LANCETS FINE MISC check blood sugar twice daily 200 each 5  ? lisinopril (ZESTRIL) 10 MG tablet Take 1 tablet (10 mg total) by mouth daily. 90 tablet 3  ? metFORMIN (GLUCOPHAGE) 500 MG tablet TAKE 2 TABLETS(1000 MG) BY MOUTH TWICE DAILY WITH A MEAL 360 tablet 3  ? Multiple Vitamins-Minerals (CENTRUM SILVER PO) Take 1 tablet by mouth daily.    ? Semaglutide (RYBELSUS) 7 MG TABS Take 7 mg by mouth daily. 30 tablet 0  ? tamsulosin (FLOMAX) 0.4 MG CAPS capsule TAKE 1 CAPSULE(0.4 MG) BY  MOUTH DAILY 90 capsule 3  ? ?No current facility-administered medications on file prior to visit.  ? ? ? ?ROS see history of present illness ? ?Objective ? ?Physical Exam ?Vitals:  ? 05/17/21 0904 05/17/21 0916  ?BP: 140/70 130/70  ?Pulse: 87   ?Temp: 98.4 ?F (36.9 ?C)   ?SpO2: 97%   ? ? ?BP Readings from Last 3 Encounters:  ?05/17/21 130/70  ?11/15/20 130/80  ?08/31/20 (!) 150/80  ? ?Wt Readings from Last 3 Encounters:  ?05/17/21 191 lb (86.6 kg)  ?05/16/21 192 lb (87.1 kg)  ?11/15/20 192 lb (87.1 kg)  ? ? ?Physical Exam ?Constitutional:   ?   General: He is not in acute distress. ?   Appearance: He is not diaphoretic.  ?Cardiovascular:  ?   Rate and Rhythm: Normal rate and regular rhythm.  ?   Heart sounds: Normal heart sounds.  ?Pulmonary:  ?   Effort:  Pulmonary effort is normal.  ?   Breath sounds: Normal breath sounds.  ?Skin: ?   General: Skin is warm and dry.  ?Neurological:  ?   Mental Status: He is alert.  ? ? ? ?Assessment/Plan: Please see individual problem list. ? ?Problem List Items Addressed This Visit   ? ? Chronic back pain (Chronic)  ?  Stable.  He can continue as needed Flexeril.  I did encourage him to cut the pill in half to see if that would be helpful with the drowsiness. ? ?  ?  ? Diabetes mellitus type 2, controlled ([place]) (Chronic)  ?  Seems well controlled.  We will check an A1c.  He will continue Jardiance 25 mg once daily, metformin 1000 mg twice daily, and Rybelsus 7 mg daily. ? ?  ?  ? Relevant Orders  ? Urine Microalbumin w/creat. ratio  ? HgB A1c  ? HTN (hypertension) - Primary (Chronic)  ?  Well-controlled.  He will continue lisinopril 10 mg daily. ? ?  ?  ? Relevant Orders  ? Urine Microalbumin w/creat. ratio  ? Lipid panel  ? Comp Met (CMET)  ? Hyperlipidemia (Chronic)  ? Relevant Orders  ? Lipid panel  ? Comp Met (CMET)  ? Restless legs (Chronic)  ?  Chronic issue.  He can continue rare use of Flexeril.  I did encourage him to try half a tablet to see if that cuts down on drowsiness. ? ?  ?  ? ?Other Visit Diagnoses   ? ? Colon cancer screening      ? Relevant Orders  ? Cologuard  ? ?  ? ? ? ?Health Maintenance: Cologuard ordered.  He confirmed that he does not have a family history of colon cancer.  No rectal bleeding at this time. ? ?Return in about 6 months (around 11/17/2021) for dm, htn. ? ? ?[name], MD ?[place] ? ?

## 2021-05-17 NOTE — Patient Instructions (Signed)
Nice to see you. ?We will get lab work today and contact you with the results. ?If you do not receive your Cologuard please let us know. ?

## 2021-05-17 NOTE — Assessment & Plan Note (Signed)
Seems well controlled.  We will check an A1c.  He will continue Jardiance 25 mg once daily, metformin 1000 mg twice daily, and Rybelsus 7 mg daily. ?

## 2021-05-17 NOTE — Assessment & Plan Note (Signed)
Stable.  He can continue as needed Flexeril.  I did encourage him to cut the pill in half to see if that would be helpful with the drowsiness. ?

## 2021-05-17 NOTE — Assessment & Plan Note (Signed)
Well-controlled.  He will continue lisinopril 10 mg daily. ?

## 2021-05-17 NOTE — Assessment & Plan Note (Signed)
Chronic issue.  He can continue rare use of Flexeril.  I did encourage him to try half a tablet to see if that cuts down on drowsiness. ?

## 2021-05-18 ENCOUNTER — Other Ambulatory Visit: Payer: Self-pay

## 2021-05-18 DIAGNOSIS — R17 Unspecified jaundice: Secondary | ICD-10-CM

## 2021-05-22 DIAGNOSIS — Z1211 Encounter for screening for malignant neoplasm of colon: Secondary | ICD-10-CM | POA: Diagnosis not present

## 2021-06-01 LAB — COLOGUARD: COLOGUARD: NEGATIVE

## 2021-06-05 ENCOUNTER — Other Ambulatory Visit (INDEPENDENT_AMBULATORY_CARE_PROVIDER_SITE_OTHER): Payer: Medicare Other

## 2021-06-05 DIAGNOSIS — R17 Unspecified jaundice: Secondary | ICD-10-CM

## 2021-06-05 LAB — HEPATIC FUNCTION PANEL
ALT: 22 U/L (ref 0–53)
AST: 20 U/L (ref 0–37)
Albumin: 4.8 g/dL (ref 3.5–5.2)
Alkaline Phosphatase: 52 U/L (ref 39–117)
Bilirubin, Direct: 0.3 mg/dL (ref 0.0–0.3)
Total Bilirubin: 1.3 mg/dL — ABNORMAL HIGH (ref 0.2–1.2)
Total Protein: 7.3 g/dL (ref 6.0–8.3)

## 2021-06-06 ENCOUNTER — Encounter: Payer: Self-pay | Admitting: Family Medicine

## 2021-06-07 ENCOUNTER — Other Ambulatory Visit: Payer: Self-pay | Admitting: Family Medicine

## 2021-06-23 ENCOUNTER — Telehealth: Payer: Self-pay

## 2021-06-23 NOTE — Telephone Encounter (Signed)
I called the patient and LVM for the patient stating that his Rybelsus has arrived  adnis at the front desk and he can pick it up between 8 am-5 pm Monday-Friday.  Damyan Corne,cma

## 2021-06-28 ENCOUNTER — Other Ambulatory Visit: Payer: Self-pay | Admitting: Family Medicine

## 2021-07-04 ENCOUNTER — Other Ambulatory Visit (INDEPENDENT_AMBULATORY_CARE_PROVIDER_SITE_OTHER): Payer: Medicare Other

## 2021-07-04 LAB — HEPATIC FUNCTION PANEL
ALT: 18 U/L (ref 0–53)
AST: 17 U/L (ref 0–37)
Albumin: 4.6 g/dL (ref 3.5–5.2)
Alkaline Phosphatase: 53 U/L (ref 39–117)
Bilirubin, Direct: 0.2 mg/dL (ref 0.0–0.3)
Total Bilirubin: 1.4 mg/dL — ABNORMAL HIGH (ref 0.2–1.2)
Total Protein: 7.2 g/dL (ref 6.0–8.3)

## 2021-07-04 LAB — CBC
HCT: 49.8 % (ref 39.0–52.0)
Hemoglobin: 16.4 g/dL (ref 13.0–17.0)
MCHC: 32.9 g/dL (ref 30.0–36.0)
MCV: 91 fl (ref 78.0–100.0)
Platelets: 138 10*3/uL — ABNORMAL LOW (ref 150.0–400.0)
RBC: 5.47 Mil/uL (ref 4.22–5.81)
RDW: 14.1 % (ref 11.5–15.5)
WBC: 7.5 10*3/uL (ref 4.0–10.5)

## 2021-07-05 LAB — LACTATE DEHYDROGENASE: LDH: 144 U/L (ref 120–250)

## 2021-07-05 LAB — SPECIMEN COMPROMISED

## 2021-07-05 LAB — RETICULOCYTES
ABS Retic: 72410 cells/uL (ref 25000–90000)
Retic Ct Pct: 1.3 %

## 2021-07-06 ENCOUNTER — Other Ambulatory Visit: Payer: Self-pay

## 2021-07-06 DIAGNOSIS — R17 Unspecified jaundice: Secondary | ICD-10-CM

## 2021-07-10 ENCOUNTER — Other Ambulatory Visit: Payer: Self-pay | Admitting: Family Medicine

## 2021-07-31 ENCOUNTER — Other Ambulatory Visit: Payer: Self-pay | Admitting: Family Medicine

## 2021-07-31 DIAGNOSIS — E785 Hyperlipidemia, unspecified: Secondary | ICD-10-CM

## 2021-08-15 ENCOUNTER — Telehealth: Payer: Self-pay | Admitting: Family Medicine

## 2021-08-15 ENCOUNTER — Telehealth: Payer: Medicare Other

## 2021-08-15 NOTE — Telephone Encounter (Signed)
Patient called about his lisinopril (ZESTRIL) 10 MG tablet. For awhile he has been running low bp. Patient states that he has spoke to Dr Caryl Bis about low bp in the past. Average bp is runs around 100/60, low bp in the past week has been 88/60, 90/60. Patient would like to know if Dr Caryl Bis would lower bp medication.

## 2021-08-16 NOTE — Telephone Encounter (Signed)
I called and spoke with the patient and informed him to stop the lisinopril and to monitor his BP, he understood. He is scheduled in 2 weeks for a BP check as a nurse visit.  Jarome Trull,cma

## 2021-08-16 NOTE — Telephone Encounter (Signed)
He can stop the lisinopril and monitor his BPs. If it goes up above 130/80 he needs to let us know. He should have a nurse BP check in 2 weeks.

## 2021-08-30 ENCOUNTER — Ambulatory Visit (INDEPENDENT_AMBULATORY_CARE_PROVIDER_SITE_OTHER): Payer: Medicare Other

## 2021-08-30 VITALS — BP 128/82 | HR 98

## 2021-08-30 DIAGNOSIS — I10 Essential (primary) hypertension: Secondary | ICD-10-CM

## 2021-08-30 NOTE — Progress Notes (Signed)
Patient came in today for BP check taken in left arm with normal sized cuff. Reading was 128/82 & P 98. Pt stated that he has been monitoring BP at home & seeing numbers in 110-120/78's-80's. I let patient go considering BP was not high.

## 2021-09-05 ENCOUNTER — Telehealth: Payer: Self-pay

## 2021-09-05 NOTE — Telephone Encounter (Signed)
I called and spoke with patient and informed him to pick up his medications Rybelsus.  He understood.  Monroe Toure,cma

## 2021-10-02 ENCOUNTER — Other Ambulatory Visit (INDEPENDENT_AMBULATORY_CARE_PROVIDER_SITE_OTHER): Payer: Medicare Other

## 2021-10-02 DIAGNOSIS — R17 Unspecified jaundice: Secondary | ICD-10-CM

## 2021-10-02 LAB — CBC
HCT: 47.3 % (ref 39.0–52.0)
Hemoglobin: 15.6 g/dL (ref 13.0–17.0)
MCHC: 32.9 g/dL (ref 30.0–36.0)
MCV: 90.7 fl (ref 78.0–100.0)
Platelets: 162 10*3/uL (ref 150.0–400.0)
RBC: 5.22 Mil/uL (ref 4.22–5.81)
RDW: 13.6 % (ref 11.5–15.5)
WBC: 6.8 10*3/uL (ref 4.0–10.5)

## 2021-10-16 ENCOUNTER — Other Ambulatory Visit: Payer: Self-pay | Admitting: Family Medicine

## 2021-10-16 DIAGNOSIS — I10 Essential (primary) hypertension: Secondary | ICD-10-CM

## 2021-10-26 ENCOUNTER — Other Ambulatory Visit: Payer: Self-pay | Admitting: Family Medicine

## 2021-10-26 DIAGNOSIS — E1159 Type 2 diabetes mellitus with other circulatory complications: Secondary | ICD-10-CM

## 2021-11-16 DIAGNOSIS — E119 Type 2 diabetes mellitus without complications: Secondary | ICD-10-CM | POA: Diagnosis not present

## 2021-11-16 LAB — HM DIABETES EYE EXAM

## 2021-11-17 ENCOUNTER — Encounter: Payer: Self-pay | Admitting: Family Medicine

## 2021-11-17 ENCOUNTER — Ambulatory Visit (INDEPENDENT_AMBULATORY_CARE_PROVIDER_SITE_OTHER): Payer: Medicare Other | Admitting: Family Medicine

## 2021-11-17 ENCOUNTER — Telehealth: Payer: Self-pay

## 2021-11-17 VITALS — BP 118/80 | HR 87 | Temp 98.6°F | Ht 68.0 in | Wt 189.4 lb

## 2021-11-17 DIAGNOSIS — M545 Low back pain, unspecified: Secondary | ICD-10-CM

## 2021-11-17 DIAGNOSIS — G2581 Restless legs syndrome: Secondary | ICD-10-CM

## 2021-11-17 DIAGNOSIS — G8929 Other chronic pain: Secondary | ICD-10-CM

## 2021-11-17 DIAGNOSIS — M25512 Pain in left shoulder: Secondary | ICD-10-CM | POA: Diagnosis not present

## 2021-11-17 DIAGNOSIS — I10 Essential (primary) hypertension: Secondary | ICD-10-CM

## 2021-11-17 DIAGNOSIS — E1159 Type 2 diabetes mellitus with other circulatory complications: Secondary | ICD-10-CM

## 2021-11-17 LAB — BASIC METABOLIC PANEL
BUN: 13 mg/dL (ref 6–23)
CO2: 29 mEq/L (ref 19–32)
Calcium: 9.8 mg/dL (ref 8.4–10.5)
Chloride: 102 mEq/L (ref 96–112)
Creatinine, Ser: 0.76 mg/dL (ref 0.40–1.50)
GFR: 88 mL/min (ref >60.00)
Glucose, Bld: 108 mg/dL — ABNORMAL HIGH (ref 70–99)
Potassium: 3.9 mEq/L (ref 3.5–5.1)
Sodium: 139 mEq/L (ref 135–145)

## 2021-11-17 LAB — HEMOGLOBIN A1C: Hgb A1c MFr Bld: 6.8 % — ABNORMAL HIGH (ref 4.6–6.5)

## 2021-11-17 NOTE — Assessment & Plan Note (Signed)
Chronic issue. Can use Flexeril PRN and monitor for drowsiness

## 2021-11-17 NOTE — Assessment & Plan Note (Signed)
Patient reports that it bothers him only at night. Advised patient that it may be arthritis or overuse, and to pause weight training for a week and see if this improves the pain.

## 2021-11-17 NOTE — Progress Notes (Signed)
[facility]) Care Management  [place]   11/17/2021  LAYLA GRAMM [birth year] [phone]   2024 Medication Assistance Renewal Application Summary:  Patient was outreached by [place] Team regarding medication assistance renewal for 2024. Unable to contact patient and left a message asking to return my call. Will follow up with patient in 2-5 business days.   Thank you for allowing pharmacy to be a part of this patient's care.  Kristeen Miss, PharmD Clinical Pharmacist [facility] Cell: [phone]

## 2021-11-17 NOTE — Assessment & Plan Note (Signed)
Well-controlled. Diabetic foot exam normal today. Continue to maintain diet and exercise. Continue to take Jardiance '25mg'$ , Metformin '1000mg'$ , and Rybelsus '7mg'$  for blood sugar control. Eye doctor visit is up to date.

## 2021-11-17 NOTE — Progress Notes (Signed)
Patient seen along with medical student Zada Zhong.  I personally evaluated this patient along with the student, and verified all aspects of the history, physical exam, and medical decision making as documented by the student.  I agree with the student's documentation and have made all necessary edits.  Cidney Kirkwood, MD  

## 2021-11-17 NOTE — Patient Instructions (Signed)
Nice to see you. We will contact you with your lab results. Please take a week or 2 off from exercising her upper body and see if her left shoulder improves.

## 2021-11-17 NOTE — Assessment & Plan Note (Signed)
Seems to be stable. Continue Flexeril PRN and advised patient to monitor drowsiness on the medication and to avoid driving if sleepy.

## 2021-11-17 NOTE — Assessment & Plan Note (Signed)
Well-controlled.  He will continue lisinopril 10 mg daily and monitor blood pressures at home.

## 2021-11-17 NOTE — Progress Notes (Signed)
[name], MD Phone: [phone]  Dakota Gonzalez is a 76 y.o. male who presents today for f/u.  L shoulder pain: patient endorses recent shoulder pain. He reports the pain mainly bothers him at night. He states that he does weight training daily and this may be contributing to the shoulder pain. He notes he can move his shoulders in all directions with no issue.   Diabetes: Patient reports that he checks his blood sugars daily and they are usually in the normal range. He still takes Jardiance, Metformin, and Rybelsus. His last A1c was 6.8. He exercises daily with a home treadmill and weight training. He states that his diet consists of fruits and vegetables and minimal fried food. He just saw his eye doctor yesterday and reports no changes in vision. He denies polyuria, polydipsia, and numbness/tingling in his extremities.  Hypertension: Patient checks his blood pressures at home. He said the highest values he's seen at home was once in the 130s/70s. Otherwise his pressures have been within normal range at home. No chest pain, shortness of breath, or edema.   Back pain: patient has chronic back pain that is usually a 2-3 out of 10 most days. He says he doesn't take any medication for it and it is tolerable.   Restless legs: continues to take Flexeril PRN occasionally. Reports that he has only used two pills since the last refill. Notes some tiredness the day after taking the flexeril. He does not drive when this occurs.     Social History   Tobacco Use  Smoking Status Former  Smokeless Tobacco Never    Current Outpatient Medications on File Prior to Visit  Medication Sig Dispense Refill   aspirin 81 MG tablet Take 81 mg by mouth daily.     atorvastatin (LIPITOR) 40 MG tablet TAKE 1 TABLET(40 MG) BY MOUTH DAILY 90 tablet 1   blood glucose meter kit and supplies One touch ultra, test once daily, E11.9 1 each 0   Blood Glucose Monitoring Suppl (ONE TOUCH ULTRA 2) w/Device KIT 1  Device by Does not apply route daily as needed. 1 kit 0   Coenzyme Q10 (COQ-10) 100 MG CAPS Take 1 tablet by mouth daily.     cyclobenzaprine (FLEXERIL) 10 MG tablet Take 1 tablet (10 mg total) by mouth every 8 (eight) hours as needed for muscle spasms (do not drive while using, may make you drowsy). 20 tablet 0   docusate sodium (COLACE) 100 MG capsule Take 100 mg by mouth daily as needed for mild constipation.     empagliflozin (JARDIANCE) 25 MG TABS tablet Take 1 tablet (25 mg total) by mouth daily. 90 tablet 3   finasteride (PROSCAR) 5 MG tablet TAKE 1 TABLET(5 MG) BY MOUTH DAILY 90 tablet 1   Lancets (ONETOUCH DELICA PLUS BCWUGQ91Q) MISC USE TO CHECK BLOOD SUGAR TWICE DAILY AS DIRECTED 200 each 5   Lancets Misc. MISC ONETOUCH DELICA LANCETS FINE MISC check blood sugar twice daily 200 each 5   lisinopril (ZESTRIL) 10 MG tablet TAKE 1 TABLET(10 MG) BY MOUTH DAILY 90 tablet 3   metFORMIN (GLUCOPHAGE) 500 MG tablet TAKE 2 TABLETS(1000 MG) BY MOUTH TWICE DAILY WITH A MEAL 360 tablet 3   Multiple Vitamins-Minerals (CENTRUM SILVER PO) Take 1 tablet by mouth daily.     ONETOUCH ULTRA test strip TEST TWICE DAILY AS DIRECTED 200 strip 5   Semaglutide (RYBELSUS) 7 MG TABS Take 7 mg by mouth daily. 30 tablet 0   tamsulosin (FLOMAX)  0.4 MG CAPS capsule TAKE 1 CAPSULE(0.4 MG) BY MOUTH DAILY 90 capsule 3   No current facility-administered medications on file prior to visit.     ROS see history of present illness  Objective  Vitals:   11/17/21 0936 11/17/21 0959  BP: (!) 140/80 118/80  Pulse: 87   Temp: 98.6 F (37 C)   SpO2: 97%     BP Readings from Last 3 Encounters:  11/17/21 118/80  08/30/21 128/82  05/17/21 130/70   Wt Readings from Last 3 Encounters:  11/17/21 189 lb 6.4 oz (85.9 kg)  05/17/21 191 lb (86.6 kg)  05/16/21 192 lb (87.1 kg)    Physical Exam Constitutional:      Appearance: Normal appearance.  HENT:     Head: Normocephalic and atraumatic.  Cardiovascular:      Rate and Rhythm: Normal rate and regular rhythm.  Pulmonary:     Effort: Pulmonary effort is normal.     Breath sounds: Normal breath sounds.  Musculoskeletal:     Comments: Bilateral shoulders full active and passive range of motion. No pain with elevation or abduction/adduction. Slightly tender to palpation at deltoid. Negative empty can, negative speeds bilaterally.   Skin:    General: Skin is warm and dry.  Neurological:     Mental Status: He is alert.  Psychiatric:        Mood and Affect: Mood normal.    Diabetic Foot Exam - Simple   Simple Foot Form Visual Inspection See comments: Yes Sensation Testing Intact to touch and monofilament testing bilaterally: Yes Pulse Check Posterior Tibialis and Dorsalis pulse intact bilaterally: Yes Comments Onychomycosis bilaterally, otherwise no deformities, ulcerations, or skin breakdown      Assessment/Plan: Please see individual problem list.  Problem List Items Addressed This Visit       Cardiovascular and Mediastinum   HTN (hypertension) - Primary (Chronic)    Well-controlled.  He will continue lisinopril 10 mg daily and monitor blood pressures at home.      Relevant Orders   Basic Metabolic Panel (BMET)     Endocrine   Diabetes mellitus type 2, controlled ([place]) (Chronic)    Well-controlled. Diabetic foot exam normal today. Continue to maintain diet and exercise. Continue to take Jardiance 48m, Metformin 10072m and Rybelsus 2m46mor blood sugar control. Eye doctor visit is up to date.       Relevant Orders   HgB A1c     Other   Chronic back pain (Chronic)    Seems to be stable. Continue Flexeril PRN and advised patient to monitor drowsiness on the medication and to avoid driving if sleepy.      Restless legs (Chronic)    Chronic issue. Can use Flexeril PRN and monitor for drowsiness      Left shoulder pain    Patient reports that it bothers him only at night. Advised patient that it may be arthritis or overuse,  and to pause weight training for a week and see if this improves the pain.        Follow-up in 6 months.   ZadMarisa Cyphersedical Student [employer]

## 2021-11-27 ENCOUNTER — Telehealth: Payer: Self-pay

## 2021-11-27 NOTE — Progress Notes (Signed)
[facility]) Care Management  [place]   11/27/2021  Dakota Gonzalez Oct 11, 1945 [id number]   2024 Medication Assistance Renewal Application Summary:  Patient was outreached by [place] Team regarding medication assistance renewal for 2024. Verified address, anticipated insurance for 2024, and income has not changed. Patient remains interested in PAP for 2024 for ***, (no other new medications were/ please see below for other medications) identified for medication assistance.    Medication Review Findings:  *** *** ***  Medication Assistance Findings:  {Medication assistance THN:22160}     Additional medication assistance options reviewed with patient as warranted:  {Additional mediction assistance options:21932}  Plan: I will route patient assistance letter to [place] technician who will coordinate patient assistance program application process for medications listed above.  [facility] pharmacy technician will assist with obtaining all required documents from both patient and provider(s) and submit application(s) once completed.    Thank you for allowing pharmacy to be a part of this patient's care.

## 2021-12-12 ENCOUNTER — Other Ambulatory Visit: Payer: Self-pay

## 2021-12-12 DIAGNOSIS — E1165 Type 2 diabetes mellitus with hyperglycemia: Secondary | ICD-10-CM

## 2021-12-12 MED ORDER — EMPAGLIFLOZIN 25 MG PO TABS: 25.0000 mg | ORAL_TABLET | Freq: Every day | ORAL | 3 refills | Status: DC

## 2021-12-13 ENCOUNTER — Encounter: Payer: Self-pay | Admitting: Family Medicine

## 2021-12-14 ENCOUNTER — Encounter: Payer: Self-pay | Admitting: Family Medicine

## 2021-12-14 ENCOUNTER — Other Ambulatory Visit: Payer: Self-pay | Admitting: Pharmacist

## 2021-12-14 ENCOUNTER — Telehealth: Payer: Self-pay | Admitting: Pharmacy Technician

## 2021-12-14 DIAGNOSIS — E1165 Type 2 diabetes mellitus with hyperglycemia: Secondary | ICD-10-CM

## 2021-12-14 DIAGNOSIS — Z596 Low income: Secondary | ICD-10-CM

## 2021-12-14 MED ORDER — EMPAGLIFLOZIN 25 MG PO TABS: 25.0000 mg | ORAL_TABLET | Freq: Every day | ORAL | 3 refills | Status: AC

## 2021-12-14 NOTE — Progress Notes (Signed)
Care Coordination Call  Received message from CPhT. Patient is still enrolled in [place] assistance from [employer] (who dispenses with [place]) through the end of the calendar year. Refills need to be sent to [facility]. Susy Frizzle, CPhT will collaborate with patient to reapply for assistance for 2024.   Order for Jardiance sent to [place].   Sharee Pimple will call patient to notify.   Catie Hedwig Morton, PharmD, [place], [facility] [phone]

## 2021-12-14 NOTE — Progress Notes (Signed)
[facility])                                            [place] Team    12/14/2021  RASMUS PREUSSER 12/28/1945 [id number]                Medication Assistance Referral-FOR 2024 RE ENROLLMENT  Referral From:  [facility] R[facility]  Kristeen Miss  Medication/Company: Vania Rea / [place] Patient application portion:  Mailed Provider application portion: Faxed  to Dr. [name] Provider address/fax verified via: Office website  Medication/Company: [employer] Reasons / [employer] Patient application portion:  Mailed Provider application portion: Faxed  to Dr. [name] Provider address/fax verified via: Office website   Loula Marcella P. Zaniel Marineau, [place]  [phone]

## 2021-12-18 ENCOUNTER — Telehealth: Payer: Self-pay

## 2021-12-18 NOTE — Telephone Encounter (Signed)
Dakota Gonzalez called from [facility] to state patient will be bringing medication assistance paperwork in on 12/19/2021 and she would like [place] to please fax this to her at [phone].

## 2021-12-21 ENCOUNTER — Other Ambulatory Visit: Payer: Self-pay | Admitting: Family Medicine

## 2021-12-24 ENCOUNTER — Telehealth: Payer: Self-pay | Admitting: Pharmacy Technician

## 2021-12-24 DIAGNOSIS — Z596 Low income: Secondary | ICD-10-CM

## 2021-12-24 NOTE — Progress Notes (Signed)
[facility])                                            [place] Team    12/24/2021  Dakota Gonzalez 05/24/1945 [id number]  Received both patient and provider portion(s) of patient assistance application(s) for Jardiance and Rybelsus. Faxed completed application and required documents into [employer] and [employer].    Keenon Leitzel P. Hang Ammon, [place]  [phone]

## 2022-01-24 ENCOUNTER — Other Ambulatory Visit: Payer: Self-pay | Admitting: Family Medicine

## 2022-01-24 DIAGNOSIS — E785 Hyperlipidemia, unspecified: Secondary | ICD-10-CM

## 2022-01-30 ENCOUNTER — Telehealth: Payer: Self-pay | Admitting: Pharmacy Technician

## 2022-01-30 DIAGNOSIS — Z596 Low income: Secondary | ICD-10-CM

## 2022-01-30 NOTE — Progress Notes (Signed)
[facility])                                            [place] Team    01/30/2022  TOREY REINARD Sep 06, 1945 [id number]  Care coordination call placed to [place] in regard to [place] application   Spoke to [place] who informs patient is APPROVED /01/2022-01/15/23. Patient will need to call [employer] as he needs refills and then the medication will be delivered to his home address.  Sundy Houchins P. Shamiya Demeritt, [place]  [phone]

## 2022-02-01 ENCOUNTER — Telehealth: Payer: Self-pay | Admitting: Pharmacy Technician

## 2022-02-01 DIAGNOSIS — Z596 Low income: Secondary | ICD-10-CM

## 2022-02-01 NOTE — Progress Notes (Signed)
[facility])                                            [place] Team    02/01/2022  COLMAN BIRDWELL [birth year] [id number]  Care coordination call placed to [place] in regard to Rybelsus application.  Spoke to Opal Sidles who informs patient is APPROVED 01/26/22-01/15/23. Medication will auto fill and ship to provider's based on last time it was filled in 2023.  Westlee Devita P. Asiya Cutbirth, [place]  [phone]

## 2022-02-06 ENCOUNTER — Telehealth: Payer: Self-pay

## 2022-02-06 NOTE — Telephone Encounter (Signed)
Called and spoke with patient and informed him that his Rybelsus came into the office and is ready and available for pickup and he understood.  Sims Laday,cma

## 2022-02-16 ENCOUNTER — Telehealth: Payer: Self-pay

## 2022-02-16 ENCOUNTER — Other Ambulatory Visit: Payer: Self-pay

## 2022-02-16 DIAGNOSIS — E1159 Type 2 diabetes mellitus with other circulatory complications: Secondary | ICD-10-CM

## 2022-02-16 MED ORDER — RYBELSUS 7 MG PO TABS: 7.0000 mg | ORAL_TABLET | Freq: Every day | ORAL | 0 refills | Status: AC

## 2022-02-16 NOTE — Telephone Encounter (Signed)
PAP form is in the sign basket for provider signature, refill ordered.  [name],cma

## 2022-02-19 NOTE — Telephone Encounter (Signed)
Signed. Please fax.

## 2022-02-19 NOTE — Telephone Encounter (Signed)
Faxed the form to [employer] [facility] and confirmation was given.  Taiana Temkin,cma

## 2022-03-27 ENCOUNTER — Telehealth: Payer: Self-pay

## 2022-03-27 NOTE — Telephone Encounter (Signed)
Called Patient to let him know that we received 3 boxes of Rybelsus 7 mg tablets for him. Patient is to take 1 tablet by mouth daily.

## 2022-05-18 ENCOUNTER — Encounter: Payer: Self-pay | Admitting: Family Medicine

## 2022-05-18 ENCOUNTER — Ambulatory Visit (INDEPENDENT_AMBULATORY_CARE_PROVIDER_SITE_OTHER): Payer: Medicare Other | Admitting: Family Medicine

## 2022-05-18 VITALS — BP 128/78 | HR 83 | Temp 98.0°F | Ht 68.0 in | Wt 190.6 lb

## 2022-05-18 DIAGNOSIS — N400 Enlarged prostate without lower urinary tract symptoms: Secondary | ICD-10-CM

## 2022-05-18 DIAGNOSIS — E785 Hyperlipidemia, unspecified: Secondary | ICD-10-CM | POA: Diagnosis not present

## 2022-05-18 DIAGNOSIS — B351 Tinea unguium: Secondary | ICD-10-CM | POA: Diagnosis not present

## 2022-05-18 DIAGNOSIS — E1159 Type 2 diabetes mellitus with other circulatory complications: Secondary | ICD-10-CM | POA: Diagnosis not present

## 2022-05-18 DIAGNOSIS — I10 Essential (primary) hypertension: Secondary | ICD-10-CM | POA: Diagnosis not present

## 2022-05-18 DIAGNOSIS — Z7984 Long term (current) use of oral hypoglycemic drugs: Secondary | ICD-10-CM

## 2022-05-18 LAB — COMPREHENSIVE METABOLIC PANEL
ALT: 19 U/L (ref 0–53)
AST: 19 U/L (ref 0–37)
Albumin: 4.7 g/dL (ref 3.5–5.2)
Alkaline Phosphatase: 62 U/L (ref 39–117)
BUN: 12 mg/dL (ref 6–23)
CO2: 29 mEq/L (ref 19–32)
Calcium: 10.3 mg/dL (ref 8.4–10.5)
Chloride: 101 mEq/L (ref 96–112)
Creatinine, Ser: 0.85 mg/dL (ref 0.40–1.50)
GFR: 84.78 mL/min (ref >60.00)
Glucose, Bld: 117 mg/dL — ABNORMAL HIGH (ref 70–99)
Potassium: 4.1 mEq/L (ref 3.5–5.1)
Sodium: 140 mEq/L (ref 135–145)
Total Bilirubin: 1.3 mg/dL — ABNORMAL HIGH (ref 0.2–1.2)
Total Protein: 7.5 g/dL (ref 6.0–8.3)

## 2022-05-18 LAB — MICROALBUMIN / CREATININE URINE RATIO
Creatinine,U: 26.8 mg/dL
Microalb Creat Ratio: 2.6 mg/g (ref 0.0–30.0)
Microalb, Ur: <0.7 mg/dL (ref 0.0–1.9)

## 2022-05-18 LAB — LIPID PANEL
Cholesterol: 117 mg/dL (ref 0–200)
HDL: 50.6 mg/dL (ref >39.00)
LDL Cholesterol: 40 mg/dL (ref 0–99)
NonHDL: 65.96
Total CHOL/HDL Ratio: 2
Triglycerides: 131 mg/dL (ref 0.0–149.0)
VLDL: 26.2 mg/dL (ref 0.0–40.0)

## 2022-05-18 LAB — HEMOGLOBIN A1C: Hgb A1c MFr Bld: 7 % — ABNORMAL HIGH (ref 4.6–6.5)

## 2022-05-18 NOTE — Assessment & Plan Note (Signed)
Chronic issue.  Well-controlled.  Continue Proscar 5 mg daily and Flomax 0.4 mg daily.

## 2022-05-18 NOTE — Assessment & Plan Note (Signed)
Chronic issue.  Check lipid panel.  Continue Lipitor 40 mg daily.

## 2022-05-18 NOTE — Progress Notes (Signed)
Marikay Alar, MD Phone: [phone]  Dakota Gonzalez is a 77 y.o. male who presents today for f/u.  DIABETES Disease Monitoring: Blood Sugar ranges-84-140 Polyuria/phagia/dipsia- no      Optho- UTD Medications: Compliance- taking jardiance, metformin, rybelsus Hypoglycemic symptoms- no  HYPERTENSION Disease Monitoring Home BP Monitoring 110-120s/65-70 Chest pain- no    Dyspnea- no Medications Compliance-  taking lisinopril.   Edema- no BMET    Component Value Date/Time   NA 139 11/17/2021 1002   NA 139 12/15/2012 0806   K 3.9 11/17/2021 1002   K 4.1 12/15/2012 0806   CL 102 11/17/2021 1002   CL 107 12/15/2012 0806   CO2 29 11/17/2021 1002   CO2 28 12/15/2012 0806   GLUCOSE 108 (H) 11/17/2021 1002   GLUCOSE 138 (H) 12/15/2012 0806   BUN 13 11/17/2021 1002   BUN 11 12/15/2012 0806   CREATININE 0.76 11/17/2021 1002   CREATININE 1.11 01/14/2020 1422   CALCIUM 9.8 11/17/2021 1002   CALCIUM 9.5 12/15/2012 0806   GFRNONAA >60 11/28/2014 1135   GFRNONAA >60 12/15/2012 0806   GFRAA >60 11/28/2014 1135   GFRAA >60 12/15/2012 0806   BPH: Strain- no Flow- good Frequency- no Urgency- no Nocturia- 1x typically Emptying bladder- fully Medication- proscar, flomax   Social History   Tobacco Use  Smoking Status Former  Smokeless Tobacco Never    Current Outpatient Medications on File Prior to Visit  Medication Sig Dispense Refill   aspirin 81 MG tablet Take 81 mg by mouth daily.     atorvastatin (LIPITOR) 40 MG tablet TAKE 1 TABLET(40 MG) BY MOUTH DAILY 90 tablet 1   blood glucose meter kit and supplies One touch ultra, test once daily, E11.9 1 each 0   Blood Glucose Monitoring Suppl (ONE TOUCH ULTRA 2) w/Device KIT 1 Device by Does not apply route daily as needed. 1 kit 0   Coenzyme Q10 (COQ-10) 100 MG CAPS Take 1 tablet by mouth daily.     cyclobenzaprine (FLEXERIL) 10 MG tablet Take 1 tablet (10 mg total) by mouth every 8 (eight) hours as needed for muscle  spasms (do not drive while using, may make you drowsy). 20 tablet 0   docusate sodium (COLACE) 100 MG capsule Take 100 mg by mouth daily as needed for mild constipation.     empagliflozin (JARDIANCE) 25 MG TABS tablet Take 1 tablet (25 mg total) by mouth daily. 90 tablet 3   finasteride (PROSCAR) 5 MG tablet TAKE 1 TABLET(5 MG) BY MOUTH DAILY 90 tablet 1   Lancets (ONETOUCH DELICA PLUS LANCET33G) MISC USE TO CHECK BLOOD SUGAR TWICE DAILY AS DIRECTED 200 each 5   Lancets Misc. MISC ONETOUCH DELICA LANCETS FINE MISC check blood sugar twice daily 200 each 5   lisinopril (ZESTRIL) 10 MG tablet TAKE 1 TABLET(10 MG) BY MOUTH DAILY 90 tablet 3   metFORMIN (GLUCOPHAGE) 500 MG tablet TAKE 2 TABLETS(1000 MG) BY MOUTH TWICE DAILY WITH A MEAL 360 tablet 3   Multiple Vitamins-Minerals (CENTRUM SILVER PO) Take 1 tablet by mouth daily.     ONETOUCH ULTRA test strip TEST TWICE DAILY AS DIRECTED 200 strip 5   Semaglutide (RYBELSUS) 7 MG TABS Take 1 tablet (7 mg total) by mouth daily. 120 tablet 0   tamsulosin (FLOMAX) 0.4 MG CAPS capsule TAKE 1 CAPSULE(0.4 MG) BY MOUTH DAILY 90 capsule 3   No current facility-administered medications on file prior to visit.     ROS see history of present illness  Objective  Physical Exam Vitals:   05/18/22 0823 05/18/22 0843  BP: 132/80 128/78  Pulse: 83   Temp: 98 F (36.7 C)   SpO2: 98%     BP Readings from Last 3 Encounters:  05/18/22 128/78  11/17/21 118/80  08/30/21 128/82   Wt Readings from Last 3 Encounters:  05/18/22 190 lb 9.6 oz (86.5 kg)  11/17/21 189 lb 6.4 oz (85.9 kg)  05/17/21 191 lb (86.6 kg)    Physical Exam Constitutional:      General: He is not in acute distress.    Appearance: He is not diaphoretic.  Cardiovascular:     Rate and Rhythm: Normal rate and regular rhythm.     Heart sounds: Normal heart sounds.  Pulmonary:     Effort: Pulmonary effort is normal.     Breath sounds: Normal breath sounds.  Musculoskeletal:     Right  lower leg: No edema.     Left lower leg: No edema.  Skin:    General: Skin is warm and dry.  Neurological:     Mental Status: He is alert.    Diabetic Foot Exam - Simple   Simple Foot Form Diabetic Foot exam was performed with the following findings: Yes 05/18/2022  8:39 AM  Visual Inspection No deformities, no ulcerations, no other skin breakdown bilaterally: Yes See comments: Yes Sensation Testing Intact to touch and monofilament testing bilaterally: Yes Pulse Check Posterior Tibialis and Dorsalis pulse intact bilaterally: Yes Comments Has onychomycosis all toes      Assessment/Plan: Please see individual problem list.  Controlled type 2 diabetes mellitus with other circulatory complication, without long-term current use of insulin (HCC) Assessment & Plan: Chronic issue.  Adequately controlled.  Check A1c.  Continue Jardiance 25 mg daily, metformin 1000 mg twice daily, and Rybelsus 7 mg daily.  Orders: -     Microalbumin / creatinine urine ratio -     Hemoglobin A1c  Primary hypertension Assessment & Plan: Chronic issue.  Adequately controlled at home.  Patient will continue lisinopril 10 mg daily.  Orders: -     Comprehensive metabolic panel  Onychomycosis Assessment & Plan: Chronic issue.  Monitor.   Hyperlipidemia, unspecified hyperlipidemia type Assessment & Plan: Chronic issue.  Check lipid panel.  Continue Lipitor 40 mg daily.  Orders: -     Comprehensive metabolic panel -     Lipid panel  Benign prostatic hyperplasia without lower urinary tract symptoms Assessment & Plan: Chronic issue.  Well-controlled.  Continue Proscar 5 mg daily and Flomax 0.4 mg daily.     Return in about 6 months (around 11/18/2022) for physical.   Marikay Alar, MD [facility]

## 2022-05-18 NOTE — Assessment & Plan Note (Signed)
Chronic issue.  Adequately controlled at home.  Patient will continue lisinopril 10 mg daily.

## 2022-05-18 NOTE — Assessment & Plan Note (Signed)
Chronic issue.  Monitor.

## 2022-05-18 NOTE — Assessment & Plan Note (Signed)
Chronic issue.  Adequately controlled.  Check A1c.  Continue Jardiance 25 mg daily, metformin 1000 mg twice daily, and Rybelsus 7 mg daily.

## 2022-05-19 ENCOUNTER — Encounter: Payer: Self-pay | Admitting: Family Medicine

## 2022-05-31 ENCOUNTER — Telehealth: Payer: Self-pay | Admitting: Family Medicine

## 2022-05-31 NOTE — Telephone Encounter (Signed)
Contacted Dakota Gonzalez to schedule their annual wellness visit. Appointment made for 07/10/2022.  Verlee Rossetti; Care Guide Ambulatory Clinical Support [place] [facility] Direct Dial: [phone]

## 2022-06-04 ENCOUNTER — Telehealth: Payer: Self-pay

## 2022-06-04 NOTE — Telephone Encounter (Signed)
Called Patient to let him know that we received 4 boxes of Rybelsus 7 mg tablets and that they are ready for pick up.

## 2022-06-14 ENCOUNTER — Encounter: Payer: Self-pay | Admitting: Family Medicine

## 2022-06-15 ENCOUNTER — Other Ambulatory Visit: Payer: Self-pay

## 2022-06-15 DIAGNOSIS — E1159 Type 2 diabetes mellitus with other circulatory complications: Secondary | ICD-10-CM

## 2022-06-15 MED ORDER — ONETOUCH ULTRA VI STRP: ORAL_STRIP | 5 refills | Status: DC

## 2022-06-18 ENCOUNTER — Encounter: Payer: Self-pay | Admitting: Family Medicine

## 2022-06-18 DIAGNOSIS — E1159 Type 2 diabetes mellitus with other circulatory complications: Secondary | ICD-10-CM

## 2022-06-18 MED ORDER — ONETOUCH ULTRA VI STRP: ORAL_STRIP | 5 refills | Status: DC

## 2022-06-21 ENCOUNTER — Telehealth: Payer: Self-pay | Admitting: Family Medicine

## 2022-06-21 ENCOUNTER — Other Ambulatory Visit: Payer: Self-pay

## 2022-06-21 DIAGNOSIS — E1159 Type 2 diabetes mellitus with other circulatory complications: Secondary | ICD-10-CM

## 2022-06-21 MED ORDER — METFORMIN HCL 500 MG PO TABS
ORAL_TABLET | ORAL | 3 refills | Status: DC
Start: 2022-06-21 — End: 2023-05-28

## 2022-06-21 NOTE — Telephone Encounter (Signed)
Metformin was sent to [facility].

## 2022-06-21 NOTE — Telephone Encounter (Signed)
Patient has only 2 days left

## 2022-06-21 NOTE — Telephone Encounter (Signed)
Prescription Request  06/21/2022  LOV: 05/18/2022  What is the name of the medication or equipment? metFORMIN   Have you contacted your pharmacy to request a refill? Yes   Which pharmacy would you like this sent to? [facility]   Patient notified that their request is being sent to the clinical staff for review and that they should receive a response within 2 business days.   Please advise at Mobile [phone] (mobile)

## 2022-06-24 ENCOUNTER — Other Ambulatory Visit: Payer: Self-pay | Admitting: Family Medicine

## 2022-07-08 ENCOUNTER — Other Ambulatory Visit: Payer: Self-pay | Admitting: Family Medicine

## 2022-07-10 ENCOUNTER — Ambulatory Visit (INDEPENDENT_AMBULATORY_CARE_PROVIDER_SITE_OTHER): Payer: Medicare Other | Admitting: *Deleted

## 2022-07-10 VITALS — Ht 68.0 in | Wt 188.0 lb

## 2022-07-10 DIAGNOSIS — Z0000 Encounter for general adult medical examination without abnormal findings: Secondary | ICD-10-CM

## 2022-07-10 NOTE — Patient Instructions (Signed)
Mr. Dakota Gonzalez , Thank you for taking time to come for your Medicare Wellness Visit. I appreciate your ongoing commitment to your health goals. Please review the following plan we discussed and let me know if I can assist you in the future.   These are the goals we discussed:  Goals       DIET - REDUCE SUGAR INTAKE (pt-stated)      Low carb diet      Maintain Healthy Lifestyle       Patient Stated      Wants to make sure that he exercises every day using equipment at home. Do a better job controlling is blood sugar      Patient Stated      Keep moving and exercise on a regular basis, do word puzzles to keep mind sharp        This is a list of the screening recommended for you and due dates:  Health Maintenance  Topic Date Due   COVID-19 Vaccine (6 - 2023-24 season) 12/24/2021   Flu Shot  08/16/2022   Eye exam for diabetics  11/17/2022   Hemoglobin A1C  11/18/2022   Yearly kidney function blood test for diabetes  05/18/2023   Yearly kidney health urinalysis for diabetes  05/18/2023   Complete foot exam   05/18/2023   Medicare Annual Wellness Visit  07/10/2023   DTaP/Tdap/Td vaccine (2 - Td or Tdap) 03/21/2027   Pneumonia Vaccine  Completed   Hepatitis C Screening  Completed   Zoster (Shingles) Vaccine  Completed   HPV Vaccine  Aged Out   Cologuard (Stool DNA test)  Discontinued    Advanced directives: will bring to office  Conditions/risks identified: none  Next appointment: Follow up in one year for your annual wellness visit. 07/15/23 @ 8:15 telephone  Preventive Care 65 Years and Older, Male  Preventive care refers to lifestyle choices and visits with your health care provider that can promote health and wellness. What does preventive care include? A yearly physical exam. This is also called an annual well check. Dental exams once or twice a year. Routine eye exams. Ask your health care provider how often you should have your eyes checked. Personal lifestyle choices,  including: Daily care of your teeth and gums. Regular physical activity. Eating a healthy diet. Avoiding tobacco and drug use. Limiting alcohol use. Practicing safe sex. Taking low doses of aspirin every day. Taking vitamin and mineral supplements as recommended by your health care provider. What happens during an annual well check? The services and screenings done by your health care provider during your annual well check will depend on your age, overall health, lifestyle risk factors, and family history of disease. Counseling  Your health care provider may ask you questions about your: Alcohol use. Tobacco use. Drug use. Emotional well-being. Home and relationship well-being. Sexual activity. Eating habits. History of falls. Memory and ability to understand (cognition). Work and work Astronomer. Screening  You may have the following tests or measurements: Height, weight, and BMI. Blood pressure. Lipid and cholesterol levels. These may be checked every 5 years, or more frequently if you are over 35 years old. Skin check. Lung cancer screening. You may have this screening every year starting at age 40 if you have a 30-pack-year history of smoking and currently smoke or have quit within the past 15 years. Fecal occult blood test (FOBT) of the stool. You may have this test every year starting at age 38. Flexible sigmoidoscopy or colonoscopy.  You may have a sigmoidoscopy every 5 years or a colonoscopy every 10 years starting at age 53. Prostate cancer screening. Recommendations will vary depending on your family history and other risks. Hepatitis C blood test. Hepatitis B blood test. Sexually transmitted disease (STD) testing. Diabetes screening. This is done by checking your blood sugar (glucose) after you have not eaten for a while (fasting). You may have this done every 1-3 years. Abdominal aortic aneurysm (AAA) screening. You may need this if you are a current or former  smoker. Osteoporosis. You may be screened starting at age 92 if you are at high risk. Talk with your health care provider about your test results, treatment options, and if necessary, the need for more tests. Vaccines  Your health care provider may recommend certain vaccines, such as: Influenza vaccine. This is recommended every year. Tetanus, diphtheria, and acellular pertussis (Tdap, Td) vaccine. You may need a Td booster every 10 years. Zoster vaccine. You may need this after age 58. Pneumococcal 13-valent conjugate (PCV13) vaccine. One dose is recommended after age 63. Pneumococcal polysaccharide (PPSV23) vaccine. One dose is recommended after age 32. Talk to your health care provider about which screenings and vaccines you need and how often you need them. This information is not intended to replace advice given to you by your health care provider. Make sure you discuss any questions you have with your health care provider. Document Released: 01/28/2015 Document Revised: 09/21/2015 Document Reviewed: 11/02/2014 Elsevier Interactive Patient Education  2017 [place] Prevention in the Home Falls can cause injuries. They can happen to people of all ages. There are many things you can do to make your home safe and to help prevent falls. What can I do on the outside of my home? Regularly fix the edges of walkways and driveways and fix any cracks. Remove anything that might make you trip as you walk through a door, such as a raised step or threshold. Trim any bushes or trees on the path to your home. Use bright outdoor lighting. Clear any walking paths of anything that might make someone trip, such as rocks or tools. Regularly check to see if handrails are loose or broken. Make sure that both sides of any steps have handrails. Any raised decks and porches should have guardrails on the edges. Have any leaves, snow, or ice cleared regularly. Use sand or salt on walking paths during  winter. Clean up any spills in your garage right away. This includes oil or grease spills. What can I do in the bathroom? Use night lights. Install grab bars by the toilet and in the tub and shower. Do not use towel bars as grab bars. Use non-skid mats or decals in the tub or shower. If you need to sit down in the shower, use a plastic, non-slip stool. Keep the floor dry. Clean up any water that spills on the floor as soon as it happens. Remove soap buildup in the tub or shower regularly. Attach bath mats securely with double-sided non-slip rug tape. Do not have throw rugs and other things on the floor that can make you trip. What can I do in the bedroom? Use night lights. Make sure that you have a light by your bed that is easy to reach. Do not use any sheets or blankets that are too big for your bed. They should not hang down onto the floor. Have a firm chair that has side arms. You can use this for support while you  get dressed. Do not have throw rugs and other things on the floor that can make you trip. What can I do in the kitchen? Clean up any spills right away. Avoid walking on wet floors. Keep items that you use a lot in easy-to-reach places. If you need to reach something above you, use a strong step stool that has a grab bar. Keep electrical cords out of the way. Do not use floor polish or wax that makes floors slippery. If you must use wax, use non-skid floor wax. Do not have throw rugs and other things on the floor that can make you trip. What can I do with my stairs? Do not leave any items on the stairs. Make sure that there are handrails on both sides of the stairs and use them. Fix handrails that are broken or loose. Make sure that handrails are as long as the stairways. Check any carpeting to make sure that it is firmly attached to the stairs. Fix any carpet that is loose or worn. Avoid having throw rugs at the top or bottom of the stairs. If you do have throw rugs,  attach them to the floor with carpet tape. Make sure that you have a light switch at the top of the stairs and the bottom of the stairs. If you do not have them, ask someone to add them for you. What else can I do to help prevent falls? Wear shoes that: Do not have high heels. Have rubber bottoms. Are comfortable and fit you well. Are closed at the toe. Do not wear sandals. If you use a stepladder: Make sure that it is fully opened. Do not climb a closed stepladder. Make sure that both sides of the stepladder are locked into place. Ask someone to hold it for you, if possible. Clearly mark and make sure that you can see: Any grab bars or handrails. First and last steps. Where the edge of each step is. Use tools that help you move around (mobility aids) if they are needed. These include: Canes. Walkers. Scooters. Crutches. Turn on the lights when you go into a dark area. Replace any light bulbs as soon as they burn out. Set up your furniture so you have a clear path. Avoid moving your furniture around. If any of your floors are uneven, fix them. If there are any pets around you, be aware of where they are. Review your medicines with your doctor. Some medicines can make you feel dizzy. This can increase your chance of falling. Ask your doctor what other things that you can do to help prevent falls. This information is not intended to replace advice given to you by your health care provider. Make sure you discuss any questions you have with your health care provider. Document Released: 10/28/2008 Document Revised: 06/09/2015 Document Reviewed: 02/05/2014 Elsevier Interactive Patient Education  2017 [employer].

## 2022-07-10 NOTE — Progress Notes (Signed)
Subjective:   Dakota Gonzalez is a 77 y.o. male who presents for Medicare Annual/Subsequent preventive examination.  Visit Complete: Virtual  I connected with  [name] on 07/10/22 by a audio enabled telemedicine application and verified that I am speaking with the correct person using two identifiers.  Patient Location: Home  Provider Location: Office/Clinic  I discussed the limitations of evaluation and management by telemedicine. The patient expressed understanding and agreed to proceed.  Patient Medicare AWV questionnaire was completed by the patient on 07/06/22; I have confirmed that all information answered by patient is correct and no changes since this date.  Review of Systems     Cardiac Risk Factors include: advanced age (>1men, >99 women);diabetes mellitus;dyslipidemia;hypertension     Objective:    Today's Vitals   07/10/22 0906  Weight: 188 lb (85.3 kg)  Height: 5\' 8"  (1.727 m)   Body mass index is 28.59 kg/m.     07/10/2022    9:32 AM 05/03/2020    9:16 AM 04/30/2019    9:18 AM 04/29/2018   11:50 AM 04/26/2017    3:25 PM 04/25/2016    3:15 PM 11/28/2014    9:58 AM  Advanced Directives  Does Patient Have a Medical Advance Directive? Yes Yes Yes Yes Yes No No  Type of Estate agent of [place];Living will Healthcare Power of [employer] of [place];Living will Living will Healthcare Power of [place];Living will    Does patient want to make changes to medical advance directive?  No - Patient declined No - Patient declined No - Patient declined No - Patient declined    Copy of Healthcare Power of Attorney in Chart? No - copy requested No - copy requested No - copy requested  No - copy requested    Would patient like information on creating a medical advance directive?      No - Patient declined     Current Medications (verified) Outpatient Encounter Medications as of 07/10/2022  Medication Sig   aspirin 81 MG tablet  Take 81 mg by mouth daily.   atorvastatin (LIPITOR) 40 MG tablet TAKE 1 TABLET(40 MG) BY MOUTH DAILY   blood glucose meter kit and supplies One touch ultra, test once daily, E11.9   Blood Glucose Monitoring Suppl (ONE TOUCH ULTRA 2) w/Device KIT 1 Device by Does not apply route daily as needed.   Coenzyme Q10 (COQ-10) 100 MG CAPS Take 1 tablet by mouth daily.   cyclobenzaprine (FLEXERIL) 10 MG tablet Take 1 tablet (10 mg total) by mouth every 8 (eight) hours as needed for muscle spasms (do not drive while using, may make you drowsy).   docusate sodium (COLACE) 100 MG capsule Take 100 mg by mouth daily as needed for mild constipation.   empagliflozin (JARDIANCE) 25 MG TABS tablet Take 1 tablet (25 mg total) by mouth daily.   finasteride (PROSCAR) 5 MG tablet TAKE 1 TABLET(5 MG) BY MOUTH DAILY   glucose blood (ONETOUCH ULTRA) test strip Test twice daily   Lancets (ONETOUCH DELICA PLUS LANCET33G) MISC USE TO CHECK BLOOD SUGAR TWICE DAILY AS DIRECTED   Lancets Misc. MISC ONETOUCH DELICA LANCETS FINE MISC check blood sugar twice daily   lisinopril (ZESTRIL) 10 MG tablet TAKE 1 TABLET(10 MG) BY MOUTH DAILY   metFORMIN (GLUCOPHAGE) 500 MG tablet Take 2 tablets daily by mouth twice daily with a meal   Multiple Vitamins-Minerals (CENTRUM SILVER PO) Take 1 tablet by mouth daily.   tamsulosin (FLOMAX) 0.4 MG  CAPS capsule TAKE 1 CAPSULE(0.4 MG) BY MOUTH DAILY   No facility-administered encounter medications on file as of 07/10/2022.    Allergies (verified) Aspartame   History: Past Medical History:  Diagnosis Date   Allergy    Arthritis    Asthma    Depression    Diabetes mellitus 2007   Elevated PSA 07/09/2011   Enlarged prostate    GERD (gastroesophageal reflux disease)    Headache(784.0)    Hyperlipidemia    Hypertension    Migraine    Sinusitis    Past Surgical History:  Procedure Laterality Date   BACK SURGERY  10/2003   ruptured disc, L4-5, [facility]   HEMANGIOMA EXCISION   2014   right upper arm, Dr. Lorretta Harp   PROSTATE BIOPSY  2014   Dr. Achilles Dunk   TONSILLECTOMY AND ADENOIDECTOMY  1960   Family History  Problem Relation Age of Onset   Stroke Mother    Kidney disease Mother    Diabetes Mother    Cancer Father    Stroke Father    Kidney disease Father    Diabetes Sister    Cancer Other        breast   Social History   Socioeconomic History   Marital status: Widowed    Spouse name: Not on file   Number of children: Not on file   Years of education: Not on file   Highest education level: Some college, no degree  Occupational History   Not on file  Tobacco Use   Smoking status: Former   Smokeless tobacco: Never  Vaping Use   Vaping Use: Never used  Substance and Sexual Activity   Alcohol use: No   Drug use: No   Sexual activity: Never  Other Topics Concern   Not on file  Social History Narrative   Lives in [place]. 1 son lives in [place].      Work - Disabled, previously Microbiologist and railroad.      Diet - regular   Exercise - ellipitical machine at home   Social Determinants of Health   Financial Resource Strain: Low Risk  (07/10/2022)   Overall Financial Resource Strain (CARDIA)    Difficulty of Paying Living Expenses: Not hard at all  Food Insecurity: No Food Insecurity (07/10/2022)   Hunger Vital Sign    Worried About Running Out of Food in the Last Year: Never true    Ran Out of Food in the Last Year: Never true  Transportation Needs: No Transportation Needs (07/10/2022)   PRAPARE - Administrator, Civil Service (Medical): No    Lack of Transportation (Non-Medical): No  Physical Activity: Insufficiently Active (07/10/2022)   Exercise Vital Sign    Days of Exercise per Week: 6 days    Minutes of Exercise per Session: 10 min  Stress: No Stress Concern Present (07/10/2022)   [employer] of Occupational Health - Occupational Stress Questionnaire    Feeling of Stress : Not at all  Social Connections:  Moderately Integrated (07/10/2022)   Social Connection and Isolation Panel [NHANES]    Frequency of Communication with Friends and Family: More than three times a week    Frequency of Social Gatherings with Friends and Family: More than three times a week    Attends Religious Services: More than 4 times per year    Active Member of [employer] or Organizations: Yes    Attends Banker Meetings: More than 4 times per year  Marital Status: Widowed    Tobacco Counseling Counseling given: Not Answered   Clinical Intake:  Pre-visit preparation completed: Yes  Pain : No/denies pain     BMI - recorded: 0.08 Nutritional Status: BMI 25 -29 Overweight Nutritional Risks: None Diabetes: No  How often do you need to have someone help you when you read instructions, pamphlets, or other written materials from your doctor or pharmacy?: 1 - Never  Interpreter Needed?: No  Information entered by :: R. Caira Poche LPN   Activities of Daily Living    07/10/2022    9:36 AM 07/06/2022   11:00 AM  In your present state of health, do you have any difficulty performing the following activities:  Hearing? 0 0  Vision? 0 0  Difficulty concentrating or making decisions? 0 0  Walking or climbing stairs? 0 0  Dressing or bathing? 0 0  Doing errands, shopping? 0 0  Preparing Food and eating ? N N  Using the Toilet? N N  In the past six months, have you accidently leaked urine? N N  Do you have problems with loss of bowel control? N N  Managing your Medications? N N  Managing your Finances? N N  Housekeeping or managing your Housekeeping? N N    Patient Care Team: Glori Luis, MD as PCP - General (Family Medicine) Iran Ouch, MD as Consulting Physician (Cardiology)  Indicate any recent Medical Services you may have received from other than [facility] providers in the past year (date may be approximate).     Assessment:   This is a routine wellness examination for  [place].  Hearing/Vision screen Hearing Screening - Comments:: No issues Vision Screening - Comments:: readers  Dietary issues and exercise activities discussed:     Goals Addressed             This Visit's Progress    Patient Stated       Wants to make sure that he exercises every day using equipment at home. Do a better job controlling is blood sugar     Patient Stated       Keep moving and exercise on a regular basis, do word puzzles to keep mind sharp      Depression Screen    07/10/2022    9:24 AM 05/18/2022    8:24 AM 11/17/2021    9:37 AM 05/16/2021    9:10 AM 11/15/2020    9:11 AM 05/03/2020    9:15 AM 04/26/2020   12:55 PM  PHQ 2/9 Scores  PHQ - 2 Score 0 0 0 0 0 0 0  PHQ- 9 Score 0 0         Fall Risk    07/10/2022    9:22 AM 07/06/2022   11:00 AM 05/18/2022    8:24 AM 11/17/2021    9:37 AM 05/16/2021    9:09 AM  Fall Risk   Falls in the past year? 0 0 0 0 0  Number falls in past yr: 0 0 0 0 0  Injury with Fall? 0 0 0 0   Risk for fall due to : No Fall Risks  No Fall Risks No Fall Risks   Follow up Falls prevention discussed;Falls evaluation completed;Education provided  Falls evaluation completed Falls evaluation completed Falls evaluation completed    MEDICARE RISK AT HOME:  Medicare Risk at Home - 07/10/22 0923     Any stairs in or around the home? No    If so, are there  any without handrails? No    Home free of loose throw rugs in walkways, pet beds, electrical cords, etc? Yes    Adequate lighting in your home to reduce risk of falls? Yes    Life alert? No    Use of a cane, walker or w/c? No    Grab bars in the bathroom? Yes    Shower chair or bench in shower? Yes    Elevated toilet seat or a handicapped toilet? Yes              Cognitive Function:    04/25/2016    3:41 PM  MMSE - Mini Mental State Exam  Orientation to time 5  Orientation to Place 5  Registration 3  Attention/ Calculation 5  Recall 3  Language- name 2 objects 2   Language- repeat 1  Language- follow 3 step command 3  Language- read & follow direction 1  Write a sentence 1  Copy design 1  Total score 30        07/10/2022    9:32 AM 04/30/2019    9:22 AM 04/29/2018   11:55 AM 04/26/2017    3:27 PM  6CIT Screen  What Year? 0 points 0 points 0 points 0 points  What month? 0 points 0 points 0 points 0 points  What time? 0 points 0 points 0 points 0 points  Count back from 20 0 points  0 points 0 points  Months in reverse 0 points  0 points 0 points  Repeat phrase 0 points  0 points 0 points  Total Score 0 points  0 points 0 points    Immunizations Immunization History  Administered Date(s) Administered   Fluad Quad(high Dose 65+) 09/26/2018, 10/29/2021   Influenza Split 10/17/2011   Influenza, High Dose Seasonal PF 09/19/2016, 10/03/2017   Influenza,inj,Quad PF,6+ Mos 09/22/2012, 10/10/2013, 11/10/2014   Influenza-Unspecified 11/03/2017, 10/20/2019, 09/06/2020   Moderna Covid-19 Vaccine Bivalent Booster 67yrs & up 11/08/2020   Moderna SARS-COV2 Booster Vaccination 10/16/2019   Moderna Sars-Covid-2 Vaccination 02/27/2019, 03/27/2019   Pneumococcal Conjugate-13 06/12/2013   Pneumococcal Polysaccharide-23 10/17/2011   Tdap 03/20/2017   Unspecified SARS-COV-2 Vaccination 10/29/2021   Zoster Recombinat (Shingrix) 09/06/2020, 11/08/2020    TDAP status: Up to date  Flu Vaccine status: Up to date per patient had at [facility]  Pneumococcal vaccine status: Up to date  Covid-19 vaccine status: Completed vaccines  Qualifies for Shingles Vaccine? Yes   Zostavax completed  11/08/20 Shingrix Completed?: No.    Education has been provided regarding the importance of this vaccine. Patient has been advised to call insurance company to determine out of pocket expense if they have not yet received this vaccine. Advised may also receive vaccine at local pharmacy or Health Dept. Verbalized acceptance and understanding.  Screening Tests Health  Maintenance  Topic Date Due   COVID-19 Vaccine (6 - 2023-24 season) 12/24/2021   INFLUENZA VACCINE  08/16/2022   OPHTHALMOLOGY EXAM  11/17/2022   HEMOGLOBIN A1C  11/18/2022   Diabetic kidney evaluation - eGFR measurement  05/18/2023   Diabetic kidney evaluation - Urine ACR  05/18/2023   FOOT EXAM  05/18/2023   Medicare Annual Wellness (AWV)  07/10/2023   DTaP/Tdap/Td (2 - Td or Tdap) 03/21/2027   Pneumonia Vaccine 64+ Years old  Completed   Hepatitis C Screening  Completed   Zoster Vaccines- Shingrix  Completed   HPV VACCINES  Aged Out   Fecal DNA (Cologuard)  Discontinued    Health Maintenance  Health  Maintenance Due  Topic Date Due   COVID-19 Vaccine (6 - 2023-24 season) 12/24/2021    Colorectal cancer screening: Type of screening: Cologuard. Completed 05/22/21. Repeat every 3 years  Lung Cancer Screening: (Low Dose CT Chest recommended if Age 56-80 years, 20 pack-year currently smoking OR have quit w/in 15years.) does not qualify.     Additional Screening:  Hepatitis C Screening: does qualify; Completed 12/17/16  Vision Screening: Recommended annual ophthalmology exams for early detection of glaucoma and other disorders of the eye. Is the patient up to date with their annual eye exam?  Yes  Who is the provider or what is the name of the office in which the patient attends annual eye exams? [facility] If pt is not established with a provider, would they like to be referred to a provider to establish care? No .   Dental Screening: Recommended annual dental exams for proper oral hygiene  Diabetic Foot Exam: Diabetic Foot Exam: Completed 05/18/22  Community Resource Referral / Chronic Care Management: CRR required this visit?  No   CCM required this visit?  No     Plan:     I have personally reviewed and noted the following in the patient's chart:   Medical and social history Use of alcohol, tobacco or illicit drugs  Current medications and supplements  including opioid prescriptions. Patient is not currently taking opioid prescriptions. Functional ability and status Nutritional status Physical activity Advanced directives List of other physicians Hospitalizations, surgeries, and ER visits in previous 12 months Vitals Screenings to include cognitive, depression, and falls Referrals and appointments  In addition, I have reviewed and discussed with patient certain preventive protocols, quality metrics, and best practice recommendations. A written personalized care plan for preventive services as well as general preventive health recommendations were provided to patient.     Sydell Axon, LPN   [id number]/82/[id number]   After Visit Summary: (MyChart) Due to this being a telephonic visit, the after visit summary with patients personalized plan was offered to patient via MyChart   Nurse Notes: Patient is doing fine. Will discuss supplements with PCP when he comes in.

## 2022-07-31 ENCOUNTER — Other Ambulatory Visit: Payer: Self-pay | Admitting: Family Medicine

## 2022-07-31 DIAGNOSIS — E785 Hyperlipidemia, unspecified: Secondary | ICD-10-CM

## 2022-08-15 ENCOUNTER — Encounter (INDEPENDENT_AMBULATORY_CARE_PROVIDER_SITE_OTHER): Payer: Self-pay

## 2022-08-28 ENCOUNTER — Encounter: Payer: Self-pay | Admitting: Family Medicine

## 2022-08-28 ENCOUNTER — Ambulatory Visit (INDEPENDENT_AMBULATORY_CARE_PROVIDER_SITE_OTHER): Payer: Medicare Other | Admitting: Family Medicine

## 2022-08-28 VITALS — BP 126/80 | HR 81 | Temp 98.0°F | Ht 68.0 in | Wt 182.6 lb

## 2022-08-28 DIAGNOSIS — I10 Essential (primary) hypertension: Secondary | ICD-10-CM | POA: Diagnosis not present

## 2022-08-28 DIAGNOSIS — E785 Hyperlipidemia, unspecified: Secondary | ICD-10-CM | POA: Diagnosis not present

## 2022-08-28 DIAGNOSIS — Z7984 Long term (current) use of oral hypoglycemic drugs: Secondary | ICD-10-CM | POA: Diagnosis not present

## 2022-08-28 DIAGNOSIS — E1159 Type 2 diabetes mellitus with other circulatory complications: Secondary | ICD-10-CM | POA: Diagnosis not present

## 2022-08-28 LAB — POCT GLYCOSYLATED HEMOGLOBIN (HGB A1C): Hemoglobin A1C: 6.2 % — AB (ref 4.0–5.6)

## 2022-08-28 NOTE — Assessment & Plan Note (Signed)
Chronic issue.  Continue Lipitor 40 mg daily. 

## 2022-08-28 NOTE — Assessment & Plan Note (Signed)
Chronic issue.  Adequately controlled.  Continue Jardiance 25 mg daily, metformin 1000 mg twice daily, and Rybelsus 7 mg daily.

## 2022-08-28 NOTE — Progress Notes (Signed)
Marikay Alar, MD Phone: [phone]  Dakota Gonzalez is a 77 y.o. male who presents today for f/u.  HYPERTENSION Disease Monitoring: Blood pressure range-110-120/60-70 Chest pain- no      Dyspnea- no Medications: Compliance- no medications   Edema- no  DIABETES Disease Monitoring: Blood Sugar ranges-100s fasting, 140-150 2 hour post prandial Polyuria/phagia/dipsia- no      Optho- UTD Medications: Compliance- taking jardiance, rybelsus, metformin Hypoglycemic symptoms- none, though does not a couple of 62s recently  HYPERLIPIDEMIA Disease Monitoring: See symptoms for Hypertension Medications: Compliance- taking lipitor Right upper quadrant pain- no  Muscle aches- no    Social History   Tobacco Use  Smoking Status Former  Smokeless Tobacco Never    Current Outpatient Medications on File Prior to Visit  Medication Sig Dispense Refill  . aspirin 81 MG tablet Take 81 mg by mouth daily.    Marland Kitchen atorvastatin (LIPITOR) 40 MG tablet TAKE 1 TABLET(40 MG) BY MOUTH DAILY 90 tablet 1  . blood glucose meter kit and supplies One touch ultra, test once daily, E11.9 1 each 0  . Blood Glucose Monitoring Suppl (ONE TOUCH ULTRA 2) w/Device KIT 1 Device by Does not apply route daily as needed. 1 kit 0  . Coenzyme Q10 (COQ-10) 100 MG CAPS Take 1 tablet by mouth daily.    . cyclobenzaprine (FLEXERIL) 10 MG tablet Take 1 tablet (10 mg total) by mouth every 8 (eight) hours as needed for muscle spasms (do not drive while using, may make you drowsy). 20 tablet 0  . docusate sodium (COLACE) 100 MG capsule Take 100 mg by mouth daily as needed for mild constipation.    . empagliflozin (JARDIANCE) 25 MG TABS tablet Take 1 tablet (25 mg total) by mouth daily. 90 tablet 3  . finasteride (PROSCAR) 5 MG tablet TAKE 1 TABLET(5 MG) BY MOUTH DAILY 90 tablet 1  . glucose blood (ONETOUCH ULTRA) test strip Test twice daily 200 strip 5  . Lancets (ONETOUCH DELICA PLUS LANCET33G) MISC USE TO CHECK BLOOD SUGAR  TWICE DAILY AS DIRECTED 200 each 5  . Lancets Misc. MISC ONETOUCH DELICA LANCETS FINE MISC check blood sugar twice daily 200 each 5  . metFORMIN (GLUCOPHAGE) 500 MG tablet Take 2 tablets daily by mouth twice daily with a meal 360 tablet 3  . Multiple Vitamins-Minerals (CENTRUM SILVER PO) Take 1 tablet by mouth daily.    . Semaglutide (RYBELSUS) 7 MG TABS Take 7 mg by mouth daily.    . tamsulosin (FLOMAX) 0.4 MG CAPS capsule TAKE 1 CAPSULE(0.4 MG) BY MOUTH DAILY 90 capsule 3   No current facility-administered medications on file prior to visit.     ROS see history of present illness  Objective  Physical Exam Vitals:   08/28/22 1001  BP: 126/80  Pulse: 81  Temp: 98 F (36.7 C)  SpO2: 97%    BP Readings from Last 3 Encounters:  08/28/22 126/80  05/18/22 128/78  11/17/21 118/80   Wt Readings from Last 3 Encounters:  08/28/22 182 lb 9.6 oz (82.8 kg)  07/10/22 188 lb (85.3 kg)  05/18/22 190 lb 9.6 oz (86.5 kg)    Physical Exam Constitutional:      General: He is not in acute distress.    Appearance: He is not diaphoretic.  Cardiovascular:     Rate and Rhythm: Normal rate and regular rhythm.     Heart sounds: Normal heart sounds.  Pulmonary:     Effort: Pulmonary effort is normal.  Breath sounds: Normal breath sounds.  Musculoskeletal:     Right lower leg: No edema.     Left lower leg: No edema.  Skin:    General: Skin is warm and dry.  Neurological:     Mental Status: He is alert.     Assessment/Plan: Please see individual problem list.  Controlled type 2 diabetes mellitus with other circulatory complication, without long-term current use of insulin (HCC) Assessment & Plan: Chronic issue.  Adequately controlled.  Continue Jardiance 25 mg daily, metformin 1000 mg twice daily, and Rybelsus 7 mg daily.  Orders: -     POCT glycosylated hemoglobin (Hb A1C)  Primary hypertension Assessment & Plan: Chronic.  Adequately controlled.  Patient is not on  medication.  He will continue to monitor.   Hyperlipidemia, unspecified hyperlipidemia type Assessment & Plan: Chronic issue.  Continue Lipitor 40 mg daily.    Return for As scheduled.   Marikay Alar, MD [facility]

## 2022-08-28 NOTE — Assessment & Plan Note (Addendum)
Chronic.  Adequately controlled.  Patient is not on medication.  He will continue to monitor.

## 2022-08-28 NOTE — Patient Instructions (Signed)
Nice to see you. Your A1c is adequately controlled. We will continue with your current dosages of medicine and have you follow-up in 3 months as scheduled.

## 2022-10-03 ENCOUNTER — Encounter: Payer: Self-pay | Admitting: Family Medicine

## 2022-10-04 ENCOUNTER — Telehealth: Payer: Self-pay | Admitting: Family Medicine

## 2022-10-04 NOTE — Telephone Encounter (Signed)
Patient dropped off document  Patient assistance application , to be filled out by provider. Document is located in providers tray at front office.Please advise at [facility] [phone]

## 2022-10-07 ENCOUNTER — Encounter: Payer: Self-pay | Admitting: Family Medicine

## 2022-10-08 NOTE — Telephone Encounter (Signed)
Pt called about BI cares form and want to the update on it

## 2022-10-08 NOTE — Telephone Encounter (Signed)
Faxed paperwork and received an okay confirmation

## 2022-10-11 NOTE — Telephone Encounter (Signed)
Noted. Can we send it through email as requested by the patient?

## 2022-10-12 ENCOUNTER — Encounter: Payer: Self-pay | Admitting: Family Medicine

## 2022-10-12 NOTE — Telephone Encounter (Signed)
I do not have this in my basket. Can you find it? Thanks.

## 2022-10-15 ENCOUNTER — Other Ambulatory Visit: Payer: Self-pay

## 2022-10-15 ENCOUNTER — Encounter: Payer: Self-pay | Admitting: Family Medicine

## 2022-10-15 NOTE — Telephone Encounter (Signed)
Patient needs Rybelsus ordered.

## 2022-10-17 NOTE — Telephone Encounter (Signed)
It appears the Rybelsus is through patient assistance.  Please try to find out with the pharmacy team how to get this refilled for him.  Thanks.

## 2022-10-18 NOTE — Telephone Encounter (Signed)
Form completed and in tray for signature.

## 2022-10-24 NOTE — Telephone Encounter (Signed)
Signed. Please fax.

## 2022-10-25 NOTE — Telephone Encounter (Signed)
Faxed on 10/25/22 and received an okay confirmation.

## 2022-10-29 ENCOUNTER — Other Ambulatory Visit: Payer: Self-pay | Admitting: Family Medicine

## 2022-10-29 DIAGNOSIS — E1159 Type 2 diabetes mellitus with other circulatory complications: Secondary | ICD-10-CM

## 2022-11-06 ENCOUNTER — Telehealth: Payer: Self-pay

## 2022-11-06 ENCOUNTER — Encounter: Payer: Self-pay | Admitting: Family Medicine

## 2022-11-06 NOTE — Telephone Encounter (Signed)
Called Patient to let him know we received 3 boxes of Rybelsus 7 mg Patient Assistant medication and it is ready for pick up.

## 2022-11-06 NOTE — Telephone Encounter (Signed)
Please place in my box when we receive this from him.

## 2022-11-07 ENCOUNTER — Telehealth: Payer: Self-pay | Admitting: Family Medicine

## 2022-11-07 NOTE — Telephone Encounter (Signed)
Have we received this from him yet?

## 2022-11-07 NOTE — Telephone Encounter (Signed)
Received and placed in your needs to be signed basket

## 2022-11-07 NOTE — Telephone Encounter (Signed)
Patient dropped off document  PAP form , to be filled out by provider. Patient requested to send it via Fax within 5-days. Document is located in providers folder at front office.Please advise at  Fax number is [phone]

## 2022-11-08 NOTE — Telephone Encounter (Signed)
Placed in Dr. Purvis Sheffield needs to be signed basket.

## 2022-11-12 NOTE — Telephone Encounter (Signed)
Noted. I have reviewed the form. Please fill in all of our clinic information and the jardiance prescription information. Then I will be able to sign this for him.

## 2022-11-15 NOTE — Telephone Encounter (Signed)
Form completed and ready for signature.  In Dr. Purvis Sheffield box.

## 2022-11-16 NOTE — Telephone Encounter (Signed)
I have signed off on his form. Please fax.

## 2022-11-19 NOTE — Telephone Encounter (Signed)
Paperwork was faxed on Friday and I received an okay confirmation.

## 2022-11-22 DIAGNOSIS — E119 Type 2 diabetes mellitus without complications: Secondary | ICD-10-CM | POA: Diagnosis not present

## 2022-11-22 DIAGNOSIS — H2513 Age-related nuclear cataract, bilateral: Secondary | ICD-10-CM | POA: Diagnosis not present

## 2022-11-22 LAB — HM DIABETES EYE EXAM

## 2022-11-23 ENCOUNTER — Encounter: Payer: Self-pay | Admitting: Family Medicine

## 2022-11-23 NOTE — Telephone Encounter (Signed)
Jardiance paperwork is in Media, but I don't see anything for Ryblesus. Please advise

## 2022-11-26 ENCOUNTER — Encounter: Payer: Self-pay | Admitting: Family Medicine

## 2022-11-26 ENCOUNTER — Ambulatory Visit (INDEPENDENT_AMBULATORY_CARE_PROVIDER_SITE_OTHER): Payer: Medicare Other | Admitting: Family Medicine

## 2022-11-26 VITALS — BP 116/68 | HR 77 | Temp 98.3°F | Ht 68.0 in | Wt 185.0 lb

## 2022-11-26 DIAGNOSIS — E1159 Type 2 diabetes mellitus with other circulatory complications: Secondary | ICD-10-CM | POA: Diagnosis not present

## 2022-11-26 DIAGNOSIS — Z0000 Encounter for general adult medical examination without abnormal findings: Secondary | ICD-10-CM | POA: Diagnosis not present

## 2022-11-26 LAB — BASIC METABOLIC PANEL
BUN: 14 mg/dL (ref 6–23)
CO2: 29 meq/L (ref 19–32)
Calcium: 10.5 mg/dL (ref 8.4–10.5)
Chloride: 101 meq/L (ref 96–112)
Creatinine, Ser: 0.89 mg/dL (ref 0.40–1.50)
GFR: 83.3 mL/min (ref >60.00)
Glucose, Bld: 118 mg/dL — ABNORMAL HIGH (ref 70–99)
Potassium: 4.1 meq/L (ref 3.5–5.1)
Sodium: 139 meq/L (ref 135–145)

## 2022-11-26 LAB — HEMOGLOBIN A1C: Hgb A1c MFr Bld: 6.6 % — ABNORMAL HIGH (ref 4.6–6.5)

## 2022-11-26 NOTE — Telephone Encounter (Signed)
Did we get paperwork to sign on the rybelsus?

## 2022-11-26 NOTE — Progress Notes (Signed)
Marikay Alar, MD Phone: [phone]  Dakota Gonzalez is a 77 y.o. male who presents today for CPE.  Diet: Healthy, gets plenty of fruits and vegetables, no soda or sweet tea, no sweets, no fried foods Exercise: Cardio and weights several most days Colonoscopy: Aged out, negative Cologuard on last screening Prostate cancer screening: Aged out Family history-  Prostate cancer: no  Colon cancer: no Vaccines-   Flu: UTD  Tetanus: UTD  Shingles: UTD  COVID19: UTD  Pneumonia: UTD  RSv: due Hep C Screening: UTD Tobacco use: no Alcohol use: no Illicit Drug use: no Dentist: no Ophthalmology: yes   Active Ambulatory Problems    Diagnosis Date Noted   Diabetes mellitus type 2, controlled (HCC) 07/09/2011   Hyperlipidemia 07/09/2011   Chronic back pain 07/09/2011   Restless legs 01/21/2012   HTN (hypertension) 02/26/2012   Overweight 03/02/2013   Coronary atherosclerosis of native coronary artery 11/09/2014   Right knee pain 11/18/2015   Onychomycosis 03/18/2017   BPH (benign prostatic hyperplasia) 06/20/2017   Adrenal adenoma, left 10/09/2017   Incomplete emptying of bladder 02/11/2012   Nodular prostate with urinary obstruction 02/11/2012   Urine frequency 04/26/2020   Left shoulder pain 11/17/2021   Routine general medical examination at a health care facility 11/26/2022   Resolved Ambulatory Problems    Diagnosis Date Noted   Elevated PSA 07/09/2011   Medicare annual wellness visit, subsequent 10/17/2011   Hemangioma 11/14/2012   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   Bereavement 06/15/2014   Ileus (HCC) 11/09/2014   Constipation 12/01/2014   Tachycardia 06/24/2015   Cold hands and feet 05/04/2016   Bronchitis 10/18/2016   Sinusitis 11/26/2018   Chronic prostatitis 02/11/2012   Past Medical History:  Diagnosis Date   Allergy    Arthritis    Asthma    Depression    Diabetes mellitus 2007   Enlarged prostate    GERD (gastroesophageal reflux  disease)    Headache(784.0)    Hypertension    Migraine     Family History  Problem Relation Age of Onset   Stroke Mother    Kidney disease Mother    Diabetes Mother    Cancer Father    Stroke Father    Kidney disease Father    Diabetes Sister    Cancer Other        breast    Social History   Socioeconomic History   Marital status: Widowed    Spouse name: Not on file   Number of children: Not on file   Years of education: Not on file   Highest education level: Some college, no degree  Occupational History   Not on file  Tobacco Use   Smoking status: Former   Smokeless tobacco: Never  Vaping Use   Vaping status: Never Used  Substance and Sexual Activity   Alcohol use: No   Drug use: No   Sexual activity: Never  Other Topics Concern   Not on file  Social History Narrative   Lives in [place]. 1 son lives in [place].      Work - Disabled, previously Microbiologist and railroad.      Diet - regular   Exercise - ellipitical machine at home   Social Determinants of Health   Financial Resource Strain: Low Risk  (11/25/2022)   Overall Financial Resource Strain (CARDIA)    Difficulty of Paying Living Expenses: Not very hard  Food Insecurity: No Food Insecurity (11/25/2022)   Hunger  Vital Sign    Worried About Programme researcher, broadcasting/film/video in the Last Year: Never true    Ran Out of Food in the Last Year: Never true  Transportation Needs: No Transportation Needs (11/25/2022)   PRAPARE - Administrator, Civil Service (Medical): No    Lack of Transportation (Non-Medical): No  Physical Activity: Insufficiently Active (11/25/2022)   Exercise Vital Sign    Days of Exercise per Week: 4 days    Minutes of Exercise per Session: 20 min  Stress: No Stress Concern Present (11/25/2022)   [employer] of Occupational Health - Occupational Stress Questionnaire    Feeling of Stress : Only a little  Social Connections: Moderately Integrated (11/25/2022)    Social Connection and Isolation Panel [NHANES]    Frequency of Communication with Friends and Family: Twice a week    Frequency of Social Gatherings with Friends and Family: Twice a week    Attends Religious Services: More than 4 times per year    Active Member of [employer] or Organizations: Yes    Attends Banker Meetings: More than 4 times per year    Marital Status: Widowed  Intimate Partner Violence: Not At Risk (07/10/2022)   Humiliation, Afraid, Rape, and Kick questionnaire    Fear of Current or Ex-Partner: No    Emotionally Abused: No    Physically Abused: No    Sexually Abused: No    ROS  General:  Negative for nexplained weight loss, fever Skin: Negative for new or changing mole, sore that won't heal HEENT: Negative for trouble hearing, trouble seeing, ringing in ears, mouth sores, hoarseness, change in voice, dysphagia. CV:  Negative for chest pain, dyspnea, edema, palpitations Resp: Negative for cough, dyspnea, hemoptysis GI: Negative for nausea, vomiting, diarrhea, constipation, abdominal pain, melena, hematochezia. GU: Negative for dysuria, incontinence, urinary hesitance, hematuria, vaginal or penile discharge, polyuria, sexual difficulty, lumps in testicle or breasts MSK: Negative for muscle cramps or aches, joint pain or swelling Neuro: Negative for headaches, weakness, numbness, dizziness, passing out/fainting Psych: Negative for depression, anxiety, memory problems  Objective  Physical Exam Vitals:   11/26/22 0832  BP: 116/68  Pulse: 77  Temp: 98.3 F (36.8 C)  SpO2: 97%    BP Readings from Last 3 Encounters:  11/26/22 116/68  08/28/22 126/80  05/18/22 128/78   Wt Readings from Last 3 Encounters:  11/26/22 185 lb (83.9 kg)  08/28/22 182 lb 9.6 oz (82.8 kg)  07/10/22 188 lb (85.3 kg)    Physical Exam Constitutional:      General: He is not in acute distress.    Appearance: He is not diaphoretic.  HENT:     Head: Normocephalic and  atraumatic.  Cardiovascular:     Rate and Rhythm: Normal rate and regular rhythm.     Heart sounds: Normal heart sounds.  Pulmonary:     Effort: Pulmonary effort is normal.     Breath sounds: Normal breath sounds.  Abdominal:     General: Bowel sounds are normal. There is no distension.     Palpations: Abdomen is soft.     Tenderness: There is no abdominal tenderness.  Musculoskeletal:     Right lower leg: No edema.     Left lower leg: No edema.  Lymphadenopathy:     Cervical: No cervical adenopathy.  Skin:    General: Skin is warm and dry.  Neurological:     Mental Status: He is alert.  Psychiatric:  Mood and Affect: Mood normal.      Assessment/Plan:   Routine general medical examination at a health care facility Assessment & Plan: Physical exam completed.  Encouraged healthy diet and exercise.  Encourage patient get the RSV vaccine given his medical history and his age.  Lab work as outlined.  Encourage patient to see the dentist.   Controlled type 2 diabetes mellitus with other circulatory complication, without long-term current use of insulin (HCC) -     Hemoglobin A1c -     Basic metabolic panel    Return in about 6 months (around 05/26/2023), or transfer of care to [facility].   Marikay Alar, MD [facility]

## 2022-11-26 NOTE — Telephone Encounter (Signed)
I have not seen paperwork for the Rybelsus.

## 2022-11-26 NOTE — Assessment & Plan Note (Addendum)
Physical exam completed.  Encouraged healthy diet and exercise.  Encourage patient get the RSV vaccine given his medical history and his age.  Lab work as outlined.  Encourage patient to see the dentist.

## 2022-11-27 ENCOUNTER — Encounter: Payer: Self-pay | Admitting: Pharmacist

## 2022-11-27 NOTE — Progress Notes (Signed)
Patient messaged clinic regarding request to discuss MAP re-enrollment for 2025.  Currently enrolled through 01/15/23 in [employer] for Rybelsus.   Previously was assisted with enrollment by [facility] Medication Access Team. Patient remains on [facility] list.   [employer] application completed via online re-enrollment portal. PCP notified of electronic signature request arriving via [employer]. Routed to [facility] team.  Patient paged uploaded to Media Tab.    Loree Fee, PharmD Clinical Pharmacist [facility] [phone]

## 2022-11-28 NOTE — Progress Notes (Signed)
Completed.

## 2022-12-04 ENCOUNTER — Encounter: Payer: Self-pay | Admitting: Family Medicine

## 2022-12-05 ENCOUNTER — Telehealth: Payer: Self-pay | Admitting: Family Medicine

## 2022-12-05 NOTE — Telephone Encounter (Signed)
Paperwork is in my needs to be done folder.

## 2022-12-05 NOTE — Telephone Encounter (Signed)
Patient dropped off document Patient Assistance Application, to be filled out by provider. Patient requested to send it via Call Patient to pick up within 5-days. Document is located in providers folder at front office.Please advise at Mobile [phone] (mobile) and [phone].

## 2022-12-20 NOTE — Telephone Encounter (Signed)
[employer] PAP application faxed to [phone]. Received confirmation.  Placed original in red folder to scan to pt's chart.

## 2022-12-20 NOTE — Telephone Encounter (Signed)
PAP faxed to company as requested.

## 2022-12-22 ENCOUNTER — Encounter: Payer: Self-pay | Admitting: Family Medicine

## 2022-12-23 ENCOUNTER — Other Ambulatory Visit: Payer: Self-pay | Admitting: Family Medicine

## 2022-12-24 ENCOUNTER — Telehealth: Payer: Self-pay | Admitting: Family Medicine

## 2022-12-24 NOTE — Telephone Encounter (Signed)
See mychart msg

## 2022-12-24 NOTE — Telephone Encounter (Signed)
Fyi...  Pt dropped off paperwork at office. Providers portion was filled and signed. Paperwork faxed over to [facility].

## 2022-12-24 NOTE — Telephone Encounter (Signed)
I do not see this in my sign basket. Can you place this there? Thanks.

## 2022-12-24 NOTE — Telephone Encounter (Signed)
Received paperwork for BI cares Jardiance. Placed in to be signed folder

## 2022-12-24 NOTE — Telephone Encounter (Signed)
Type of forms received: patience assistance ppw  Routed to: [facility]  Paperwork received by : Audree Camel   Individual made aware of 3-5 business day turn around (Y/N): Y   Form completed and patient made aware of charges(Y/N): Y    Faxed to : ppw be faxed to # on ppw.   Form location:  [facility] folder

## 2022-12-28 ENCOUNTER — Encounter: Payer: Self-pay | Admitting: Family Medicine

## 2023-01-20 ENCOUNTER — Encounter: Payer: Self-pay | Admitting: Family Medicine

## 2023-01-21 NOTE — Telephone Encounter (Signed)
 I do not see anything regarding his rybelsus. Can you follow-up on this? Thanks.

## 2023-01-22 NOTE — Telephone Encounter (Signed)
 FYI, pt received notification. Still haven't seen anything on our end

## 2023-01-28 ENCOUNTER — Encounter: Payer: Self-pay | Admitting: Family Medicine

## 2023-01-28 ENCOUNTER — Other Ambulatory Visit: Payer: Self-pay | Admitting: Family Medicine

## 2023-01-28 DIAGNOSIS — E785 Hyperlipidemia, unspecified: Secondary | ICD-10-CM

## 2023-02-11 ENCOUNTER — Encounter: Payer: Self-pay | Admitting: Family Medicine

## 2023-03-12 ENCOUNTER — Encounter: Payer: Self-pay | Admitting: Nurse Practitioner

## 2023-03-12 ENCOUNTER — Encounter: Payer: Self-pay | Admitting: Pharmacist

## 2023-03-12 NOTE — Progress Notes (Unsigned)
 Patient Assistance Program (PAP) Application   Manufacturer: [employer]  Prescriber Change Form Medication(s): Rybelsus 7 mg tablets  Form placed in PCP (Bethanie Dicker) inbox for signature then fax to [employer].  Clinical; Pool cc'd

## 2023-03-12 NOTE — Telephone Encounter (Signed)
 Forms faxed to [employer] @ [phone] with a completed transmission log. Forms placed in scan folder to be scanned in chart.

## 2023-03-14 ENCOUNTER — Encounter: Payer: Self-pay | Admitting: Nurse Practitioner

## 2023-03-14 ENCOUNTER — Other Ambulatory Visit: Payer: Self-pay | Admitting: Nurse Practitioner

## 2023-03-14 DIAGNOSIS — E1159 Type 2 diabetes mellitus with other circulatory complications: Secondary | ICD-10-CM

## 2023-03-14 MED ORDER — RYBELSUS 7 MG PO TABS: 7.0000 mg | ORAL_TABLET | Freq: Every day | ORAL | 0 refills | Status: AC

## 2023-03-14 NOTE — Telephone Encounter (Signed)
 Rx has been sent

## 2023-03-31 ENCOUNTER — Encounter: Payer: Self-pay | Admitting: Nurse Practitioner

## 2023-04-23 ENCOUNTER — Telehealth: Payer: Self-pay

## 2023-04-23 NOTE — Telephone Encounter (Signed)
 Called and informed pt of pt assistance meds ready for pick up. Meds have been placed in designated pick up area    Med: Rybelsus 7 mg QTY: 4 boxes LOT: [id number] EXP: 04-14-24

## 2023-05-28 ENCOUNTER — Ambulatory Visit: Payer: Self-pay | Admitting: Nurse Practitioner

## 2023-05-28 ENCOUNTER — Ambulatory Visit (INDEPENDENT_AMBULATORY_CARE_PROVIDER_SITE_OTHER): Payer: Medicare Other | Admitting: Nurse Practitioner

## 2023-05-28 ENCOUNTER — Encounter: Payer: Self-pay | Admitting: Nurse Practitioner

## 2023-05-28 VITALS — BP 110/78 | HR 75 | Temp 98.0°F | Ht 68.0 in | Wt 186.0 lb

## 2023-05-28 DIAGNOSIS — E1159 Type 2 diabetes mellitus with other circulatory complications: Secondary | ICD-10-CM | POA: Diagnosis not present

## 2023-05-28 DIAGNOSIS — Z1329 Encounter for screening for other suspected endocrine disorder: Secondary | ICD-10-CM

## 2023-05-28 DIAGNOSIS — Z7984 Long term (current) use of oral hypoglycemic drugs: Secondary | ICD-10-CM | POA: Diagnosis not present

## 2023-05-28 DIAGNOSIS — N400 Enlarged prostate without lower urinary tract symptoms: Secondary | ICD-10-CM | POA: Diagnosis not present

## 2023-05-28 DIAGNOSIS — I10 Essential (primary) hypertension: Secondary | ICD-10-CM | POA: Diagnosis not present

## 2023-05-28 DIAGNOSIS — E785 Hyperlipidemia, unspecified: Secondary | ICD-10-CM | POA: Diagnosis not present

## 2023-05-28 LAB — MICROALBUMIN / CREATININE URINE RATIO
Creatinine,U: 42.1 mg/dL
Microalb Creat Ratio: 20.1 mg/g (ref 0.0–30.0)
Microalb, Ur: 0.8 mg/dL (ref 0.0–1.9)

## 2023-05-28 LAB — LIPID PANEL
Cholesterol: 118 mg/dL (ref 0–200)
HDL: 47.8 mg/dL (ref >39.00)
LDL Cholesterol: 50 mg/dL (ref 0–99)
NonHDL: 69.88
Total CHOL/HDL Ratio: 2
Triglycerides: 98 mg/dL (ref 0.0–149.0)
VLDL: 19.6 mg/dL (ref 0.0–40.0)

## 2023-05-28 LAB — COMPREHENSIVE METABOLIC PANEL WITH GFR
ALT: 24 U/L (ref 0–53)
AST: 20 U/L (ref 0–37)
Albumin: 4.7 g/dL (ref 3.5–5.2)
Alkaline Phosphatase: 58 U/L (ref 39–117)
BUN: 14 mg/dL (ref 6–23)
CO2: 30 meq/L (ref 19–32)
Calcium: 10.3 mg/dL (ref 8.4–10.5)
Chloride: 101 meq/L (ref 96–112)
Creatinine, Ser: 0.88 mg/dL (ref 0.40–1.50)
GFR: 83.29 mL/min (ref >60.00)
Glucose, Bld: 113 mg/dL — ABNORMAL HIGH (ref 70–99)
Potassium: 4.1 meq/L (ref 3.5–5.1)
Sodium: 140 meq/L (ref 135–145)
Total Bilirubin: 1.4 mg/dL — ABNORMAL HIGH (ref 0.2–1.2)
Total Protein: 7.3 g/dL (ref 6.0–8.3)

## 2023-05-28 LAB — CBC WITH DIFFERENTIAL/PLATELET
Basophils Absolute: 0 10*3/uL (ref 0.0–0.1)
Basophils Relative: 0.2 % (ref 0.0–3.0)
Eosinophils Absolute: 0.1 10*3/uL (ref 0.0–0.7)
Eosinophils Relative: 1.1 % (ref 0.0–5.0)
HCT: 49.5 % (ref 39.0–52.0)
Hemoglobin: 16.4 g/dL (ref 13.0–17.0)
Lymphocytes Relative: 28.2 % (ref 12.0–46.0)
Lymphs Abs: 1.8 10*3/uL (ref 0.7–4.0)
MCHC: 33.1 g/dL (ref 30.0–36.0)
MCV: 90.4 fl (ref 78.0–100.0)
Monocytes Absolute: 0.5 10*3/uL (ref 0.1–1.0)
Monocytes Relative: 8 % (ref 3.0–12.0)
Neutro Abs: 4 10*3/uL (ref 1.4–7.7)
Neutrophils Relative %: 62.5 % (ref 43.0–77.0)
Platelets: 140 10*3/uL — ABNORMAL LOW (ref 150.0–400.0)
RBC: 5.47 Mil/uL (ref 4.22–5.81)
RDW: 14.1 % (ref 11.5–15.5)
WBC: 6.4 10*3/uL (ref 4.0–10.5)

## 2023-05-28 LAB — TSH: TSH: 2.99 u[IU]/mL (ref 0.35–5.50)

## 2023-05-28 LAB — HEMOGLOBIN A1C: Hgb A1c MFr Bld: 6.7 % — ABNORMAL HIGH (ref 4.6–6.5)

## 2023-05-28 MED ORDER — FINASTERIDE 5 MG PO TABS: ORAL_TABLET | ORAL | 3 refills | Status: DC

## 2023-05-28 MED ORDER — METFORMIN HCL 500 MG PO TABS: ORAL_TABLET | ORAL | 3 refills | Status: AC

## 2023-05-28 MED ORDER — ATORVASTATIN CALCIUM 40 MG PO TABS: ORAL_TABLET | ORAL | 3 refills | Status: AC

## 2023-05-28 MED ORDER — TAMSULOSIN HCL 0.4 MG PO CAPS: 0.4000 mg | ORAL_CAPSULE | Freq: Every day | ORAL | 3 refills | Status: AC

## 2023-05-28 NOTE — Assessment & Plan Note (Signed)
 His benign prostatic hyperplasia is managed with Flomax  and Proscar , with no new urinary symptoms. Continue Flomax  0.4 mg daily and Proscar  5 mg daily.

## 2023-05-28 NOTE — Assessment & Plan Note (Signed)
 His diabetes is well-controlled with the current medication regimen. Last A1c- 6.6. Blood glucose levels are stable, with occasional hypoglycemia and no complications. Continue metformin  1000 mg twice daily, Rybelsus  7 mg daily, and Jardiance  25 mg daily. Check A1c today. Encourage healthy diet and regular exercise.

## 2023-05-28 NOTE — Assessment & Plan Note (Signed)
 His hyperlipidemia is managed with atorvastatin , with no adverse symptoms. Continue atorvastatin  40 mg daily. Check lipid panel today. Encourage healthy diet and regular exercise.

## 2023-05-28 NOTE — Progress Notes (Signed)
 Bluford Burkitt, NP-C Phone: [phone]  Dakota Gonzalez is a 78 y.o. male who presents today for transfer of care.   Discussed the use of AI scribe software for clinical note transcription with the patient, who gave verbal consent to proceed.  History of Present Illness   Dakota Gonzalez is a 78 year old male who presents for transfer of care.  Approximately one month ago, he experienced a low back strain after lifting mulch, which resulted in significant discomfort and required bed rest for two days. His symptoms progressively improved without the need for significant pain medication, and he used Tylenol  sparingly during this period. He maintains a daily exercise routine at home, which he believes contributes to his recovery.  He monitors his blood pressure at home, consistently recording values below 120/70 mmHg. He is not currently on any antihypertensive medications, although he was previously. No chest pain, shortness of breath, dizziness, or swelling.  His diabetes management includes metformin  500 mg, two tablets twice daily, Rybelsus  7 mg daily, and Jardiance  25 mg daily. He checks his blood sugar twice daily, typically around 10 AM and 3:30-4 PM, with readings averaging around 120 mg/dL. He reports occasional low blood sugar episodes, with two or three readings below 80 mg/dL, but generally maintains levels in the 90s. No excessive thirst or significant hypoglycemic symptoms.  He is on Lipitor for cholesterol management and takes Flomax  and Proscar  for prostate issues. No abdominal pain or muscle aches.      Social History   Tobacco Use  Smoking Status Former  Smokeless Tobacco Never    Current Outpatient Medications on File Prior to Visit  Medication Sig Dispense Refill   aspirin 81 MG tablet Take 81 mg by mouth daily.     Blood Glucose Monitoring Suppl (ONE TOUCH ULTRA 2) w/Device KIT 1 Device by Does not apply route daily as needed. 1 kit 0   Coenzyme Q10 (COQ-10) 100 MG  CAPS Take 1 tablet by mouth daily.     cyclobenzaprine  (FLEXERIL ) 10 MG tablet Take 1 tablet (10 mg total) by mouth every 8 (eight) hours as needed for muscle spasms (do not drive while using, may make you drowsy). 20 tablet 0   docusate sodium (COLACE) 100 MG capsule Take 100 mg by mouth daily as needed for mild constipation.     empagliflozin  (JARDIANCE ) 25 MG TABS tablet Take 1 tablet (25 mg total) by mouth daily. 90 tablet 3   glucose blood (ONETOUCH ULTRA) test strip USE FOR TESTING TWICE DAILY AS DIRECTED 200 strip 5   Lancets (ONETOUCH DELICA PLUS LANCET33G) MISC USE TO CHECK BLOOD SUGAR TWICE DAILY AS DIRECTED 200 each 5   Multiple Vitamins-Minerals (CENTRUM SILVER PO) Take 1 tablet by mouth daily.     Semaglutide  (RYBELSUS ) 7 MG TABS Take 1 tablet (7 mg total) by mouth daily. 30 tablet 0   No current facility-administered medications on file prior to visit.     ROS see history of present illness  Objective  Physical Exam Vitals:   05/28/23 0905  BP: 110/78  Pulse: 75  Temp: 98 F (36.7 C)  SpO2: 98%    BP Readings from Last 3 Encounters:  05/28/23 110/78  11/26/22 116/68  08/28/22 126/80   Wt Readings from Last 3 Encounters:  05/28/23 186 lb (84.4 kg)  11/26/22 185 lb (83.9 kg)  08/28/22 182 lb 9.6 oz (82.8 kg)    Physical Exam Constitutional:      General: He is not in  acute distress.    Appearance: Normal appearance.  HENT:     Head: Normocephalic.  Cardiovascular:     Rate and Rhythm: Normal rate and regular rhythm.     Heart sounds: Normal heart sounds.  Pulmonary:     Effort: Pulmonary effort is normal.     Breath sounds: Normal breath sounds.  Skin:    General: Skin is warm and dry.  Neurological:     General: No focal deficit present.     Mental Status: He is alert.  Psychiatric:        Mood and Affect: Mood normal.        Behavior: Behavior normal.      Assessment/Plan: Please see individual problem list.  Controlled type 2 diabetes  mellitus with other circulatory complication, without long-term current use of insulin (HCC) Assessment & Plan: His diabetes is well-controlled with the current medication regimen. Last A1c- 6.6. Blood glucose levels are stable, with occasional hypoglycemia and no complications. Continue metformin  1000 mg twice daily, Rybelsus  7 mg daily, and Jardiance  25 mg daily. Check A1c today. Encourage healthy diet and regular exercise.   Orders: -     Microalbumin / creatinine urine ratio -     Hemoglobin A1c -     metFORMIN  HCl; Take 2 tablets daily by mouth twice daily with a meal  Dispense: 360 tablet; Refill: 3  Primary hypertension Assessment & Plan: Blood pressure is well controlled without medication. BP at goal today in office. Advised to continue home monitoring and contact if consistently elevated.   Orders: -     CBC with Differential/Platelet  Benign prostatic hyperplasia without lower urinary tract symptoms Assessment & Plan: His benign prostatic hyperplasia is managed with Flomax  and Proscar , with no new urinary symptoms. Continue Flomax  0.4 mg daily and Proscar  5 mg daily.   Orders: -     Finasteride ; TAKE 1 TABLET(5 MG) BY MOUTH DAILY  Dispense: 90 tablet; Refill: 3 -     Tamsulosin  HCl; Take 1 capsule (0.4 mg total) by mouth daily.  Dispense: 90 capsule; Refill: 3  Hyperlipidemia, unspecified hyperlipidemia type Assessment & Plan: His hyperlipidemia is managed with atorvastatin , with no adverse symptoms. Continue atorvastatin  40 mg daily. Check lipid panel today. Encourage healthy diet and regular exercise.   Orders: -     Comprehensive metabolic panel with GFR -     Lipid panel -     Atorvastatin  Calcium ; TAKE 1 TABLET(40 MG) BY MOUTH DAILY  Dispense: 90 tablet; Refill: 3  Thyroid  disorder screen -     TSH    Return in about 6 months (around 11/28/2023) for Annual Exam.   Bluford Burkitt, NP-C [facility]

## 2023-05-28 NOTE — Assessment & Plan Note (Signed)
 Blood pressure is well controlled without medication. BP at goal today in office. Advised to continue home monitoring and contact if consistently elevated.

## 2023-06-17 ENCOUNTER — Encounter: Payer: Self-pay | Admitting: Nurse Practitioner

## 2023-06-18 ENCOUNTER — Telehealth: Payer: Self-pay

## 2023-06-18 NOTE — Telephone Encounter (Signed)
 Patient assistance forms received for pts Rybelsus  7 mg. Form has been placed in provider to be signed folder.

## 2023-06-18 NOTE — Telephone Encounter (Signed)
 Form faxed to [phone] with a completed transmission log form copied and placed in scan folder.

## 2023-06-24 ENCOUNTER — Encounter: Payer: Self-pay | Admitting: Nurse Practitioner

## 2023-06-24 DIAGNOSIS — N400 Enlarged prostate without lower urinary tract symptoms: Secondary | ICD-10-CM

## 2023-06-25 MED ORDER — FINASTERIDE 5 MG PO TABS: ORAL_TABLET | ORAL | 3 refills | Status: AC

## 2023-07-15 ENCOUNTER — Ambulatory Visit (INDEPENDENT_AMBULATORY_CARE_PROVIDER_SITE_OTHER): Payer: Medicare Other | Admitting: *Deleted

## 2023-07-15 VITALS — Ht 68.0 in | Wt 191.0 lb

## 2023-07-15 DIAGNOSIS — Z0000 Encounter for general adult medical examination without abnormal findings: Secondary | ICD-10-CM | POA: Diagnosis not present

## 2023-07-15 NOTE — Patient Instructions (Signed)
 Mr. Dakota Gonzalez , Thank you for taking time out of your busy schedule to complete your Annual Wellness Visit with me. I enjoyed our conversation and look forward to speaking with you again next year. I, as well as your care team,  appreciate your ongoing commitment to your health goals. Please review the following plan we discussed and let me know if I can assist you in the future. Your Game plan/ To Do List    Referrals: If you haven't heard from the office you've been referred to, please reach out to them at the phone provided.  Remember to get your annual flu and covid vaccines. Follow up Visits: Next Medicare AWV with our clinical staff: 07/21/23 @ 8:10   Have you seen your provider in the last 6 months (3 months if uncontrolled diabetes)? Yes Next Office Visit with your provider: 11/27/23  Clinician Recommendations:  Aim for 30 minutes of exercise or brisk walking, 6-8 glasses of water, and 5 servings of fruits and vegetables each day.       This is a list of the screening recommended for you and due dates:  Health Maintenance  Topic Date Due   COVID-19 Vaccine (7 - 2024-25 season) 04/11/2023   Complete foot exam   05/18/2023   Flu Shot  08/16/2023   Eye exam for diabetics  11/22/2023   Hemoglobin A1C  11/28/2023   Yearly kidney function blood test for diabetes  05/27/2024   Yearly kidney health urinalysis for diabetes  05/27/2024   Medicare Annual Wellness Visit  07/14/2024   DTaP/Tdap/Td vaccine (2 - Td or Tdap) 03/21/2027   Pneumococcal Vaccine for age over 30  Completed   Hepatitis C Screening  Completed   Zoster (Shingles) Vaccine  Completed   Hepatitis B Vaccine  Aged Out   HPV Vaccine  Aged Out   Meningitis B Vaccine  Aged Out   Cologuard (Stool DNA test)  Discontinued    Advanced directives: (Copy Requested) Please bring a copy of your health care power of attorney and living will to the office to be added to your chart at your convenience. You can mail to [facility] [address] [place], [place] [address] or email to [email or web address] Advance Care Planning is important because it:  [x]  Makes sure you receive the medical care that is consistent with your values, goals, and preferences  [x]  It provides guidance to your family and loved ones and reduces their decisional burden about whether or not they are making the right decisions based on your wishes.

## 2023-07-15 NOTE — Progress Notes (Signed)
 Subjective:   Dakota Gonzalez is a 78 y.o. who presents for a Medicare Wellness preventive visit.  As a reminder, Annual Wellness Visits don't include a physical exam, and some assessments may be limited, especially if this visit is performed virtually. We may recommend an in-person follow-up visit with your provider if needed.  Visit Complete: Virtual I connected with  Dakota Gonzalez on 07/15/23 by a audio enabled telemedicine application and verified that I am speaking with the correct person using two identifiers.  Patient Location: Home  Provider Location: Home Office  I discussed the limitations of evaluation and management by telemedicine. The patient expressed understanding and agreed to proceed.  Vital Signs: Because this visit was a virtual/telehealth visit, some criteria may be missing or patient reported. Any vitals not documented were not able to be obtained and vitals that have been documented are patient reported.  VideoDeclined- This patient declined Librarian, academic. Therefore the visit was completed with audio only.  Persons Participating in Visit: Patient.  AWV Questionnaire: Yes: Patient Medicare AWV questionnaire was completed by the patient on 07/11/23; I have confirmed that all information answered by patient is correct and no changes since this date.  Cardiac Risk Factors include: advanced age (>62men, >56 women);diabetes mellitus;dyslipidemia;hypertension;male gender     Objective:    Today's Vitals   07/15/23 0812  Weight: 191 lb (86.6 kg)  Height: 5' 8 (1.727 m)   Body mass index is 29.04 kg/m.     07/15/2023    8:25 AM 07/10/2022    9:32 AM 05/03/2020    9:16 AM 04/30/2019    9:18 AM 04/29/2018   11:50 AM 04/26/2017    3:25 PM 04/25/2016    3:15 PM  Advanced Directives  Does Patient Have a Medical Advance Directive? Yes Yes Yes Yes Yes Yes  No   Type of Estate agent of [place];Living will  Healthcare Power of [place];Living will Healthcare Power of [employer] of [place];Living will Living will Healthcare Power of [place];Living will   Does patient want to make changes to medical advance directive?   No - Patient declined No - Patient declined No - Patient declined  No - Patient declined    Copy of Healthcare Power of Attorney in Chart? No - copy requested No - copy requested No - copy requested No - copy requested  No - copy requested    Would patient like information on creating a medical advance directive?       No - Patient declined      Data saved with a previous flowsheet row definition    Current Medications (verified) Outpatient Encounter Medications as of 07/15/2023  Medication Sig   aspirin 81 MG tablet Take 81 mg by mouth daily.   atorvastatin  (LIPITOR) 40 MG tablet TAKE 1 TABLET(40 MG) BY MOUTH DAILY   Blood Glucose Monitoring Suppl (ONE TOUCH ULTRA 2) w/Device KIT 1 Device by Does not apply route daily as needed.   Coenzyme Q10 (COQ-10) 100 MG CAPS Take 1 tablet by mouth daily.   cyclobenzaprine  (FLEXERIL ) 10 MG tablet Take 1 tablet (10 mg total) by mouth every 8 (eight) hours as needed for muscle spasms (do not drive while using, may make you drowsy).   docusate sodium (COLACE) 100 MG capsule Take 100 mg by mouth daily as needed for mild constipation.   empagliflozin  (JARDIANCE ) 25 MG TABS tablet Take 1 tablet (25 mg total) by mouth daily.   finasteride  (  PROSCAR ) 5 MG tablet TAKE 1 TABLET(5 MG) BY MOUTH DAILY   glucose blood (ONETOUCH ULTRA) test strip USE FOR TESTING TWICE DAILY AS DIRECTED   Lancets (ONETOUCH DELICA PLUS LANCET33G) MISC USE TO CHECK BLOOD SUGAR TWICE DAILY AS DIRECTED   metFORMIN  (GLUCOPHAGE ) 500 MG tablet Take 2 tablets daily by mouth twice daily with a meal   Misc Natural Products (PROSTATE PO) Take by mouth daily.   Multiple Vitamins-Minerals (CENTRUM SILVER PO) Take 1 tablet by mouth daily.   Semaglutide  (RYBELSUS ) 7 MG TABS  Take 1 tablet (7 mg total) by mouth daily.   tamsulosin  (FLOMAX ) 0.4 MG CAPS capsule Take 1 capsule (0.4 mg total) by mouth daily.   No facility-administered encounter medications on file as of 07/15/2023.    Allergies (verified) Aspartame   History: Past Medical History:  Diagnosis Date   Allergy    Arthritis    Asthma    Depression    Diabetes mellitus 2007   Elevated PSA 07/09/2011   Enlarged prostate    GERD (gastroesophageal reflux disease)    Headache(784.0)    Hyperlipidemia    Hypertension    Migraine    Sinusitis    Past Surgical History:  Procedure Laterality Date   BACK SURGERY  10/2003   ruptured disc, L4-5, [place]    HEMANGIOMA EXCISION  2014   right upper arm, Dr. Dreama   PROSTATE BIOPSY  2014   Dr. Ike   TONSILLECTOMY AND ADENOIDECTOMY  1960   Family History  Problem Relation Age of Onset   Stroke Mother    Kidney disease Mother    Diabetes Mother    Cancer Father    Stroke Father    Kidney disease Father    Diabetes Sister    Cancer Other        breast   Social History   Socioeconomic History   Marital status: Widowed    Spouse name: Not on file   Number of children: Not on file   Years of education: Not on file   Highest education level: Some college, no degree  Occupational History   Not on file  Tobacco Use   Smoking status: Former   Smokeless tobacco: Never  Vaping Use   Vaping status: Never Used  Substance and Sexual Activity   Alcohol use: No   Drug use: No   Sexual activity: Never  Other Topics Concern   Not on file  Social History Narrative   Lives in [place]. 1 son lives in [place] .      Work - Disabled, previously Microbiologist and railroad.      Diet - regular   Exercise - ellipitical machine at home   Social Drivers of Health   Financial Resource Strain: Low Risk  (07/11/2023)   Overall Financial Resource Strain (CARDIA)    Difficulty of Paying Living Expenses: Not very hard  Food  Insecurity: No Food Insecurity (07/11/2023)   Hunger Vital Sign    Worried About Running Out of Food in the Last Year: Never true    Ran Out of Food in the Last Year: Never true  Transportation Needs: No Transportation Needs (07/11/2023)   PRAPARE - Administrator, Civil Service (Medical): No    Lack of Transportation (Non-Medical): No  Physical Activity: Insufficiently Active (07/11/2023)   Exercise Vital Sign    Days of Exercise per Week: 4 days    Minutes of Exercise per Session: 20 min  Stress: No  Stress Concern Present (07/11/2023)   [employer] of Occupational Health - Occupational Stress Questionnaire    Feeling of Stress: Only a little  Social Connections: Moderately Integrated (07/11/2023)   Social Connection and Isolation Panel    Frequency of Communication with Friends and Family: Once a week    Frequency of Social Gatherings with Friends and Family: More than three times a week    Attends Religious Services: More than 4 times per year    Active Member of [employer] or Organizations: Yes    Attends Banker Meetings: 1 to 4 times per year    Marital Status: Widowed    Tobacco Counseling Counseling given: Not Answered    Clinical Intake:  Pre-visit preparation completed: Yes  Pain : No/denies pain     BMI - recorded: 29.04 Nutritional Status: BMI 25 -29 Overweight Nutritional Risks: None Diabetes: Yes CBG done?: No Did pt. bring in CBG monitor from home?: No  Lab Results  Component Value Date   HGBA1C 6.7 (H) 05/28/2023   HGBA1C 6.6 (H) 11/26/2022   HGBA1C 6.2 (A) 08/28/2022     How often do you need to have someone help you when you read instructions, pamphlets, or other written materials from your doctor or pharmacy?: 1 - Never  Interpreter Needed?: No  Information entered by :: R. Taijuan Serviss LPN   Activities of Daily Living     07/11/2023    1:55 PM  In your present state of health, do you have any difficulty performing the  following activities:  Hearing? 0  Vision? 0  Difficulty concentrating or making decisions? 0  Walking or climbing stairs? 0  Dressing or bathing? 0  Doing errands, shopping? 0  Preparing Food and eating ? N  Using the Toilet? N  In the past six months, have you accidently leaked urine? N  Do you have problems with loss of bowel control? N  Managing your Medications? N  Managing your Finances? N  Housekeeping or managing your Housekeeping? N    Patient Care Team: Gretel App, NP as PCP - General (Nurse Practitioner) Darron Deatrice LABOR, MD as Consulting Physician (Cardiology)  I have updated your Care Teams any recent Medical Services you may have received from other providers in the past year.     Assessment:   This is a routine wellness examination for Jabe.  Hearing/Vision screen Hearing Screening - Comments:: No issues Vision Screening - Comments:: readers   Goals Addressed             This Visit's Progress    Patient Stated       Wants to continue to exercise and strengthen his back       Depression Screen     07/15/2023    8:21 AM 05/28/2023    9:06 AM 11/26/2022    8:50 AM 07/10/2022    9:24 AM 05/18/2022    8:24 AM 11/17/2021    9:37 AM 05/16/2021    9:10 AM  PHQ 2/9 Scores  PHQ - 2 Score 0 0 0 0 0 0 0  PHQ- 9 Score 0 0 0 0 0      Fall Risk     07/11/2023    1:55 PM 05/28/2023    9:06 AM 11/26/2022    8:50 AM 08/28/2022   10:01 AM 07/10/2022    9:22 AM  Fall Risk   Falls in the past year? 0 0 0 1 0  Number falls in past yr: 0  0 0 1 0  Injury with Fall? 0 0 0 0 0  Risk for fall due to : No Fall Risks No Fall Risks No Fall Risks History of fall(s) No Fall Risks  Follow up Falls evaluation completed;Falls prevention discussed Falls evaluation completed Falls evaluation completed Falls evaluation completed Falls prevention discussed;Falls evaluation completed;Education provided    MEDICARE RISK AT HOME:  Medicare Risk at Home Any stairs in or around  the home?: (Patient-Rptd) Yes If so, are there any without handrails?: (Patient-Rptd) No Home free of loose throw rugs in walkways, pet beds, electrical cords, etc?: (Patient-Rptd) No Adequate lighting in your home to reduce risk of falls?: (Patient-Rptd) Yes Life alert?: (Patient-Rptd) No Use of a cane, walker or w/c?: (Patient-Rptd) No Grab bars in the bathroom?: (Patient-Rptd) Yes Shower chair or bench in shower?: (Patient-Rptd) Yes Elevated toilet seat or a handicapped toilet?: (Patient-Rptd) Yes  TIMED UP AND GO:  Was the test performed?  No  Cognitive Function: 6CIT completed    04/25/2016    3:41 PM  MMSE - Mini Mental State Exam  Orientation to time 5   Orientation to Place 5   Registration 3   Attention/ Calculation 5   Recall 3   Language- name 2 objects 2   Language- repeat 1  Language- follow 3 step command 3   Language- read & follow direction 1   Write a sentence 1   Copy design 1   Total score 30      Data saved with a previous flowsheet row definition        07/15/2023    8:28 AM 07/10/2022    9:32 AM 04/30/2019    9:22 AM 04/29/2018   11:55 AM 04/26/2017    3:27 PM  6CIT Screen  What Year? 0 points 0 points 0 points 0 points 0 points  What month? 0 points 0 points 0 points 0 points 0 points  What time? 0 points 0 points 0 points 0 points 0 points  Count back from 20 0 points 0 points  0 points 0 points  Months in reverse 0 points 0 points  0 points 0 points  Repeat phrase 2 points 0 points  0 points 0 points  Total Score 2 points 0 points  0 points 0 points    Immunizations Immunization History  Administered Date(s) Administered   Fluad Quad(high Dose 65+) 09/26/2018, 10/29/2021   Influenza Split 10/17/2011   Influenza, High Dose Seasonal PF 09/19/2016, 10/03/2017   Influenza,inj,Quad PF,6+ Mos 09/22/2012, 10/10/2013, 11/10/2014   Influenza-Unspecified 11/03/2017, 10/20/2019, 09/06/2020, 10/08/2022   Moderna Covid-19 Vaccine Bivalent Booster  58yrs & up 11/08/2020   Moderna SARS-COV2 Booster Vaccination 10/16/2019   Moderna Sars-Covid-2 Vaccination 02/27/2019, 03/27/2019   Pneumococcal Conjugate-13 06/12/2013   Pneumococcal Polysaccharide-23 10/17/2011   Tdap 03/20/2017   Unspecified SARS-COV-2 Vaccination 10/29/2021, 10/12/2022   Zoster Recombinant(Shingrix) 09/06/2020, 11/08/2020    Screening Tests Health Maintenance  Topic Date Due   COVID-19 Vaccine (7 - 2024-25 season) 04/11/2023   FOOT EXAM  05/18/2023   Medicare Annual Wellness (AWV)  07/10/2023   INFLUENZA VACCINE  08/16/2023   OPHTHALMOLOGY EXAM  11/22/2023   HEMOGLOBIN A1C  11/28/2023   Diabetic kidney evaluation - eGFR measurement  05/27/2024   Diabetic kidney evaluation - Urine ACR  05/27/2024   DTaP/Tdap/Td (2 - Td or Tdap) 03/21/2027   Pneumococcal Vaccine: 50+ Years  Completed   Hepatitis C Screening  Completed   Zoster Vaccines- Shingrix  Completed  Hepatitis B Vaccines  Aged Out   HPV VACCINES  Aged Out   Meningococcal B Vaccine  Aged Out   Fecal DNA (Cologuard)  Discontinued    Health Maintenance  Health Maintenance Due  Topic Date Due   COVID-19 Vaccine (7 - 2024-25 season) 04/11/2023   FOOT EXAM  05/18/2023   Medicare Annual Wellness (AWV)  07/10/2023   Health Maintenance Items Addressed: Discussed the need to keep vaccines up to date. Patient needs to have a diabetic foot exam completed and documented at next visit.  Additional Screening:  Vision Screening: Recommended annual ophthalmology exams for early detection of glaucoma and other disorders of the eye.Up to date [facility] Would you like a referral to an eye doctor? No    Dental Screening: Recommended annual dental exams for proper oral hygiene  Community Resource Referral / Chronic Care Management: CRR required this visit?  No   CCM required this visit?  No   Plan:    I have personally reviewed and noted the following in the patient's chart:   Medical and social  history Use of alcohol, tobacco or illicit drugs  Current medications and supplements including opioid prescriptions. Patient is not currently taking opioid prescriptions. Functional ability and status Nutritional status Physical activity Advanced directives List of other physicians Hospitalizations, surgeries, and ER visits in previous 12 months Vitals Screenings to include cognitive, depression, and falls Referrals and appointments  In addition, I have reviewed and discussed with patient certain preventive protocols, quality metrics, and best practice recommendations. A written personalized care plan for preventive services as well as general preventive health recommendations were provided to patient.   Angeline Fredericks, LPN   3/[id number]/7974   After Visit Summary: (MyChart) Due to this being a telephonic visit, the after visit summary with patients personalized plan was offered to patient via MyChart   Notes: Nothing significant to report at this time.

## 2023-09-02 ENCOUNTER — Telehealth: Payer: Self-pay

## 2023-09-02 ENCOUNTER — Encounter: Payer: Self-pay | Admitting: Nurse Practitioner

## 2023-09-02 NOTE — Telephone Encounter (Signed)
 NOVO nordisk form received needing provider signature placed in provider to be signed folder

## 2023-09-03 ENCOUNTER — Other Ambulatory Visit: Payer: Self-pay | Admitting: Nurse Practitioner

## 2023-09-03 DIAGNOSIS — E1159 Type 2 diabetes mellitus with other circulatory complications: Secondary | ICD-10-CM

## 2023-09-03 MED ORDER — LANCET DEVICE MISC
1.0000 | Freq: Every day | 0 refills | Status: AC
Start: 2023-09-03 — End: ?

## 2023-09-03 MED ORDER — LANCETS MISC. MISC: 1.0000 | Freq: Every day | 3 refills | Status: DC

## 2023-09-03 MED ORDER — BLOOD GLUCOSE MONITORING SUPPL DEVI: 1.0000 | Freq: Every day | 0 refills | Status: DC

## 2023-09-03 MED ORDER — BLOOD GLUCOSE TEST VI STRP: 1.0000 | ORAL_STRIP | Freq: Every day | 3 refills | Status: DC

## 2023-09-03 NOTE — Telephone Encounter (Signed)
 Form faxed to [phone] with a completed transmission log

## 2023-09-04 ENCOUNTER — Encounter: Payer: Self-pay | Admitting: Nurse Practitioner

## 2023-09-10 ENCOUNTER — Encounter: Payer: Self-pay | Admitting: Nurse Practitioner

## 2023-09-25 ENCOUNTER — Encounter: Payer: Self-pay | Admitting: Nurse Practitioner

## 2023-09-26 NOTE — Telephone Encounter (Signed)
 Noted will awat pt response

## 2023-09-26 NOTE — Telephone Encounter (Signed)
 Please advise, pt has 3mg  pills available we also have 3mg  samples available in office.

## 2023-09-27 NOTE — Telephone Encounter (Signed)
 Medication Samples have been provided to the patient.  Drug name: Rybelsus        Strength: 3 mg        Qty: 1 box  LOT: [id number]  Exp.Date: 01-15-24  Dosing instructions: take 2 tablets daily for  total of 6 mg (until Novo Nordisk can ship 7 mg)   The patient has been instructed regarding the correct time, dose, and frequency of taking this medication, including desired effects and most common side effects.   Dakota Gonzalez 9:14 AM 09/27/2023

## 2023-10-03 ENCOUNTER — Encounter: Payer: Self-pay | Admitting: Nurse Practitioner

## 2023-10-03 ENCOUNTER — Telehealth: Payer: Self-pay | Admitting: Nurse Practitioner

## 2023-10-03 NOTE — Telephone Encounter (Signed)
 Mychart forwarded to provider

## 2023-10-03 NOTE — Telephone Encounter (Unsigned)
 Copied from CRM [id number]. Topic: General - Call Back - No Documentation >> Oct 03, 2023 10:36 AM Zy'onna H wrote: Reason for CRM:  Patient called regarding a phone call he received this morning, stating something was at the office for him to pick up.  If this is regarding his 'Novo Nordisk ' he stated, that isn't supposed to be something he picks up, the form is supposed to be filled out by Gretel App, NP and sent directly to the manufacturer of 'Novo Nordisk ' for him to begin his treatment plan.   **I informed the patient that Gretel App, NP received the form this morning and should begin completing it soon.  He stated if this call was regarding his Rx: Rybelsus  Semaglutide  (RYBELSUS ) 7 MG TABS, please give him a call back and inform him which pharmacy it's been sent to for him to pick it up.

## 2023-10-03 NOTE — Telephone Encounter (Signed)
 Pt dropped off Novo Nordisk pt assistance paperwork to be filled out, it is in [employer] folder up front

## 2023-10-03 NOTE — Telephone Encounter (Signed)
 Pt informed that his sample was ready for pick per mychart msg in another note

## 2023-10-04 NOTE — Telephone Encounter (Signed)
 Pt picked meds up this morning

## 2023-10-06 ENCOUNTER — Encounter: Payer: Self-pay | Admitting: Nurse Practitioner

## 2023-10-10 ENCOUNTER — Telehealth: Payer: Self-pay | Admitting: Nurse Practitioner

## 2023-10-10 NOTE — Telephone Encounter (Signed)
 Called and informed pt that his patient assistance Rybelsus  is ready for pick up    MED: Rybelsus  7 mg QTY: 2 boxes LOT: [id number] EXP: 01-14-25  Boxes placed up front in designated pick up area

## 2023-10-10 NOTE — Telephone Encounter (Signed)
 FYI..Pt came into office and has picked up his Rx (Rybelsus ).

## 2023-10-17 NOTE — Telephone Encounter (Signed)
 Error

## 2023-10-22 ENCOUNTER — Encounter: Payer: Self-pay | Admitting: Pharmacist

## 2023-10-28 ENCOUNTER — Encounter: Payer: Self-pay | Admitting: Nurse Practitioner

## 2023-10-28 ENCOUNTER — Telehealth: Payer: Self-pay | Admitting: Nurse Practitioner

## 2023-10-28 NOTE — Telephone Encounter (Signed)
 Pt dropped off Pt Assistance paperwork to be filled out. It is in [employer] folder up front

## 2023-10-29 ENCOUNTER — Encounter: Payer: Self-pay | Admitting: Nurse Practitioner

## 2023-10-31 NOTE — Telephone Encounter (Signed)
 Faxed to [phone]

## 2023-11-01 ENCOUNTER — Other Ambulatory Visit: Payer: Self-pay | Admitting: Nurse Practitioner

## 2023-11-01 DIAGNOSIS — E119 Type 2 diabetes mellitus without complications: Secondary | ICD-10-CM

## 2023-11-05 ENCOUNTER — Telehealth: Payer: Self-pay

## 2023-11-05 NOTE — Telephone Encounter (Signed)
 Copied from CRM [id number]. Topic: General - Other >> Nov 05, 2023 10:58 AM Hamdi H wrote: Reason for CRM: Patient calling in stating on the Patient assitance program application form for [facility] the provider omitted some information on page 5 that they need for the medication JARDIANCE . The number for [facility] is: [phone], provider can call them to give the missing information. They need this information in order to move forward with the program.

## 2023-11-05 NOTE — Telephone Encounter (Signed)
 Called and they informed me that they would be sending the pt  a docu sign document to have him resign the form and they transferred me tot he pharmacy to calling the script of jardiance  and to update the new prescribing provider as they had Dr. Maribeth on the last script.    Called and informed pt to be on the look out for docu sign document

## 2023-11-13 ENCOUNTER — Encounter: Payer: Self-pay | Admitting: Nurse Practitioner

## 2023-11-15 ENCOUNTER — Telehealth: Payer: Self-pay

## 2023-11-15 NOTE — Telephone Encounter (Signed)
 NOVO NORDISK REFILL FORM PLACED IN PROVIDER TO BE SIGNED FOLDER

## 2023-11-20 NOTE — Telephone Encounter (Signed)
 Form faxed to novo nordisk [phone]

## 2023-11-24 ENCOUNTER — Encounter: Payer: Self-pay | Admitting: Nurse Practitioner

## 2023-11-27 ENCOUNTER — Ambulatory Visit: Admitting: Nurse Practitioner

## 2023-11-27 ENCOUNTER — Encounter: Payer: Self-pay | Admitting: Nurse Practitioner

## 2023-11-27 VITALS — BP 136/88 | HR 78 | Temp 97.9°F | Ht 68.0 in | Wt 191.4 lb

## 2023-11-27 DIAGNOSIS — E119 Type 2 diabetes mellitus without complications: Secondary | ICD-10-CM

## 2023-11-27 DIAGNOSIS — E785 Hyperlipidemia, unspecified: Secondary | ICD-10-CM

## 2023-11-27 DIAGNOSIS — I10 Essential (primary) hypertension: Secondary | ICD-10-CM | POA: Diagnosis not present

## 2023-11-27 DIAGNOSIS — N400 Enlarged prostate without lower urinary tract symptoms: Secondary | ICD-10-CM

## 2023-11-27 DIAGNOSIS — Z0000 Encounter for general adult medical examination without abnormal findings: Secondary | ICD-10-CM | POA: Diagnosis not present

## 2023-11-27 DIAGNOSIS — Z7984 Long term (current) use of oral hypoglycemic drugs: Secondary | ICD-10-CM

## 2023-11-27 LAB — COMPREHENSIVE METABOLIC PANEL WITH GFR
ALT: 22 U/L (ref 0–53)
AST: 20 U/L (ref 0–37)
Albumin: 4.9 g/dL (ref 3.5–5.2)
Alkaline Phosphatase: 59 U/L (ref 39–117)
BUN: 15 mg/dL (ref 6–23)
CO2: 31 meq/L (ref 19–32)
Calcium: 10.3 mg/dL (ref 8.4–10.5)
Chloride: 99 meq/L (ref 96–112)
Creatinine, Ser: 0.9 mg/dL (ref 0.40–1.50)
GFR: 82.44 mL/min (ref >60.00)
Glucose, Bld: 95 mg/dL (ref 70–99)
Potassium: 4 meq/L (ref 3.5–5.1)
Sodium: 139 meq/L (ref 135–145)
Total Bilirubin: 1.3 mg/dL — ABNORMAL HIGH (ref 0.2–1.2)
Total Protein: 7.5 g/dL (ref 6.0–8.3)

## 2023-11-27 LAB — HEMOGLOBIN A1C: Hgb A1c MFr Bld: 6.8 % — ABNORMAL HIGH (ref 4.6–6.5)

## 2023-11-27 NOTE — Progress Notes (Signed)
 Leron Glance, NP-C Phone: [phone]  Dakota Gonzalez is a 78 y.o. male who presents today for annual exam.   Discussed the use of AI scribe software for clinical note transcription with the patient, who gave verbal consent to proceed.  History of Present Illness   Dakota Gonzalez is a 78 year old male with type 2 diabetes mellitus who presents for a routine follow-up visit.  His blood sugar levels are generally within the normal range, typically between 100 to 120 mg/dL, although stress has occasionally caused fluctuations. He has been experiencing financial stress recently, which he believes has impacted his blood sugar control. No symptoms of excessive thirst, excessive urination, or hypoglycemia. He continues to take metformin  twice daily, Jardiance  once daily, and Rybelsus . His last A1c was 6.7% six months ago.  He is on Lipitor for cholesterol management and reports no issues with his blood pressure, which he checks occasionally and finds to be in the normal range, around 120/70 mmHg. He is not on any blood pressure medications.  For his prostate, he takes Flomax  and finasteride . He has started taking an over-the-counter supplement called 'super beta prostate' to reduce nocturia, which has improved his symptoms, allowing him to sleep for 6 to 8 hours without interruption.  No chest pain, shortness of breath, abdominal pain, urinary issues, headaches, dizziness, or joint pain. He reports no mood issues, anxiety, or depression affecting his daily life, although he acknowledges stress from recent life events. He sleeps well, averaging about 8 hours per night.  His social history includes no smoking, alcohol, or drug use. He exercises regularly using his home gym equipment and maintains a balanced diet, primarily eating at home. He reports having received the flu, COVID-19, pneumonia, RSV, and shingles vaccines.      Social History   Tobacco Use  Smoking Status Former  Smokeless  Tobacco Never    Current Outpatient Medications on File Prior to Visit  Medication Sig Dispense Refill   Accu-Chek Softclix Lancets lancets Use as instructed twice daily to check blood sugar. 200 each 3   aspirin 81 MG tablet Take 81 mg by mouth daily.     atorvastatin  (LIPITOR) 40 MG tablet TAKE 1 TABLET(40 MG) BY MOUTH DAILY 90 tablet 3   Coenzyme Q10 (COQ-10) 100 MG CAPS Take 1 tablet by mouth daily.     cyclobenzaprine  (FLEXERIL ) 10 MG tablet Take 1 tablet (10 mg total) by mouth every 8 (eight) hours as needed for muscle spasms (do not drive while using, may make you drowsy). 20 tablet 0   docusate sodium (COLACE) 100 MG capsule Take 100 mg by mouth daily as needed for mild constipation.     empagliflozin  (JARDIANCE ) 25 MG TABS tablet Take 1 tablet (25 mg total) by mouth daily. 90 tablet 3   finasteride  (PROSCAR ) 5 MG tablet TAKE 1 TABLET(5 MG) BY MOUTH DAILY 90 tablet 3   glucose blood (ACCU-CHEK GUIDE TEST) test strip Use as instructed to check blood glucose twice daily. 200 each 3   metFORMIN  (GLUCOPHAGE ) 500 MG tablet Take 2 tablets daily by mouth twice daily with a meal 360 tablet 3   Misc Natural Products (PROSTATE PO) Take by mouth daily.     Multiple Vitamins-Minerals (CENTRUM SILVER PO) Take 1 tablet by mouth daily.     Semaglutide  (RYBELSUS ) 7 MG TABS Take 1 tablet (7 mg total) by mouth daily. 30 tablet 0   tamsulosin  (FLOMAX ) 0.4 MG CAPS capsule Take 1 capsule (0.4 mg total) by  mouth daily. 90 capsule 3   No current facility-administered medications on file prior to visit.     ROS see history of present illness  Objective  Physical Exam Vitals:   11/27/23 1000  BP: 136/88  Pulse: 78  Temp: 97.9 F (36.6 C)  SpO2: 98%    BP Readings from Last 3 Encounters:  11/27/23 136/88  05/28/23 110/78  11/26/22 116/68   Wt Readings from Last 3 Encounters:  11/27/23 191 lb 6.4 oz (86.8 kg)  07/15/23 191 lb (86.6 kg)  05/28/23 186 lb (84.4 kg)    Physical  Exam Constitutional:      General: He is not in acute distress.    Appearance: Normal appearance.  HENT:     Head: Normocephalic.     Right Ear: Tympanic membrane normal.     Left Ear: Tympanic membrane normal.     Nose: Nose normal.     Mouth/Throat:     Mouth: Mucous membranes are moist.     Pharynx: Oropharynx is clear.  Eyes:     Conjunctiva/sclera: Conjunctivae normal.     Pupils: Pupils are equal, round, and reactive to light.  Neck:     Thyroid : No thyromegaly.  Cardiovascular:     Rate and Rhythm: Normal rate and regular rhythm.     Heart sounds: Normal heart sounds.  Pulmonary:     Effort: Pulmonary effort is normal.     Breath sounds: Normal breath sounds.  Abdominal:     General: Abdomen is flat. Bowel sounds are normal.     Palpations: Abdomen is soft. There is no mass.     Tenderness: There is no abdominal tenderness.  Musculoskeletal:        General: Normal range of motion.  Lymphadenopathy:     Cervical: No cervical adenopathy.  Skin:    General: Skin is warm and dry.     Findings: No rash.  Neurological:     General: No focal deficit present.     Mental Status: He is alert.  Psychiatric:        Mood and Affect: Mood normal.        Behavior: Behavior normal.      Assessment/Plan: Please see individual problem list.  Routine general medical examination at a health care facility Assessment & Plan: Physical exam complete. We will check lab work as outlined. Negative Cologuard in 2023. Aged out of PSA screening. Flu and tetanus vaccines are up to date. He declines additional COVID vaccines. Shingles, Pneumonia and RSV vaccine series are completed. Continue current lifestyle and exercise regimen, maintain a balanced diet, and continue regular eye and dental check-ups. Return to care in 6 months, sooner as needed.    Diabetes mellitus type 2, controlled (HCC) Assessment & Plan: Stressors are affecting blood sugar control, but levels are generally within  the normal range. A1c is at 6.7%. Current medications include metformin , Jardiance , and Rybelsus , with insurance coverage for Rybelsus  uncertain post-January 1st. Ordered an A1c test to assess current diabetes control. Continue current medication regimen. Discuss Rybelsus  alternatives if insurance coverage changes. Encourage healthy diet and regular exercise.   Orders: -     Hemoglobin A1c  Primary hypertension Assessment & Plan: Blood pressure is adequately controlled without medication. Advised to continue home monitoring and contact if consistently elevated.   Orders: -     Comprehensive metabolic panel with GFR  Hyperlipidemia, unspecified hyperlipidemia type Assessment & Plan: His hyperlipidemia is managed with atorvastatin , with no adverse symptoms. Continue  atorvastatin  40 mg daily. Encourage healthy diet and regular exercise.   Orders: -     Comprehensive metabolic panel with GFR  Benign prostatic hyperplasia without lower urinary tract symptoms Assessment & Plan: Improvement in nocturia with Flomax , finasteride , and Super Beta Prostate. Continue Flomax  and finasteride , and continue the over-the-counter Super Beta Prostate supplement.      Return in about 6 months (around 05/26/2024) for Follow up.   Leron Glance, NP-C [facility] [place]

## 2023-11-28 LAB — OPHTHALMOLOGY REPORT-SCANNED

## 2023-12-04 ENCOUNTER — Ambulatory Visit: Payer: Self-pay | Admitting: Nurse Practitioner

## 2023-12-05 ENCOUNTER — Encounter: Payer: Self-pay | Admitting: Nurse Practitioner

## 2023-12-05 NOTE — Telephone Encounter (Signed)
 Pt picked up medication.

## 2023-12-05 NOTE — Telephone Encounter (Signed)
 Patient Assistance meds received and pt informed via mychart (separate thread)    Med: Rybelsus  7 mg QTY: 4 boxes  LOT: [id number] EXP: 08-14-24  Meds placed in designated pick up area

## 2023-12-09 ENCOUNTER — Encounter: Payer: Self-pay | Admitting: Nurse Practitioner

## 2023-12-09 NOTE — Assessment & Plan Note (Signed)
 His hyperlipidemia is managed with atorvastatin , with no adverse symptoms. Continue atorvastatin  40 mg daily. Encourage healthy diet and regular exercise.

## 2023-12-09 NOTE — Assessment & Plan Note (Signed)
 Improvement in nocturia with Flomax , finasteride , and Super Beta Prostate. Continue Flomax  and finasteride , and continue the over-the-counter Super Beta Prostate supplement.

## 2023-12-09 NOTE — Assessment & Plan Note (Signed)
 Blood pressure is adequately controlled without medication. Advised to continue home monitoring and contact if consistently elevated.

## 2023-12-09 NOTE — Assessment & Plan Note (Signed)
 Stressors are affecting blood sugar control, but levels are generally within the normal range. A1c is at 6.7%. Current medications include metformin , Jardiance , and Rybelsus , with insurance coverage for Rybelsus  uncertain post-January 1st. Ordered an A1c test to assess current diabetes control. Continue current medication regimen. Discuss Rybelsus  alternatives if insurance coverage changes. Encourage healthy diet and regular exercise.

## 2023-12-09 NOTE — Assessment & Plan Note (Signed)
 Physical exam complete. We will check lab work as outlined. Negative Cologuard in 2023. Aged out of PSA screening. Flu and tetanus vaccines are up to date. He declines additional COVID vaccines. Shingles, Pneumonia and RSV vaccine series are completed. Continue current lifestyle and exercise regimen, maintain a balanced diet, and continue regular eye and dental check-ups. Return to care in 6 months, sooner as needed.

## 2024-05-26 ENCOUNTER — Ambulatory Visit: Admitting: Nurse Practitioner

## 2024-07-20 ENCOUNTER — Ambulatory Visit
